# Patient Record
Sex: Male | Born: 1950 | Race: White | Hispanic: No | Marital: Married | Smoking: Former smoker
Health system: Southern US, Community
[De-identification: ages and names within clinical notes are randomized; demographics above are authoritative.]

## PROBLEM LIST (undated history)

## (undated) DIAGNOSIS — J189 Pneumonia, unspecified organism: Secondary | ICD-10-CM

## (undated) DIAGNOSIS — G4730 Sleep apnea, unspecified: Secondary | ICD-10-CM

## (undated) DIAGNOSIS — I714 Abdominal aortic aneurysm, without rupture, unspecified: Secondary | ICD-10-CM

## (undated) DIAGNOSIS — E785 Hyperlipidemia, unspecified: Secondary | ICD-10-CM

## (undated) DIAGNOSIS — K219 Gastro-esophageal reflux disease without esophagitis: Secondary | ICD-10-CM

## (undated) DIAGNOSIS — M720 Palmar fascial fibromatosis [Dupuytren]: Secondary | ICD-10-CM

## (undated) DIAGNOSIS — G43909 Migraine, unspecified, not intractable, without status migrainosus: Secondary | ICD-10-CM

## (undated) DIAGNOSIS — M5030 Other cervical disc degeneration, unspecified cervical region: Secondary | ICD-10-CM

## (undated) DIAGNOSIS — I10 Essential (primary) hypertension: Secondary | ICD-10-CM

## (undated) DIAGNOSIS — M5020 Other cervical disc displacement, unspecified cervical region: Secondary | ICD-10-CM

## (undated) DIAGNOSIS — Z860100 Personal history of colon polyps, unspecified: Secondary | ICD-10-CM

## (undated) DIAGNOSIS — F419 Anxiety disorder, unspecified: Secondary | ICD-10-CM

## (undated) DIAGNOSIS — M1990 Unspecified osteoarthritis, unspecified site: Secondary | ICD-10-CM

## (undated) HISTORY — DX: Pneumonia, unspecified organism: J18.9

## (undated) HISTORY — DX: Sleep apnea, unspecified: G47.30

## (undated) HISTORY — PX: OTHER SURGICAL HISTORY: SHX169

## (undated) HISTORY — DX: Essential (primary) hypertension: I10

## (undated) HISTORY — DX: Personal history of colon polyps, unspecified: Z86.0100

## (undated) HISTORY — DX: Hyperlipidemia, unspecified: E78.5

## (undated) HISTORY — DX: Palmar fascial fibromatosis (dupuytren): M72.0

## (undated) HISTORY — DX: Abdominal aortic aneurysm, without rupture, unspecified: I71.40

## (undated) HISTORY — DX: Anxiety disorder, unspecified: F41.9

## (undated) HISTORY — DX: Unspecified osteoarthritis, unspecified site: M19.90

## (undated) HISTORY — DX: Migraine, unspecified, not intractable, without status migrainosus: G43.909

## (undated) HISTORY — DX: Personal history of colonic polyps: Z86.010

## (undated) HISTORY — DX: Abdominal aortic aneurysm, without rupture: I71.4

## (undated) HISTORY — DX: Gastro-esophageal reflux disease without esophagitis: K21.9

---

## 2001-05-04 ENCOUNTER — Encounter: Payer: Self-pay | Admitting: Internal Medicine

## 2001-05-04 ENCOUNTER — Encounter: Admission: RE | Admit: 2001-05-04 | Discharge: 2001-05-04 | Payer: Self-pay | Admitting: Internal Medicine

## 2005-11-17 ENCOUNTER — Encounter: Admission: RE | Admit: 2005-11-17 | Discharge: 2005-11-17 | Payer: Self-pay | Admitting: Internal Medicine

## 2014-05-24 ENCOUNTER — Other Ambulatory Visit: Payer: Self-pay | Admitting: Internal Medicine

## 2014-05-24 DIAGNOSIS — R1031 Right lower quadrant pain: Secondary | ICD-10-CM

## 2014-05-25 ENCOUNTER — Ambulatory Visit
Admission: RE | Admit: 2014-05-25 | Discharge: 2014-05-25 | Disposition: A | Discharge status: Home / Self Care | Payer: BC Managed Care – PPO | Source: Ambulatory Visit | Attending: Internal Medicine | Admitting: Internal Medicine

## 2014-05-25 DIAGNOSIS — R1031 Right lower quadrant pain: Secondary | ICD-10-CM

## 2014-05-25 MED ORDER — IOHEXOL 300 MG/ML  SOLN
125.0000 mL | Freq: Once | INTRAMUSCULAR | Status: AC | PRN
  Administered 2014-05-25: 125 mL via INTRAVENOUS

## 2015-04-29 DIAGNOSIS — Z6829 Body mass index (BMI) 29.0-29.9, adult: Secondary | ICD-10-CM | POA: Diagnosis not present

## 2015-04-29 DIAGNOSIS — K219 Gastro-esophageal reflux disease without esophagitis: Secondary | ICD-10-CM | POA: Insufficient documentation

## 2015-04-29 DIAGNOSIS — M720 Palmar fascial fibromatosis [Dupuytren]: Secondary | ICD-10-CM | POA: Diagnosis not present

## 2015-04-29 DIAGNOSIS — G43909 Migraine, unspecified, not intractable, without status migrainosus: Secondary | ICD-10-CM | POA: Insufficient documentation

## 2015-04-29 DIAGNOSIS — I714 Abdominal aortic aneurysm, without rupture, unspecified: Secondary | ICD-10-CM | POA: Insufficient documentation

## 2015-04-29 DIAGNOSIS — I10 Essential (primary) hypertension: Secondary | ICD-10-CM | POA: Insufficient documentation

## 2015-04-29 DIAGNOSIS — Z1389 Encounter for screening for other disorder: Secondary | ICD-10-CM | POA: Diagnosis not present

## 2015-04-29 DIAGNOSIS — E785 Hyperlipidemia, unspecified: Secondary | ICD-10-CM | POA: Insufficient documentation

## 2015-05-30 DIAGNOSIS — I714 Abdominal aortic aneurysm, without rupture: Secondary | ICD-10-CM | POA: Diagnosis not present

## 2015-06-12 ENCOUNTER — Encounter: Payer: Self-pay | Admitting: Surgery

## 2015-06-12 DIAGNOSIS — E785 Hyperlipidemia, unspecified: Secondary | ICD-10-CM | POA: Diagnosis not present

## 2015-06-12 DIAGNOSIS — Z23 Encounter for immunization: Secondary | ICD-10-CM | POA: Diagnosis not present

## 2015-07-10 ENCOUNTER — Encounter: Payer: Self-pay | Admitting: Surgery

## 2015-07-15 ENCOUNTER — Ambulatory Visit (INDEPENDENT_AMBULATORY_CARE_PROVIDER_SITE_OTHER): Payer: Medicare Other | Admitting: Surgery

## 2015-07-15 ENCOUNTER — Encounter: Payer: Self-pay | Admitting: Surgery

## 2015-07-15 VITALS — BP 161/94 | HR 66 | Wt 210.9 lb

## 2015-07-15 DIAGNOSIS — I714 Abdominal aortic aneurysm, without rupture, unspecified: Secondary | ICD-10-CM

## 2015-07-15 NOTE — Addendum Note (Signed)
Addended by: Dorthula Rue L on: 07/15/2015 11:05 AM   Modules accepted: Orders

## 2015-07-15 NOTE — Progress Notes (Signed)
Patient name: Keith Hernandez MRN: [id number] DOB: [birth year] Sex: male   Referred by: Dr. Ardeth Perfect  Reason for referral:  Chief Complaint  Patient presents with  . New Evaluation    eval AAA     HISTORY OF PRESENT ILLNESS: This is a very pleasant 64 year old gentleman who comes in today for evaluation of an abdominal aortic aneurysm.  This was initially detected in 2007 with a CT angiogram.  He had a follow-up CT scan in 2015.  This was done for abdominal pain and showed that the aneurysm was 2.9 cm.  He recently had an ultrasound which showed the aneurysm had grown 1 cm and now measured 3.9 cm.  The patient denies any abdominal pain.  He does not have a family history of premature cardiovascular disease.  The patient suffers from hypercholesterolemia.  He takes a statin 3 days a week.  He has decreased his statin dose because of leg weakness when he was taking it daily.  He suffers from hypertension, and particular whitecoat hypertension.  He is on an ACE inhibitor.  He has a record of his blood pressure which shows an average around 130/80.  He also takes a daily aspirin and multivitamin.  Past Medical History  Diagnosis Date  . AAA (abdominal aortic aneurysm) ([place])   . Hypertension   . Migraines   . GERD (gastroesophageal reflux disease)   . Dupuytren's disease   . Hyperlipidemia     Past Surgical History  Procedure Laterality Date  . Achilles reattachment      Social History   Social History  . Marital Status: Married    Spouse Name: N/A  . Number of Children: N/A  . Years of Education: N/A   Occupational History  . Not on file.   Social History Main Topics  . Smoking status: Former Smoker -- 1.00 packs/day    Types: Cigarettes    Quit date: 07/15/1999  . Smokeless tobacco: Never Used  . Alcohol Use: 4.2 oz/week    2 Shots of liquor, 5 Glasses of wine per week  . Drug Use: Not on file  . Sexual Activity: Not on file   Other Topics Concern  . Not on file     Social History Narrative    Family History  Problem Relation Age of Onset  . Arthritis Mother   . Heart disease Mother   . Heart attack Mother   . Asthma Father   . Asthma Paternal Grandfather     Allergies as of 07/15/2015  . (No Known Allergies)    Current Outpatient Prescriptions on File Prior to Visit  Medication Sig Dispense Refill  . atorvastatin (LIPITOR) 10 MG tablet Take 10 mg by mouth daily.    Marland Kitchen losartan (COZAAR) 50 MG tablet Take 50 mg by mouth daily.    . Multiple Vitamin (MULTIVITAMIN) tablet Take 1 tablet by mouth daily.     No current facility-administered medications on file prior to visit.     REVIEW OF SYSTEMS: Cardiovascular: No chest pain, chest pressure, palpitations, orthopnea, or dyspnea on exertion. No claudication or rest pain,  No history of DVT or phlebitis. Pulmonary: No productive cough, asthma or wheezing. Neurologic: No weakness, paresthesias, aphasia, or amaurosis. No dizziness. Hematologic: No bleeding problems or clotting disorders. Musculoskeletal: No joint pain or joint swelling. Gastrointestinal: No blood in stool or hematemesis Genitourinary: No dysuria or hematuria. Psychiatric:: No history of major depression. Integumentary: No rashes or ulcers. Constitutional: No fever or chills.  PHYSICAL EXAMINATION:  Filed Vitals:   07/15/15 0854 07/15/15 0903  BP: 169/87 161/94  Pulse: 66   Weight: 210 lb 14.4 oz (95.664 kg)   SpO2: 99%    There is no height on file to calculate BMI. General: The patient appears their stated age.   HEENT:  No gross abnormalities Pulmonary: Respirations are non-labored Abdomen: Soft and non-tender.  No pulsatile mass  Musculoskeletal: There are no major deformities.   Neurologic: No focal weakness or paresthesias are detected, Skin: There are no ulcer or rashes noted. Psychiatric: The patient has normal affect. Cardiovascular: There is a regular rate and rhythm without significant murmur  appreciated.  Palpable pedal pulses.  Palpable left popliteal pulse.  No carotid bruits  Diagnostic Studies: I have reviewed his CT scan from 2015 which shows a 2.9 cm abdominal aortic aneurysm at the aortic bifurcation.  No significant iliac ectasia or aneurysmal degeneration. Most recent ultrasound in 2016 shows an aneurysm of 3.9 cm   Assessment:  Abdominal aortic aneurysm Plan: We discussed the natural history of abdominal aortic aneurysmal disease.  We also discussed indications for repair.  I suspect that the most recent ultrasound was not entirely accurate.  He states that he was not nothing by mouth for the study and it was a difficult study.  I would like to repeat this in 6 months to make sure that it did not grow 1 cm a year.  He'll follow with me in 6 months with a repeat ultrasound.  He has no activity restrictions.  He is cleared for hand surgery when he elects to get this done at the beginning of the year.     Eldridge Abrahams, M.D. [facility] Office: [phone] Pager:  [phone]

## 2015-09-20 DIAGNOSIS — D485 Neoplasm of uncertain behavior of skin: Secondary | ICD-10-CM | POA: Diagnosis not present

## 2015-09-20 DIAGNOSIS — L989 Disorder of the skin and subcutaneous tissue, unspecified: Secondary | ICD-10-CM | POA: Insufficient documentation

## 2015-09-26 ENCOUNTER — Other Ambulatory Visit: Payer: Self-pay | Admitting: Orthopedic Surgery

## 2015-10-01 DIAGNOSIS — D230 Other benign neoplasm of skin of lip: Secondary | ICD-10-CM | POA: Diagnosis not present

## 2015-10-02 ENCOUNTER — Encounter (HOSPITAL_BASED_OUTPATIENT_CLINIC_OR_DEPARTMENT_OTHER): Payer: Self-pay

## 2015-10-02 ENCOUNTER — Ambulatory Visit (HOSPITAL_BASED_OUTPATIENT_CLINIC_OR_DEPARTMENT_OTHER): Admit: 2015-10-02 | Payer: Medicare Other | Admitting: Plastic Surgery

## 2015-10-02 SURGERY — EXCISION LIPOMA
Anesthesia: General | Site: Face

## 2015-10-03 ENCOUNTER — Encounter (HOSPITAL_BASED_OUTPATIENT_CLINIC_OR_DEPARTMENT_OTHER): Payer: Self-pay | Admitting: *Deleted

## 2015-10-03 NOTE — Pre-Procedure Instructions (Signed)
Vascular notes reviewed with Dr. Marin Comment. No further eval or testing needed. OK for surgery 10/11/15.

## 2015-10-11 ENCOUNTER — Ambulatory Visit (HOSPITAL_BASED_OUTPATIENT_CLINIC_OR_DEPARTMENT_OTHER)
Admission: RE | Admit: 2015-10-11 | Discharge: 2015-10-11 | Disposition: A | Discharge status: Home / Self Care | Payer: Medicare Other | Source: Ambulatory Visit | Attending: Orthopedic Surgery | Admitting: Orthopedic Surgery

## 2015-10-11 ENCOUNTER — Encounter (HOSPITAL_BASED_OUTPATIENT_CLINIC_OR_DEPARTMENT_OTHER): Payer: Self-pay | Admitting: Certified Registered"

## 2015-10-11 ENCOUNTER — Ambulatory Visit (HOSPITAL_BASED_OUTPATIENT_CLINIC_OR_DEPARTMENT_OTHER): Payer: Medicare Other | Admitting: Certified Registered"

## 2015-10-11 ENCOUNTER — Encounter (HOSPITAL_BASED_OUTPATIENT_CLINIC_OR_DEPARTMENT_OTHER)
Admission: RE | Disposition: A | Discharge status: Home / Self Care | Payer: Self-pay | Source: Ambulatory Visit | Attending: Orthopedic Surgery

## 2015-10-11 DIAGNOSIS — Z87891 Personal history of nicotine dependence: Secondary | ICD-10-CM | POA: Insufficient documentation

## 2015-10-11 DIAGNOSIS — M79642 Pain in left hand: Secondary | ICD-10-CM | POA: Diagnosis not present

## 2015-10-11 DIAGNOSIS — I10 Essential (primary) hypertension: Secondary | ICD-10-CM | POA: Diagnosis not present

## 2015-10-11 DIAGNOSIS — G8918 Other acute postprocedural pain: Secondary | ICD-10-CM | POA: Diagnosis not present

## 2015-10-11 DIAGNOSIS — M720 Palmar fascial fibromatosis [Dupuytren]: Secondary | ICD-10-CM | POA: Insufficient documentation

## 2015-10-11 DIAGNOSIS — E785 Hyperlipidemia, unspecified: Secondary | ICD-10-CM | POA: Insufficient documentation

## 2015-10-11 DIAGNOSIS — Z7982 Long term (current) use of aspirin: Secondary | ICD-10-CM | POA: Insufficient documentation

## 2015-10-11 DIAGNOSIS — K219 Gastro-esophageal reflux disease without esophagitis: Secondary | ICD-10-CM | POA: Diagnosis not present

## 2015-10-11 HISTORY — PX: FASCIOTOMY: SHX132

## 2015-10-11 SURGERY — FASCIOTOMY, UPPER EXTREMITY
Anesthesia: General | Site: Hand | Laterality: Left

## 2015-10-11 MED ORDER — FENTANYL CITRATE (PF) 100 MCG/2ML IJ SOLN
50.0000 ug | INTRAMUSCULAR | Status: DC | PRN
  Administered 2015-10-11: 50 ug via INTRAVENOUS

## 2015-10-11 MED ORDER — HYDROMORPHONE HCL 1 MG/ML IJ SOLN: 0.2500 mg | INTRAMUSCULAR | Status: DC | PRN

## 2015-10-11 MED ORDER — GLYCOPYRROLATE 0.2 MG/ML IJ SOLN: 0.2000 mg | Freq: Once | INTRAMUSCULAR | Status: DC | PRN

## 2015-10-11 MED ORDER — SCOPOLAMINE 1 MG/3DAYS TD PT72: 1.0000 | MEDICATED_PATCH | Freq: Once | TRANSDERMAL | Status: DC | PRN

## 2015-10-11 MED ORDER — CEFAZOLIN SODIUM-DEXTROSE 2-3 GM-% IV SOLR
2.0000 g | INTRAVENOUS | Status: AC
  Administered 2015-10-11: 2 g via INTRAVENOUS

## 2015-10-11 MED ORDER — ONDANSETRON HCL 4 MG/2ML IJ SOLN
INTRAMUSCULAR | Status: AC
  Filled 2015-10-11: qty 2

## 2015-10-11 MED ORDER — OXYCODONE HCL 5 MG PO TABS: 10.0000 mg | ORAL_TABLET | ORAL | Status: DC | PRN

## 2015-10-11 MED ORDER — MIDAZOLAM HCL 2 MG/2ML IJ SOLN
INTRAMUSCULAR | Status: AC
  Filled 2015-10-11: qty 2

## 2015-10-11 MED ORDER — LIDOCAINE-EPINEPHRINE (PF) 1.5 %-1:200000 IJ SOLN
INTRAMUSCULAR | Status: DC | PRN
  Administered 2015-10-11: 15 mL via PERINEURAL

## 2015-10-11 MED ORDER — LIDOCAINE HCL (CARDIAC) 20 MG/ML IV SOLN
INTRAVENOUS | Status: DC | PRN
  Administered 2015-10-11: 30 mg via INTRAVENOUS

## 2015-10-11 MED ORDER — CEFAZOLIN SODIUM-DEXTROSE 2-3 GM-% IV SOLR
INTRAVENOUS | Status: AC
  Filled 2015-10-11: qty 50

## 2015-10-11 MED ORDER — BUPIVACAINE HCL (PF) 0.5 % IJ SOLN
INTRAMUSCULAR | Status: DC | PRN
  Administered 2015-10-11: 25 mL via PERINEURAL

## 2015-10-11 MED ORDER — PROMETHAZINE HCL 25 MG/ML IJ SOLN: 6.2500 mg | INTRAMUSCULAR | Status: DC | PRN

## 2015-10-11 MED ORDER — CHLORHEXIDINE GLUCONATE 4 % EX LIQD: 60.0000 mL | Freq: Once | CUTANEOUS | Status: DC

## 2015-10-11 MED ORDER — FENTANYL CITRATE (PF) 100 MCG/2ML IJ SOLN
INTRAMUSCULAR | Status: AC
  Filled 2015-10-11: qty 2

## 2015-10-11 MED ORDER — KETOROLAC TROMETHAMINE 30 MG/ML IJ SOLN: 30.0000 mg | Freq: Once | INTRAMUSCULAR | Status: DC | PRN

## 2015-10-11 MED ORDER — LACTATED RINGERS IV SOLN
INTRAVENOUS | Status: DC
  Administered 2015-10-11 (×2): via INTRAVENOUS

## 2015-10-11 MED ORDER — BETAMETHASONE SOD PHOS & ACET 6 (3-3) MG/ML IJ SUSP
INTRAMUSCULAR | Status: AC
  Filled 2015-10-11: qty 1

## 2015-10-11 MED ORDER — DEXAMETHASONE SODIUM PHOSPHATE 10 MG/ML IJ SOLN
INTRAMUSCULAR | Status: DC | PRN
  Administered 2015-10-11: 10 mg via INTRAVENOUS

## 2015-10-11 MED ORDER — CEPHALEXIN 500 MG PO CAPS: 500.0000 mg | ORAL_CAPSULE | Freq: Four times a day (QID) | ORAL | Status: DC

## 2015-10-11 MED ORDER — BUPIVACAINE HCL (PF) 0.25 % IJ SOLN
INTRAMUSCULAR | Status: DC | PRN
  Administered 2015-10-11: 1.5 mL

## 2015-10-11 MED ORDER — PROPOFOL 10 MG/ML IV BOLUS
INTRAVENOUS | Status: DC | PRN
  Administered 2015-10-11: 200 mg via INTRAVENOUS

## 2015-10-11 MED ORDER — LIDOCAINE HCL (CARDIAC) 20 MG/ML IV SOLN
INTRAVENOUS | Status: AC
  Filled 2015-10-11: qty 5

## 2015-10-11 MED ORDER — DEXAMETHASONE SODIUM PHOSPHATE 10 MG/ML IJ SOLN
INTRAMUSCULAR | Status: AC
  Filled 2015-10-11: qty 1

## 2015-10-11 MED ORDER — ONDANSETRON HCL 4 MG/2ML IJ SOLN
INTRAMUSCULAR | Status: DC | PRN
  Administered 2015-10-11: 4 mg via INTRAVENOUS

## 2015-10-11 MED ORDER — MIDAZOLAM HCL 2 MG/2ML IJ SOLN
1.0000 mg | INTRAMUSCULAR | Status: DC | PRN
  Administered 2015-10-11: 2 mg via INTRAVENOUS

## 2015-10-11 SURGICAL SUPPLY — 65 items
BANDAGE ACE 3X5.8 VEL STRL LF (GAUZE/BANDAGES/DRESSINGS) ×2 IMPLANT
BANDAGE COBAN STERILE 2 (GAUZE/BANDAGES/DRESSINGS) ×2 IMPLANT
BLADE CLIPPER SURG (BLADE) IMPLANT
BLADE MINI RND TIP GREEN BEAV (BLADE) IMPLANT
BLADE SURG 15 STRL LF DISP TIS (BLADE) ×2 IMPLANT
BLADE SURG 15 STRL SS (BLADE) ×4
BNDG CONFORM 2 STRL LF (GAUZE/BANDAGES/DRESSINGS) IMPLANT
BNDG CONFORM 3 STRL LF (GAUZE/BANDAGES/DRESSINGS) ×2 IMPLANT
BNDG GAUZE ELAST 4 BULKY (GAUZE/BANDAGES/DRESSINGS) ×2 IMPLANT
BRUSH SCRUB EZ PLAIN DRY (MISCELLANEOUS) ×2 IMPLANT
CANISTER SUCT 1200ML W/VALVE (MISCELLANEOUS) IMPLANT
CORDS BIPOLAR (ELECTRODE) ×2 IMPLANT
COVER BACK TABLE 60X90IN (DRAPES) ×2 IMPLANT
CUFF TOURNIQUET SINGLE 18IN (TOURNIQUET CUFF) IMPLANT
DECANTER SPIKE VIAL GLASS SM (MISCELLANEOUS) IMPLANT
DRAIN JACKSON RD 7FR 3/32 (WOUND CARE) IMPLANT
DRAPE EXTREMITY T 121X128X90 (DRAPE) ×2 IMPLANT
DRAPE SURG 17X23 STRL (DRAPES) ×2 IMPLANT
DRSG EMULSION OIL 3X3 NADH (GAUZE/BANDAGES/DRESSINGS) ×4 IMPLANT
EVACUATOR SILICONE 100CC (DRAIN) IMPLANT
GAUZE SPONGE 4X4 16PLY XRAY LF (GAUZE/BANDAGES/DRESSINGS) ×2 IMPLANT
GAUZE XEROFORM 1X8 LF (GAUZE/BANDAGES/DRESSINGS) ×2 IMPLANT
GLOVE BIOGEL M STRL SZ7.5 (GLOVE) IMPLANT
GLOVE SS BIOGEL STRL SZ 8 (GLOVE) ×1 IMPLANT
GLOVE SUPERSENSE BIOGEL SZ 8 (GLOVE) ×1
GOWN STRL REUS W/ TWL LRG LVL3 (GOWN DISPOSABLE) ×1 IMPLANT
GOWN STRL REUS W/ TWL XL LVL3 (GOWN DISPOSABLE) ×1 IMPLANT
GOWN STRL REUS W/TWL LRG LVL3 (GOWN DISPOSABLE) ×2
GOWN STRL REUS W/TWL XL LVL3 (GOWN DISPOSABLE) ×2
NDL HYPO 25X1 1.5 SAFETY (NEEDLE) IMPLANT
NEEDLE HYPO 22GX1.5 SAFETY (NEEDLE) IMPLANT
NEEDLE HYPO 25X1 1.5 SAFETY (NEEDLE) IMPLANT
NS IRRIG 1000ML POUR BTL (IV SOLUTION) ×2 IMPLANT
PACK BASIN DAY SURGERY FS (CUSTOM PROCEDURE TRAY) ×2 IMPLANT
PAD CAST 3X4 CTTN HI CHSV (CAST SUPPLIES) ×1 IMPLANT
PADDING CAST ABS 3INX4YD NS (CAST SUPPLIES) ×1
PADDING CAST ABS 4INX4YD NS (CAST SUPPLIES) ×1
PADDING CAST ABS COTTON 3X4 (CAST SUPPLIES) ×1 IMPLANT
PADDING CAST ABS COTTON 4X4 ST (CAST SUPPLIES) ×1 IMPLANT
PADDING CAST COTTON 3X4 STRL (CAST SUPPLIES) ×2
PASSER SUT SWANSON 36MM LOOP (INSTRUMENTS) ×2 IMPLANT
SHEET MEDIUM DRAPE 40X70 STRL (DRAPES) ×2 IMPLANT
SPLINT FIBERGLASS 3X35 (CAST SUPPLIES) IMPLANT
SPLINT PLASTER CAST XFAST 3X15 (CAST SUPPLIES) IMPLANT
SPLINT PLASTER XTRA FASTSET 3X (CAST SUPPLIES)
STOCKINETTE 4X48 STRL (DRAPES) ×2 IMPLANT
STOCKINETTE SYNTHETIC 3 UNSTER (CAST SUPPLIES) ×2 IMPLANT
SUCTION FRAZIER HANDLE 10FR (MISCELLANEOUS)
SUCTION TUBE FRAZIER 10FR DISP (MISCELLANEOUS) IMPLANT
SUT MERSILENE 4 0 P 3 (SUTURE) IMPLANT
SUT MERSILENE 5 0 P 3 (SUTURE) IMPLANT
SUT PROLENE 4 0 PS 2 18 (SUTURE) IMPLANT
SUT PROLENE 5 0 P 3 (SUTURE) IMPLANT
SUT VIC AB 4-0 P-3 18XBRD (SUTURE) IMPLANT
SUT VIC AB 4-0 P3 18 (SUTURE)
SYR BULB 3OZ (MISCELLANEOUS) ×2 IMPLANT
SYR CONTROL 10ML LL (SYRINGE) IMPLANT
SYSTEM CHEST DRAIN TLS 7FR (DRAIN) ×1 IMPLANT
TAPE SURG TRANSPORE 1 IN (GAUZE/BANDAGES/DRESSINGS) ×1 IMPLANT
TAPE SURGICAL TRANSPORE 1 IN (GAUZE/BANDAGES/DRESSINGS) ×1
TOWEL OR 17X24 6PK STRL BLUE (TOWEL DISPOSABLE) ×2 IMPLANT
TOWEL OR NON WOVEN STRL DISP B (DISPOSABLE) ×2 IMPLANT
TRAY DSU PREP LF (CUSTOM PROCEDURE TRAY) ×2 IMPLANT
TUBE CONNECTING 20X1/4 (TUBING) IMPLANT
UNDERPAD 30X30 (UNDERPADS AND DIAPERS) ×2 IMPLANT

## 2015-10-11 NOTE — Transfer of Care (Signed)
Immediate Anesthesia Transfer of Care Note  Patient: Keith Hernandez  Procedure(s) Performed: Procedure(s): LEFT HAND SUBTOTAL PALMAR FASCIOTOMY WITH REPAIR RECONSTRUCTION  (Left)  Patient Location: PACU  Anesthesia Type:GA combined with regional for post-op pain  Level of Consciousness: awake, alert , oriented and patient cooperative  Airway & Oxygen Therapy: Patient Spontanous Breathing and Patient connected to face mask oxygen  Post-op Assessment: Report given to RN and Post -op Vital signs reviewed and stable  Post vital signs: Reviewed and stable  Last Vitals:  Filed Vitals:   10/11/15 0835 10/11/15 0840  BP:    Pulse: 86 83  Temp:    Resp: 21 16    Complications: No apparent anesthesia complications

## 2015-10-11 NOTE — Anesthesia Procedure Notes (Addendum)
Anesthesia Regional Block:  Axillary brachial plexus block  Pre-Anesthetic Checklist: ,, timeout performed, Correct Patient, Correct Site, Correct Laterality, Correct Procedure, Correct Position, site marked, Risks and benefits discussed,  Surgical consent,  Pre-op evaluation,  At surgeon's request and post-op pain management  Laterality: Left  Prep: chloraprep       Needles:  Injection technique: Single-shot  Needle Type: Echogenic Stimulator Needle     Needle Length: 5cm 5 cm Needle Gauge: 22 and 22 G    Additional Needles:  Procedures: ultrasound guided (picture in chart) Axillary brachial plexus block  Nerve Stimulator or Paresthesia:  Response: median, 0.4 mA,  Response: ulnar, 0.8 mA,   Additional Responses:   Narrative:  Injection made incrementally with aspirations every 5 mL.  Performed by: Personally  Anesthesiologist: ROSE, GEORGE  Additional Notes: Patient tolerated the procedure well without complications   Procedure Name: LMA Insertion Date/Time: 10/11/2015 9:44 AM Performed by: Mechelle Pates D Pre-anesthesia Checklist: Patient identified, Emergency Drugs available, Suction available and Patient being monitored Patient Re-evaluated:Patient Re-evaluated prior to inductionOxygen Delivery Method: Circle System Utilized Preoxygenation: Pre-oxygenation with 100% oxygen Intubation Type: IV induction Ventilation: Mask ventilation without difficulty LMA: LMA inserted LMA Size: 5.0 Number of attempts: 1 Airway Equipment and Method: Bite block Placement Confirmation: positive ETCO2 Tube secured with: Tape Dental Injury: Teeth and Oropharynx as per pre-operative assessment

## 2015-10-11 NOTE — Discharge Instructions (Signed)
Please elevate keep your hand and bandage clean and dry. Call office will call for your follow-up. He'll see our therapist in 5-7 days. He'll see Dr. Amedeo Plenty in 12 days. Please call for any problems.  Your operation with great!!  Keep bandage clean and dry.  Call for any problems.  No smoking.  Criteria for driving a car: you should be off your pain medicine for 7-8 hours, able to drive one handed(confident), thinking clearly and feeling able in your judgement to drive. Continue elevation as it will decrease swelling.  If instructed by MD move your fingers within the confines of the bandage/splint.  Use ice if instructed by your MD. Call immediately for any sudden loss of feeling in your hand/arm or change in functional abilities of the extremity.We recommend that you to take vitamin C 1000 mg a day to promote healing. We also recommend that if you require  pain medicine that you take a stool softener to prevent constipation as most pain medicines will have constipation side effects. We recommend either Peri-Colace or Senokot and recommend that you also consider adding MiraLAX to prevent the constipation affects from pain medicine if you are required to use them. These medicines are over the counter and maybe purchased at a local pharmacy. A cup of yogurt and a probiotic can also be helpful during the recovery process as the medicines can disrupt your intestinal environment.        HAND SURGERY    HOME CARE INSTRUCTIONS    The following instructions have been prepared to help you care for yourself upon your return home today.  Wound Care:  Keep your hand elevated above the level of your heart. Do not allow it to dangle by your side. Keep the dressing dry and do not remove it unless your doctor advises you to do so. He will usually change it at the time of you post-op visit. Moving your fingers is advised to stimulate circulation but will depend on the site of your surgery. Of course, if you have a  splint applied your doctor will advise you about movement.  Activity:  Do not drive or operate machinery today. Rest today and then you may return to your normal activity and work as indicated by your physician.  Diet: Drink liquids today or eat a light diet. You may resume a regular diet tomorrow.  General expectations: Pain for two or three days. Fingers may become slightly swollen.   Unexpected Observations- Call your doctor if any of these occur: Severe pain not relieved by pain medication. Elevated temperature. Dressing soaked with blood. Inability to move fingers. White or bluish color to fingers.   Post Anesthesia Home Care Instructions  Activity: Get plenty of rest for the remainder of the day. A responsible adult should stay with you for 24 hours following the procedure.  For the next 24 hours, DO NOT: -Drive a car -Paediatric nurse -Drink alcoholic beverages -Take any medication unless instructed by your physician -Make any legal decisions or sign important papers.  Meals: Start with liquid foods such as gelatin or soup. Progress to regular foods as tolerated. Avoid greasy, spicy, heavy foods. If nausea and/or vomiting occur, drink only clear liquids until the nausea and/or vomiting subsides. Call your physician if vomiting continues.  Special Instructions/Symptoms: Your throat may feel dry or sore from the anesthesia or the breathing tube placed in your throat during surgery. If this causes discomfort, gargle with warm salt water. The discomfort should disappear within 24  hours.  If you had a scopolamine patch placed behind your ear for the management of post- operative nausea and/or vomiting:  1. The medication in the patch is effective for 72 hours, after which it should be removed.  Wrap patch in a tissue and discard in the trash. Wash hands thoroughly with soap and water. 2. You may remove the patch earlier than 72 hours if you experience unpleasant side  effects which may include dry mouth, dizziness or visual disturbances. 3. Avoid touching the patch. Wash your hands with soap and water after contact with the patch.

## 2015-10-11 NOTE — Progress Notes (Signed)
Assisted Dr. Rose with left, axillary block. Side rails up, monitors on throughout procedure. See vital signs in flow sheet. Tolerated Procedure well. 

## 2015-10-11 NOTE — H&P (Signed)
Keith Hernandez is an 65 y.o. male.   Chief Complaint: Dupuytren's disease left hand with involvement of the ring and small finger HPI: Patient presents for Dupuytren's release and subtotal palmar fasciectomy left hand Patient presents for evaluation and treatment of the of their upper extremity predicament. The patient denies neck, back, chest or  abdominal pain. The patient notes that they have no lower extremity problems. The patients primary complaint is noted. We are planning surgical care pathway for the upper extremity. Past Medical History  Diagnosis Date  . AAA (abdominal aortic aneurysm) ([place])   . Hypertension   . Migraines   . GERD (gastroesophageal reflux disease)   . Dupuytren's disease   . Hyperlipidemia     Past Surgical History  Procedure Laterality Date  . Achilles reattachment      Family History  Problem Relation Age of Onset  . Arthritis Mother   . Heart disease Mother   . Heart attack Mother   . Asthma Father   . Asthma Paternal Grandfather    Social History:  reports that he quit smoking about 16 years ago. His smoking use included Cigarettes. He smoked 1.00 pack per day. He has never used smokeless tobacco. He reports that he drinks about 4.2 oz of alcohol per week. His drug history is not on file.  Allergies: No Known Allergies  Medications Prior to Admission  Medication Sig Dispense Refill  . aspirin 81 MG tablet Take 81 mg by mouth daily.    Marland Kitchen atorvastatin (LIPITOR) 10 MG tablet Take 10 mg by mouth daily.    Marland Kitchen losartan (COZAAR) 50 MG tablet Take 50 mg by mouth daily.    . Multiple Vitamin (MULTIVITAMIN) tablet Take 1 tablet by mouth daily.      No results found for this or any previous visit (from the past 48 hour(s)). No results found.  Review of Systems  Eyes: Negative.   Respiratory: Negative.   Cardiovascular: Negative.   Gastrointestinal: Negative.   Genitourinary: Negative.   Neurological: Negative.     Blood pressure 144/88, pulse 83,  temperature 98 F (36.7 C), temperature source Oral, resp. rate 16, height 6' (1.829 m), weight 94.915 kg (209 lb 4 oz), SpO2 100 %. Physical Exam  Dupuytren's disease left hand balding small and ring finger with contracture The patient is alert and oriented in no acute distress. The patient complains of pain in the affected upper extremity.  The patient is noted to have a normal HEENT exam. Lung fields show equal chest expansion and no shortness of breath. Abdomen exam is nontender without distention. Lower extremity examination does not show any fracture dislocation or blood clot symptoms. Pelvis is stable and the neck and back are stable and nontender. Assessment/Plan Plan left hand deep to arrange release with subtotal palmar fasciectomy We are planning surgery for your upper extremity. The risk and benefits of surgery to include risk of bleeding, infection, anesthesia,  damage to normal structures and failure of the surgery to accomplish its intended goals of relieving symptoms and restoring function have been discussed in detail. With this in mind we plan to proceed. I have specifically discussed with the patient the pre-and postoperative regime and the dos and don'ts and risk and benefits in great detail. Risk and benefits of surgery also include risk of dystrophy(CRPS), chronic nerve pain, failure of the healing process to go onto completion and other inherent risks of surgery The relavent the pathophysiology of the disease/injury process, as well as the  alternatives for treatment and postoperative course of action has been discussed in great detail with the patient who desires to proceed.  We will do everything in our power to help you (the patient) restore function to the upper extremity. It is a pleasure to see this patient today.  Paulene Floor 10/11/2015, 9:42 AM

## 2015-10-11 NOTE — Anesthesia Preprocedure Evaluation (Addendum)
Anesthesia Evaluation  Patient identified by MRN, date of birth, ID band Patient awake    Reviewed: Allergy & Precautions, NPO status , Patient's Chart, lab work & pertinent test results  Airway Mallampati: II  TM Distance: >3 FB Neck ROM: Full    Dental no notable dental hx.    Pulmonary neg pulmonary ROS, former smoker,    Pulmonary exam normal breath sounds clear to auscultation       Cardiovascular hypertension, Pt. on medications + Peripheral Vascular Disease  Normal cardiovascular exam Rhythm:Regular Rate:Normal  3.9 cm distal abdominal aneurysm    Neuro/Psych  Headaches, negative neurological ROS  negative psych ROS   GI/Hepatic Neg liver ROS, GERD  Medicated,  Endo/Other  negative endocrine ROS  Renal/GU negative Renal ROS  negative genitourinary   Musculoskeletal negative musculoskeletal ROS (+)   Abdominal   Peds negative pediatric ROS (+)  Hematology negative hematology ROS (+)   Anesthesia Other Findings   Reproductive/Obstetrics negative OB ROS                           Anesthesia Physical Anesthesia Plan  ASA: III  Anesthesia Plan: General   Post-op Pain Management: GA combined w/ Regional for post-op pain   Induction: Intravenous  Airway Management Planned: LMA  Additional Equipment:   Intra-op Plan:   Post-operative Plan:   Informed Consent: I have reviewed the patients History and Physical, chart, labs and discussed the procedure including the risks, benefits and alternatives for the proposed anesthesia with the patient or authorized representative who has indicated his/her understanding and acceptance.   Dental advisory given  Plan Discussed with: CRNA and Surgeon  Anesthesia Plan Comments:        Anesthesia Quick Evaluation

## 2015-10-11 NOTE — Op Note (Signed)
See dictation [place].

## 2015-10-11 NOTE — Anesthesia Postprocedure Evaluation (Signed)
Anesthesia Post Note  Patient: Keith Hernandez  Procedure(s) Performed: Procedure(s) (LRB): LEFT HAND SUBTOTAL PALMAR FASCIOTOMY WITH REPAIR RECONSTRUCTION  (Left)  Patient location during evaluation: PACU Anesthesia Type: General and Regional Level of consciousness: awake and alert Pain management: pain level controlled Vital Signs Assessment: post-procedure vital signs reviewed and stable Respiratory status: spontaneous breathing, nonlabored ventilation, respiratory function stable and patient connected to nasal cannula oxygen Cardiovascular status: blood pressure returned to baseline and stable Postop Assessment: no signs of nausea or vomiting Anesthetic complications: no    Last Vitals:  Filed Vitals:   10/11/15 1145 10/11/15 1200  BP: 150/100 156/90  Pulse: 94 86  Temp:  36.4 C  Resp: 18 18    Last Pain:  Filed Vitals:   10/11/15 1209  PainSc: 0-No pain                 Talana Slatten S

## 2015-10-14 ENCOUNTER — Encounter (HOSPITAL_BASED_OUTPATIENT_CLINIC_OR_DEPARTMENT_OTHER): Payer: Self-pay | Admitting: Orthopedic Surgery

## 2015-10-14 NOTE — Op Note (Signed)
NAME:  Keith Hernandez, PLUMBER NO.:  [id number]  MEDICAL RECORD NO.:  [id number]  LOCATION:                                 FACILITY:  PHYSICIAN:  Satira Anis. Amedeo Plenty, M.D.     DATE OF BIRTH:  DATE OF PROCEDURE:  10/11/2015 DATE OF DISCHARGE:                              OPERATIVE REPORT   PREOPERATIVE DIAGNOSIS:  Left hand advanced Dupuytren's contracture involving the ring and small fingers.  POSTOPERATIVE DIAGNOSIS:  Left hand advanced Dupuytren's contracture involving the ring and small fingers.  PROCEDURE: 1. Subtotal palmar fasciectomy, right small finger and palm. 2. Ulnar digital nerve neurolysis, extensive in nature, left small     finger. 3. Radial digital nerve neurolysis, extensive in nature, left small     finger. 4. Subtotal palmar fasciectomy, ring finger and palm, left hand.  SURGEON:  Satira Anis. Amedeo Plenty, MD  ASSISTANT:  None.  COMPLICATIONS:  None.  ANESTHESIA:  General, preoperative block.  TOURNIQUET TIME:  Less than an hour.  DRAINS:  One TLS 7 drain.  INDICATIONS:  Patient is a pleasant 65 year old male now presents with the above-mentioned diagnosis.  I have counseled him in regard to risks and benefits of surgery and he desires to proceed.  All questions have been encouraged, answered preoperatively.  OPERATIVE PROCEDURE:  The patient was seen by myself and Anesthesia, taken to operative suite, underwent smooth induction with general anesthetic, prepped and draped in usual sterile fashion with Betadine scrub and paint.  SCDs were placed.  Body parts well padded.  Preop time- out was accomplished and observed and preoperative antibiotics were given.  Following this, modified Bruner incision was made from the area distal to the PIP joint, a small finger proximally incorporated in an open McCash limb as the patient had severe dimpling about the palmar aspect of his hand.  Skin flaps were gently elevated.  Very careful tangential sweep  to the knife blade.  Under 4.0 loupe magnification, the skin flaps were elevated.  At this time, I identified the neurovascular bundles.  The radial and ulnar neurovascular bundles were identified with her concomitant arterial branches.  I performed a neurolysis of the radial and ulnar digital nerves, extensive in nature.  I removed the spinal cord and the natatory cords and this entangled the neurovascular bundles from the disease process.  Following this, the pretendinous cord, natatory cord, and spiral cords were removed in their entirety and sent for specimen.  Following this, I then dissected radially and identified the pretendinous cord to the ring finger, this was a less involved contracture and it was removed in its entirety very carefully protecting neurovascular structures.  I did not have to perform any major neurolysis of the nerves in the ring finger.  Following this, I deflated the tourniquet.  Hemostasis was secured with bipolar electrocautery as needed.  Drain was placed in the form of TLS drain.  The wounds were closed with a horizontal mattress through 4-0 Prolene stitch.  The patient tolerated this well and had excellent refill, no complicating features.  Standard Dupuytren's dressing and splint were applied.  He will see [place] in 5-7 days and begin therapeutic endeavors  in our therapy office.  He will see me formally in 12 days.  These notes discussed.  Keflex, oxycodone antibiotic prophylaxis and pain control.  Pleasure to see him today.  Do's and don'ts discussed and all questions addressed.     Satira Anis. Amedeo Plenty, M.D.     [facility]  D:  10/11/2015  T:  10/11/2015  Job:  [id number]

## 2015-10-25 DIAGNOSIS — M720 Palmar fascial fibromatosis [Dupuytren]: Secondary | ICD-10-CM | POA: Diagnosis not present

## 2015-10-25 DIAGNOSIS — Z4789 Encounter for other orthopedic aftercare: Secondary | ICD-10-CM | POA: Diagnosis not present

## 2015-10-30 DIAGNOSIS — M720 Palmar fascial fibromatosis [Dupuytren]: Secondary | ICD-10-CM | POA: Diagnosis not present

## 2015-11-06 DIAGNOSIS — M720 Palmar fascial fibromatosis [Dupuytren]: Secondary | ICD-10-CM | POA: Diagnosis not present

## 2015-11-13 DIAGNOSIS — M720 Palmar fascial fibromatosis [Dupuytren]: Secondary | ICD-10-CM | POA: Diagnosis not present

## 2015-11-13 DIAGNOSIS — Z4789 Encounter for other orthopedic aftercare: Secondary | ICD-10-CM | POA: Diagnosis not present

## 2015-11-22 ENCOUNTER — Telehealth: Payer: Self-pay | Admitting: Internal Medicine

## 2015-11-22 NOTE — Telephone Encounter (Signed)
Called and spoke to pt. Pt is requesting Consult appt with CY for cough and chest congestion x 2 weeks. Pt is requesting an appt with CY within the next 2 weeks if possible.   Spoke with CY and was advised that he would be best worked in within another provider as the pt would be seen sooner if by another provider.   Called and spoke to pt. Informed him of the recs per CY. Appt made with MW on 3.13.17. Pt verbalized understanding and denied any further questions or concerns at this time.

## 2015-11-25 ENCOUNTER — Institutional Professional Consult (permissible substitution): Payer: Medicare Other | Admitting: Internal Medicine

## 2015-11-26 DIAGNOSIS — H6981 Other specified disorders of Eustachian tube, right ear: Secondary | ICD-10-CM | POA: Insufficient documentation

## 2015-11-26 DIAGNOSIS — J302 Other seasonal allergic rhinitis: Secondary | ICD-10-CM | POA: Insufficient documentation

## 2015-11-26 DIAGNOSIS — H9313 Tinnitus, bilateral: Secondary | ICD-10-CM | POA: Diagnosis not present

## 2015-12-30 DIAGNOSIS — Z6829 Body mass index (BMI) 29.0-29.9, adult: Secondary | ICD-10-CM | POA: Diagnosis not present

## 2015-12-30 DIAGNOSIS — J302 Other seasonal allergic rhinitis: Secondary | ICD-10-CM | POA: Diagnosis not present

## 2015-12-30 DIAGNOSIS — J45998 Other asthma: Secondary | ICD-10-CM | POA: Diagnosis not present

## 2015-12-30 DIAGNOSIS — J3489 Other specified disorders of nose and nasal sinuses: Secondary | ICD-10-CM | POA: Diagnosis not present

## 2016-01-01 DIAGNOSIS — E784 Other hyperlipidemia: Secondary | ICD-10-CM | POA: Diagnosis not present

## 2016-01-01 DIAGNOSIS — I10 Essential (primary) hypertension: Secondary | ICD-10-CM | POA: Diagnosis not present

## 2016-01-01 DIAGNOSIS — Z125 Encounter for screening for malignant neoplasm of prostate: Secondary | ICD-10-CM | POA: Diagnosis not present

## 2016-01-07 ENCOUNTER — Encounter: Payer: Self-pay | Admitting: Surgery

## 2016-01-08 DIAGNOSIS — Z1159 Encounter for screening for other viral diseases: Secondary | ICD-10-CM | POA: Diagnosis not present

## 2016-01-08 DIAGNOSIS — G43909 Migraine, unspecified, not intractable, without status migrainosus: Secondary | ICD-10-CM | POA: Diagnosis not present

## 2016-01-08 DIAGNOSIS — I714 Abdominal aortic aneurysm, without rupture: Secondary | ICD-10-CM | POA: Diagnosis not present

## 2016-01-08 DIAGNOSIS — K219 Gastro-esophageal reflux disease without esophagitis: Secondary | ICD-10-CM | POA: Diagnosis not present

## 2016-01-08 DIAGNOSIS — I10 Essential (primary) hypertension: Secondary | ICD-10-CM | POA: Diagnosis not present

## 2016-01-08 DIAGNOSIS — Z0000 Encounter for general adult medical examination without abnormal findings: Secondary | ICD-10-CM | POA: Diagnosis not present

## 2016-01-08 DIAGNOSIS — J329 Chronic sinusitis, unspecified: Secondary | ICD-10-CM | POA: Diagnosis not present

## 2016-01-08 DIAGNOSIS — Z23 Encounter for immunization: Secondary | ICD-10-CM | POA: Diagnosis not present

## 2016-01-08 DIAGNOSIS — Z6829 Body mass index (BMI) 29.0-29.9, adult: Secondary | ICD-10-CM | POA: Diagnosis not present

## 2016-01-08 DIAGNOSIS — E784 Other hyperlipidemia: Secondary | ICD-10-CM | POA: Diagnosis not present

## 2016-01-13 ENCOUNTER — Ambulatory Visit (HOSPITAL_COMMUNITY)
Admission: RE | Admit: 2016-01-13 | Discharge: 2016-01-13 | Disposition: A | Discharge status: Home / Self Care | Payer: Medicare Other | Source: Ambulatory Visit | Attending: Surgery | Admitting: Surgery

## 2016-01-13 ENCOUNTER — Encounter: Payer: Self-pay | Admitting: Surgery

## 2016-01-13 ENCOUNTER — Ambulatory Visit (INDEPENDENT_AMBULATORY_CARE_PROVIDER_SITE_OTHER): Payer: Medicare Other | Admitting: Surgery

## 2016-01-13 VITALS — BP 148/100 | HR 69 | Ht 72.0 in | Wt 213.7 lb

## 2016-01-13 DIAGNOSIS — E785 Hyperlipidemia, unspecified: Secondary | ICD-10-CM | POA: Insufficient documentation

## 2016-01-13 DIAGNOSIS — I714 Abdominal aortic aneurysm, without rupture, unspecified: Secondary | ICD-10-CM

## 2016-01-13 DIAGNOSIS — K219 Gastro-esophageal reflux disease without esophagitis: Secondary | ICD-10-CM | POA: Diagnosis not present

## 2016-01-13 DIAGNOSIS — I10 Essential (primary) hypertension: Secondary | ICD-10-CM | POA: Insufficient documentation

## 2016-01-13 NOTE — Progress Notes (Signed)
   Patient name: Keith Hernandez MRN: [id number] DOB: 07/20/1951 Sex: male  REASON FOR VISIT: follow up  HPI: Keith Hernandez is a 65 y.o. male who is here for follow-up of a abdominal aortic aneurysm.  This was initially detected in 2007 with a CT angiogram.  He had a follow-up CT scan in 2015 which was done for abdominal pain.  At that time his aneurysm measured 2.9 cm.  A ultrasound in 2016 showed a 3.9 cm aneurysm, indicating 1 cm of growth within a year.  I was uncomfortable about the measurements on his follow-up ultrasound and therefore wanted to have it repeated.  He is back today for that study.  He denies any abdominal pain.  The patient suffers from hypercholesterolemia. He takes a statin 3 days a week. He has decreased his statin dose because of leg weakness when he was taking it daily. He suffers from hypertension, and particular whitecoat hypertension. He is on an ACE inhibitor. He has a record of his blood pressure which shows an average around 130/80. He also takes a daily aspirin and multivitamin.  Current Outpatient Prescriptions  Medication Sig Dispense Refill  . aspirin 81 MG tablet Take 81 mg by mouth daily.    Marland Kitchen atorvastatin (LIPITOR) 10 MG tablet Take 10 mg by mouth daily.    . cephALEXin (KEFLEX) 500 MG capsule Take 1 capsule (500 mg total) by mouth 4 (four) times daily. 40 capsule 0  . losartan (COZAAR) 50 MG tablet Take 50 mg by mouth daily.    . mometasone (NASONEX) 50 MCG/ACT nasal spray Place into the nose.    . Multiple Vitamin (MULTIVITAMIN) tablet Take 1 tablet by mouth daily.    Marland Kitchen oxyCODONE (OXY IR/ROXICODONE) 5 MG immediate release tablet Take 2 tablets (10 mg total) by mouth every 4 (four) hours as needed for severe pain. (Patient not taking: Reported on 01/13/2016) 45 tablet 0   No current facility-administered medications for this visit.    REVIEW OF SYSTEMS:  [X]  denotes positive finding, [ ]  denotes negative finding Cardiac  Comments:  Chest pain or chest  pressure:    Shortness of breath upon exertion:    Short of breath when lying flat:    Irregular heart rhythm:    Constitutional    Fever or chills:      PHYSICAL EXAM: Filed Vitals:   01/13/16 0837 01/13/16 0839  BP: 149/98 148/100  Pulse: 69   Height: 6' (1.829 m)   Weight: 213 lb 11.2 oz (96.934 kg)   SpO2: 97%     GENERAL: The patient is a well-nourished male, in no acute distress. The vital signs are documented above. CARDIOVASCULAR: There is a regular rate and rhythm. PULMONARY: There is good air exchange bilaterally without wheezing or rales. Neck: No carotid bruits Abdomen: No tenderness.  No pulsatile mass. Extremities: Palpable dorsalis pedis pulse bilaterally.  Data: I have ordered and reviewed the patient's ultrasound.  Aneurysmal diameter is 3.2  MEDICAL ISSUES: Imaging studies today reveal a 3.2 cm aneurysm.  This is approximately a 3 mm growth over the course of 18 months.  This would be the expected diameter increase.  I discussed this with the patient.  We discussed proceeding with repair once the maximum diameter is greater than 5 cm.  He will continue with annual surveillance.  His next study will be in 1 year.  Annamarie Major [facility] [employer]: [phone]

## 2016-01-17 DIAGNOSIS — Z1212 Encounter for screening for malignant neoplasm of rectum: Secondary | ICD-10-CM | POA: Diagnosis not present

## 2016-02-18 NOTE — Addendum Note (Signed)
Addended by: Thresa Ross C on: 02/18/2016 12:37 PM   Modules accepted: Orders

## 2016-06-18 DIAGNOSIS — Z23 Encounter for immunization: Secondary | ICD-10-CM | POA: Diagnosis not present

## 2016-09-29 DIAGNOSIS — M2241 Chondromalacia patellae, right knee: Secondary | ICD-10-CM | POA: Diagnosis not present

## 2016-10-05 DIAGNOSIS — J189 Pneumonia, unspecified organism: Secondary | ICD-10-CM | POA: Diagnosis not present

## 2016-10-05 DIAGNOSIS — R05 Cough: Secondary | ICD-10-CM | POA: Diagnosis not present

## 2016-10-05 DIAGNOSIS — Z6829 Body mass index (BMI) 29.0-29.9, adult: Secondary | ICD-10-CM | POA: Diagnosis not present

## 2016-10-12 DIAGNOSIS — M720 Palmar fascial fibromatosis [Dupuytren]: Secondary | ICD-10-CM | POA: Diagnosis not present

## 2017-01-05 DIAGNOSIS — E784 Other hyperlipidemia: Secondary | ICD-10-CM | POA: Diagnosis not present

## 2017-01-05 DIAGNOSIS — I10 Essential (primary) hypertension: Secondary | ICD-10-CM | POA: Diagnosis not present

## 2017-01-05 DIAGNOSIS — Z125 Encounter for screening for malignant neoplasm of prostate: Secondary | ICD-10-CM | POA: Diagnosis not present

## 2017-01-12 DIAGNOSIS — E559 Vitamin D deficiency, unspecified: Secondary | ICD-10-CM | POA: Insufficient documentation

## 2017-01-12 DIAGNOSIS — Z1389 Encounter for screening for other disorder: Secondary | ICD-10-CM | POA: Diagnosis not present

## 2017-01-12 DIAGNOSIS — Z0000 Encounter for general adult medical examination without abnormal findings: Secondary | ICD-10-CM | POA: Diagnosis not present

## 2017-01-12 DIAGNOSIS — Z23 Encounter for immunization: Secondary | ICD-10-CM | POA: Diagnosis not present

## 2017-01-12 DIAGNOSIS — Z6829 Body mass index (BMI) 29.0-29.9, adult: Secondary | ICD-10-CM | POA: Diagnosis not present

## 2017-01-12 DIAGNOSIS — I10 Essential (primary) hypertension: Secondary | ICD-10-CM | POA: Diagnosis not present

## 2017-01-12 DIAGNOSIS — M1612 Unilateral primary osteoarthritis, left hip: Secondary | ICD-10-CM | POA: Diagnosis not present

## 2017-01-12 DIAGNOSIS — M720 Palmar fascial fibromatosis [Dupuytren]: Secondary | ICD-10-CM | POA: Diagnosis not present

## 2017-01-12 DIAGNOSIS — K219 Gastro-esophageal reflux disease without esophagitis: Secondary | ICD-10-CM | POA: Diagnosis not present

## 2017-01-12 DIAGNOSIS — E784 Other hyperlipidemia: Secondary | ICD-10-CM | POA: Diagnosis not present

## 2017-01-12 DIAGNOSIS — G43909 Migraine, unspecified, not intractable, without status migrainosus: Secondary | ICD-10-CM | POA: Diagnosis not present

## 2017-01-12 DIAGNOSIS — R5383 Other fatigue: Secondary | ICD-10-CM | POA: Diagnosis not present

## 2017-01-12 DIAGNOSIS — I714 Abdominal aortic aneurysm, without rupture: Secondary | ICD-10-CM | POA: Diagnosis not present

## 2017-01-13 DIAGNOSIS — Z1212 Encounter for screening for malignant neoplasm of rectum: Secondary | ICD-10-CM | POA: Diagnosis not present

## 2017-01-20 ENCOUNTER — Encounter: Payer: Self-pay | Admitting: Family

## 2017-01-25 ENCOUNTER — Encounter: Payer: Self-pay | Admitting: Family

## 2017-01-25 ENCOUNTER — Ambulatory Visit (INDEPENDENT_AMBULATORY_CARE_PROVIDER_SITE_OTHER): Payer: Medicare Other | Admitting: Family

## 2017-01-25 ENCOUNTER — Ambulatory Visit (HOSPITAL_COMMUNITY)
Admission: RE | Admit: 2017-01-25 | Discharge: 2017-01-25 | Disposition: A | Discharge status: Home / Self Care | Payer: Medicare Other | Source: Ambulatory Visit | Attending: Family | Admitting: Family

## 2017-01-25 VITALS — BP 167/101 | HR 65 | Temp 97.3°F | Resp 18 | Ht 72.0 in | Wt 214.0 lb

## 2017-01-25 DIAGNOSIS — I714 Abdominal aortic aneurysm, without rupture, unspecified: Secondary | ICD-10-CM

## 2017-01-25 DIAGNOSIS — Z87891 Personal history of nicotine dependence: Secondary | ICD-10-CM

## 2017-01-25 DIAGNOSIS — R0989 Other specified symptoms and signs involving the circulatory and respiratory systems: Secondary | ICD-10-CM | POA: Diagnosis not present

## 2017-01-25 DIAGNOSIS — R140 Abdominal distension (gaseous): Secondary | ICD-10-CM | POA: Insufficient documentation

## 2017-01-25 NOTE — Progress Notes (Signed)
[facility]    CC: Follow up Abdominal Aortic Aneurysm  History of Present Illness  Keith Hernandez is a 66 y.o. ([birth year]) male who is here for follow-up of a abdominal aortic aneurysm.  This was initially detected in 2007 with a CT angiogram.  He had a follow-up CT scan in 2015 which was done for abdominal pain.  At that time his aneurysm measured 2.9 cm.  An ultrasound in 2016 showed a 3.9 cm aneurysm, indicating 1 cm of growth within a year.  Dr. Trula Slade was uncomfortable about the measurements on his follow-up ultrasound and therefore wanted to have it repeated; that duplex measured 3.24.cm.  He denies any abdominal pain or back pain.  He denies any known history of stroke or TIA. He denies any claudication symptoms with walking. He denies any history of cardiac problems other than when he was drinking too much coffee he had periods of palpations. The palpitations stopped when he stopped drinking coffee.   The patient suffers from hypercholesterolemia. He takes a statin 3 days a week. He has decreased his statin dose because of leg weakness when he was taking it daily.He also takes a daily aspirin and multivitamin.  Pt states that he has white coat syndrome in re to his hypertension. He states his blood pressure medication dose frequency has been increased this last week. He states at home, his blood pressure in the last 2 days has been 120's/70's.    Past Medical History:  Diagnosis Date  . AAA (abdominal aortic aneurysm) ([place])   . Dupuytren's disease   . GERD (gastroesophageal reflux disease)   . Hyperlipidemia   . Hypertension   . Migraines    Past Surgical History:  Procedure Laterality Date  . achilles reattachment    . FASCIOTOMY Left 10/11/2015   Procedure: LEFT HAND SUBTOTAL PALMAR FASCIOTOMY WITH REPAIR RECONSTRUCTION ;  Surgeon: Roseanne Kaufman, MD;  Location: [facility];  Service: Orthopedics;  Laterality: Left;   Social  History Social History   Social History  . Marital status: Married    Spouse name: N/A  . Number of children: N/A  . Years of education: N/A   Occupational History  . Not on file.   Social History Main Topics  . Smoking status: Former Smoker    Packs/day: 1.00    Types: Cigarettes    Quit date: 07/15/1999  . Smokeless tobacco: Never Used  . Alcohol use 4.2 oz/week    5 Glasses of wine, 2 Shots of liquor per week  . Drug use: Unknown  . Sexual activity: Not on file   Other Topics Concern  . Not on file   Social History Narrative  . No narrative on file   Family History Family History  Problem Relation Age of Onset  . Arthritis Mother   . Heart disease Mother   . Heart attack Mother   . Asthma Father   . Asthma Paternal Grandfather     Current Outpatient Prescriptions on File Prior to Visit  Medication Sig Dispense Refill  . aspirin 81 MG tablet Take 81 mg by mouth daily.    Marland Kitchen atorvastatin (LIPITOR) 10 MG tablet Take 10 mg by mouth daily.    Marland Kitchen losartan (COZAAR) 50 MG tablet Take 50 mg by mouth 2 (two) times daily.     . mometasone (NASONEX) 50 MCG/ACT nasal spray Place into the nose.    . Multiple Vitamin (MULTIVITAMIN) tablet Take 1 tablet by mouth daily.    Marland Kitchen  cephALEXin (KEFLEX) 500 MG capsule Take 1 capsule (500 mg total) by mouth 4 (four) times daily. (Patient not taking: Reported on 01/25/2017) 40 capsule 0  . oxyCODONE (OXY IR/ROXICODONE) 5 MG immediate release tablet Take 2 tablets (10 mg total) by mouth every 4 (four) hours as needed for severe pain. (Patient not taking: Reported on 01/13/2016) 45 tablet 0   No current facility-administered medications on file prior to visit.    No Known Allergies  ROS: See HPI for pertinent positives and negatives.  Physical Examination  Vitals:   01/25/17 0841 01/25/17 0843  BP: (!) 161/96 (!) 167/101  Pulse: 65   Resp: 18   Temp: 97.3 F (36.3 C)   TempSrc: Oral   SpO2: 98%   Weight: 214 lb (97.1 kg)   Height:  6' (1.829 m)    Body mass index is 29.02 kg/m.  General: A&O x 3, WD, overweight male.  Pulmonary: Sym exp, respirations are non labored, good air movt, CTAB, no rales, rhonchi, or wheezing.  Cardiac: RRR, Nl S1, S2, no detected murmur.   Carotid Bruits Right Left   Negative Negative    Abdominal aorta is not palpable Radial pulses are 2+ palpable bilaterally                          VASCULAR EXAM:                                                                                                         LE Pulses Right Left       FEMORAL  not palpable  not palpable        POPLITEAL  3+ palpable   2+ palpable       POSTERIOR TIBIAL  1+ palpable   1+ palpable        DORSALIS PEDIS      ANTERIOR TIBIAL 2+ palpable  2+ palpable      Gastrointestinal: soft, NTND, -G/R, - HSM, - masses palpated, - CVAT B.  Musculoskeletal: M/S 5/5 throughout, Extremities without ischemic changes.  Neurologic: CN 2-12 intact, Pain and light touch intact in extremities are intact, Motor exam as listed above.  Non-Invasive Vascular Imaging  AAA Duplex (01/25/2017)  Previous size: 3.24 cm (Date: 01-13-16)  Current size:  3.9 cm (Date: 01/25/17), Right CIA: 1.4 cm; Left CIA: 1.4 cm. Technically difficult exam due to bowel gas.   Medical Decision Making  The patient is a 66 y.o. male who presents with asymptomatic AAA with increase in size from 3.24 cm to 3.9 cm in a year, based on limited visualization.  He has prominent bilateral popliteal pulses with no claudication symptoms in his legs with walking, no signs if ischemia in his feet or legs, with palpable bilateral pedal pulses.    Based on this patient's exam and diagnostic studies, the patient will follow up in 1 year  with the following studies: AAA duplex, bilateral popliteal artery duplex.  Consideration for repair of AAA would be made when the size is 5.0 cm,  growth > 1 cm/yr, and symptomatic status.  I emphasized the importance of  maximal medical management including strict control of blood pressure, blood glucose, and lipid levels, antiplatelet agents, obtaining regular exercise, and continued cessation of smoking.   The patient was given information about AAA including signs, symptoms, treatment, and how to minimize the risk of enlargement and rupture of aneurysms.    The patient was advised to call 911 should the patient experience sudden onset abdominal or back pain.   Thank you for allowing [place] to participate in this patient's care.  Clemon Chambers, RN, MSN, FNP-C [facility] Office: [facility] Physician: Trula Slade  01/25/2017, 8:48 AM

## 2017-01-25 NOTE — Patient Instructions (Addendum)

## 2017-01-29 NOTE — Addendum Note (Signed)
Addended by: Lianne Cure A on: 01/29/2017 10:06 AM   Modules accepted: Orders

## 2017-02-26 DIAGNOSIS — H40013 Open angle with borderline findings, low risk, bilateral: Secondary | ICD-10-CM | POA: Diagnosis not present

## 2017-03-08 DIAGNOSIS — M5412 Radiculopathy, cervical region: Secondary | ICD-10-CM | POA: Diagnosis not present

## 2017-03-08 DIAGNOSIS — M5030 Other cervical disc degeneration, unspecified cervical region: Secondary | ICD-10-CM | POA: Diagnosis not present

## 2017-03-23 DIAGNOSIS — R222 Localized swelling, mass and lump, trunk: Secondary | ICD-10-CM | POA: Diagnosis not present

## 2017-03-23 DIAGNOSIS — D485 Neoplasm of uncertain behavior of skin: Secondary | ICD-10-CM | POA: Diagnosis not present

## 2017-03-23 DIAGNOSIS — D179 Benign lipomatous neoplasm, unspecified: Secondary | ICD-10-CM | POA: Insufficient documentation

## 2017-03-24 DIAGNOSIS — Z6828 Body mass index (BMI) 28.0-28.9, adult: Secondary | ICD-10-CM | POA: Diagnosis not present

## 2017-03-24 DIAGNOSIS — M5412 Radiculopathy, cervical region: Secondary | ICD-10-CM | POA: Diagnosis not present

## 2017-03-24 DIAGNOSIS — E784 Other hyperlipidemia: Secondary | ICD-10-CM | POA: Diagnosis not present

## 2017-03-24 DIAGNOSIS — E65 Localized adiposity: Secondary | ICD-10-CM | POA: Diagnosis not present

## 2017-03-26 DIAGNOSIS — M5412 Radiculopathy, cervical region: Secondary | ICD-10-CM | POA: Diagnosis not present

## 2017-03-30 DIAGNOSIS — M5412 Radiculopathy, cervical region: Secondary | ICD-10-CM | POA: Diagnosis not present

## 2017-04-05 DIAGNOSIS — M5412 Radiculopathy, cervical region: Secondary | ICD-10-CM | POA: Diagnosis not present

## 2017-04-07 DIAGNOSIS — M5412 Radiculopathy, cervical region: Secondary | ICD-10-CM | POA: Diagnosis not present

## 2017-04-20 DIAGNOSIS — M5412 Radiculopathy, cervical region: Secondary | ICD-10-CM | POA: Diagnosis not present

## 2017-04-27 DIAGNOSIS — D171 Benign lipomatous neoplasm of skin and subcutaneous tissue of trunk: Secondary | ICD-10-CM | POA: Diagnosis not present

## 2017-04-30 DIAGNOSIS — Z6829 Body mass index (BMI) 29.0-29.9, adult: Secondary | ICD-10-CM | POA: Diagnosis not present

## 2017-04-30 DIAGNOSIS — R05 Cough: Secondary | ICD-10-CM | POA: Diagnosis not present

## 2017-04-30 DIAGNOSIS — J069 Acute upper respiratory infection, unspecified: Secondary | ICD-10-CM | POA: Diagnosis not present

## 2017-05-03 DIAGNOSIS — M5412 Radiculopathy, cervical region: Secondary | ICD-10-CM | POA: Diagnosis not present

## 2017-05-03 DIAGNOSIS — M5030 Other cervical disc degeneration, unspecified cervical region: Secondary | ICD-10-CM | POA: Diagnosis not present

## 2017-05-05 ENCOUNTER — Ambulatory Visit (HOSPITAL_BASED_OUTPATIENT_CLINIC_OR_DEPARTMENT_OTHER): Admission: RE | Admit: 2017-05-05 | Payer: Medicare Other | Source: Ambulatory Visit | Admitting: Plastic Surgery

## 2017-05-05 ENCOUNTER — Encounter (HOSPITAL_BASED_OUTPATIENT_CLINIC_OR_DEPARTMENT_OTHER): Admission: RE | Payer: Self-pay | Source: Ambulatory Visit

## 2017-05-05 SURGERY — EXCISION MASS
Anesthesia: General

## 2017-05-20 DIAGNOSIS — M5412 Radiculopathy, cervical region: Secondary | ICD-10-CM | POA: Diagnosis not present

## 2017-05-20 DIAGNOSIS — E784 Other hyperlipidemia: Secondary | ICD-10-CM | POA: Diagnosis not present

## 2017-05-27 DIAGNOSIS — M5030 Other cervical disc degeneration, unspecified cervical region: Secondary | ICD-10-CM | POA: Diagnosis not present

## 2017-05-27 DIAGNOSIS — M5412 Radiculopathy, cervical region: Secondary | ICD-10-CM | POA: Diagnosis not present

## 2017-07-05 DIAGNOSIS — Z23 Encounter for immunization: Secondary | ICD-10-CM | POA: Diagnosis not present

## 2017-07-23 ENCOUNTER — Encounter (HOSPITAL_BASED_OUTPATIENT_CLINIC_OR_DEPARTMENT_OTHER): Payer: Self-pay | Admitting: *Deleted

## 2017-07-23 ENCOUNTER — Other Ambulatory Visit: Payer: Self-pay

## 2017-07-26 ENCOUNTER — Encounter (HOSPITAL_BASED_OUTPATIENT_CLINIC_OR_DEPARTMENT_OTHER)
Admission: RE | Admit: 2017-07-26 | Discharge: 2017-07-26 | Disposition: A | Discharge status: Home / Self Care | Payer: Medicare Other | Source: Ambulatory Visit | Attending: Plastic Surgery | Admitting: Plastic Surgery

## 2017-07-26 DIAGNOSIS — M542 Cervicalgia: Secondary | ICD-10-CM | POA: Diagnosis not present

## 2017-07-26 DIAGNOSIS — E785 Hyperlipidemia, unspecified: Secondary | ICD-10-CM | POA: Diagnosis not present

## 2017-07-26 DIAGNOSIS — I739 Peripheral vascular disease, unspecified: Secondary | ICD-10-CM | POA: Diagnosis not present

## 2017-07-26 DIAGNOSIS — Z87891 Personal history of nicotine dependence: Secondary | ICD-10-CM | POA: Diagnosis not present

## 2017-07-26 DIAGNOSIS — Z79899 Other long term (current) drug therapy: Secondary | ICD-10-CM | POA: Diagnosis not present

## 2017-07-26 DIAGNOSIS — Z8249 Family history of ischemic heart disease and other diseases of the circulatory system: Secondary | ICD-10-CM | POA: Diagnosis not present

## 2017-07-26 DIAGNOSIS — I10 Essential (primary) hypertension: Secondary | ICD-10-CM | POA: Diagnosis not present

## 2017-07-26 DIAGNOSIS — K219 Gastro-esophageal reflux disease without esophagitis: Secondary | ICD-10-CM | POA: Diagnosis not present

## 2017-07-26 DIAGNOSIS — D1809 Hemangioma of other sites: Secondary | ICD-10-CM | POA: Diagnosis not present

## 2017-07-26 DIAGNOSIS — D1721 Benign lipomatous neoplasm of skin and subcutaneous tissue of right arm: Secondary | ICD-10-CM | POA: Diagnosis not present

## 2017-07-26 DIAGNOSIS — K130 Diseases of lips: Secondary | ICD-10-CM | POA: Diagnosis not present

## 2017-07-26 DIAGNOSIS — Z7982 Long term (current) use of aspirin: Secondary | ICD-10-CM | POA: Diagnosis not present

## 2017-07-26 DIAGNOSIS — I714 Abdominal aortic aneurysm, without rupture: Secondary | ICD-10-CM | POA: Diagnosis not present

## 2017-07-27 ENCOUNTER — Ambulatory Visit: Payer: Self-pay | Admitting: Plastic Surgery

## 2017-07-27 DIAGNOSIS — D171 Benign lipomatous neoplasm of skin and subcutaneous tissue of trunk: Secondary | ICD-10-CM

## 2017-07-27 DIAGNOSIS — L989 Disorder of the skin and subcutaneous tissue, unspecified: Secondary | ICD-10-CM

## 2017-07-28 NOTE — H&P (Signed)
Keith Hernandez is an 66 y.o. male.   Chief Complaint: lipoma of back and skin lesion of lip HPI: The patient is a 66 y.o. yrs old wm here for excision of a lipoma on his back right upper shoulder area and a changing skin lesion of his lip.  He noticed the lipoma several months ago.  It seems to have gotten larger.  It is 5 x 6 cm in size on the upper shoulder back area.  It is soft, not mobile and feels like a lipoma.  It is not discolored and does not have a pulse.  He was scheduled to have the lipoma excised, but his wife had an accident.   He noticed pain in the neck area and was seen by ortho and given a medrol dose pack which he started 03/09/17.  A few days later he noticed the little hemangioma on his lip was 3 times bigger and then went away the next day.  It is likely that he bit his lip at night.  He has two lesions on the lower lip on the lateral aspect to the left and to the right.  They seem to be getting larger.  They are flesh in color.  He does not recall any trauma to these two areas.  They are 3 mm in size.  He recently stopped his lipitor due to fatigue and feels much better. He reports he has been going to Physical Therapy for some neck pain and feels this is helping.    Past Medical History:  Diagnosis Date  . AAA (abdominal aortic aneurysm) ([place])   . Bulging of cervical intervertebral disc    C6-7  . Dupuytren's disease   . GERD (gastroesophageal reflux disease)    OTC meds pnr  . Hyperlipidemia   . Hypertension   . Migraines     Past Surgical History:  Procedure Laterality Date  . achilles reattachment      Family History  Problem Relation Age of Onset  . Arthritis Mother   . Heart disease Mother   . Heart attack Mother   . Asthma Father   . Asthma Paternal Grandfather    Social History:  reports that he quit smoking about 18 years ago. His smoking use included cigarettes. He smoked 1.00 pack per day. he has never used smokeless tobacco. He reports that he drinks  about 4.2 oz of alcohol per week. He reports that he does not use drugs.  Allergies: No Known Allergies  No medications prior to admission.    No results found for this or any previous visit (from the past 48 hour(s)). No results found.  Review of Systems  Constitutional: Negative.   HENT: Negative.   Eyes: Negative.   Respiratory: Negative.   Cardiovascular: Negative.   Gastrointestinal: Negative.   Genitourinary: Negative.   Musculoskeletal: Negative.   Skin: Negative.   Neurological: Negative.   Psychiatric/Behavioral: Negative.     Height 6' (1.829 m), weight 95.3 kg (210 lb). Physical Exam  Constitutional: He is oriented to person, place, and time. He appears well-developed and well-nourished.  HENT:  Head: Normocephalic and atraumatic.  Eyes: Conjunctivae and EOM are normal. Pupils are equal, round, and reactive to light.  Cardiovascular: Normal rate.  Respiratory: Effort normal. No respiratory distress.      GI: Soft. He exhibits no distension.  Neurological: He is oriented to person, place, and time.  Skin: Skin is warm.  Psychiatric: He has a normal mood and affect. His  behavior is normal. Judgment and thought content normal.     Assessment/Plan Plan for excision of a posterior shoulder lipoma and changing skin lesion of the lip.  [name], DO 07/28/2017, 7:46 AM

## 2017-07-28 NOTE — Anesthesia Preprocedure Evaluation (Addendum)
Anesthesia Evaluation  Patient identified by MRN, date of birth, ID band Patient awake    Reviewed: Allergy & Precautions, NPO status , Patient's Chart, lab work & pertinent test results  History of Anesthesia Complications Negative for: history of anesthetic complications  Airway Mallampati: II  TM Distance: >3 FB Neck ROM: Full    Dental no notable dental hx. (+) Dental Advisory Given   Pulmonary neg pulmonary ROS, former smoker,    Pulmonary exam normal        Cardiovascular hypertension, Pt. on medications + Peripheral Vascular Disease  Normal cardiovascular exam  3.9 cm distal abdominal aneurysm    Neuro/Psych  Headaches, negative neurological ROS  negative psych ROS   GI/Hepatic Neg liver ROS, GERD  Medicated,  Endo/Other  negative endocrine ROS  Renal/GU negative Renal ROS  negative genitourinary   Musculoskeletal negative musculoskeletal ROS (+)   Abdominal   Peds negative pediatric ROS (+)  Hematology negative hematology ROS (+)   Anesthesia Other Findings   Reproductive/Obstetrics negative OB ROS                            Anesthesia Physical  Anesthesia Plan  ASA: II  Anesthesia Plan: MAC   Post-op Pain Management:    Induction: Intravenous  PONV Risk Score and Plan: 2  Airway Management Planned: Natural Airway and Simple Face Mask  Additional Equipment:   Intra-op Plan:   Post-operative Plan:   Informed Consent: I have reviewed the patients History and Physical, chart, labs and discussed the procedure including the risks, benefits and alternatives for the proposed anesthesia with the patient or authorized representative who has indicated his/her understanding and acceptance.   Dental advisory given  Plan Discussed with: CRNA, Anesthesiologist and Surgeon  Anesthesia Plan Comments:       Anesthesia Quick Evaluation

## 2017-07-29 ENCOUNTER — Encounter (HOSPITAL_BASED_OUTPATIENT_CLINIC_OR_DEPARTMENT_OTHER): Payer: Self-pay

## 2017-07-29 ENCOUNTER — Other Ambulatory Visit: Payer: Self-pay

## 2017-07-29 ENCOUNTER — Encounter (HOSPITAL_BASED_OUTPATIENT_CLINIC_OR_DEPARTMENT_OTHER)
Admission: RE | Disposition: A | Discharge status: Home / Self Care | Payer: Self-pay | Source: Ambulatory Visit | Attending: Plastic Surgery

## 2017-07-29 ENCOUNTER — Ambulatory Visit (HOSPITAL_BASED_OUTPATIENT_CLINIC_OR_DEPARTMENT_OTHER): Payer: Medicare Other | Admitting: Anesthesiology

## 2017-07-29 ENCOUNTER — Ambulatory Visit (HOSPITAL_BASED_OUTPATIENT_CLINIC_OR_DEPARTMENT_OTHER)
Admission: RE | Admit: 2017-07-29 | Discharge: 2017-07-29 | Disposition: A | Discharge status: Home / Self Care | Payer: Medicare Other | Source: Ambulatory Visit | Attending: Plastic Surgery | Admitting: Plastic Surgery

## 2017-07-29 DIAGNOSIS — I10 Essential (primary) hypertension: Secondary | ICD-10-CM | POA: Insufficient documentation

## 2017-07-29 DIAGNOSIS — I714 Abdominal aortic aneurysm, without rupture: Secondary | ICD-10-CM | POA: Diagnosis not present

## 2017-07-29 DIAGNOSIS — D171 Benign lipomatous neoplasm of skin and subcutaneous tissue of trunk: Secondary | ICD-10-CM | POA: Diagnosis not present

## 2017-07-29 DIAGNOSIS — I739 Peripheral vascular disease, unspecified: Secondary | ICD-10-CM | POA: Diagnosis not present

## 2017-07-29 DIAGNOSIS — Z7982 Long term (current) use of aspirin: Secondary | ICD-10-CM | POA: Diagnosis not present

## 2017-07-29 DIAGNOSIS — D1721 Benign lipomatous neoplasm of skin and subcutaneous tissue of right arm: Secondary | ICD-10-CM | POA: Insufficient documentation

## 2017-07-29 DIAGNOSIS — L989 Disorder of the skin and subcutaneous tissue, unspecified: Secondary | ICD-10-CM | POA: Diagnosis not present

## 2017-07-29 DIAGNOSIS — K219 Gastro-esophageal reflux disease without esophagitis: Secondary | ICD-10-CM | POA: Diagnosis not present

## 2017-07-29 DIAGNOSIS — K130 Diseases of lips: Secondary | ICD-10-CM | POA: Diagnosis not present

## 2017-07-29 DIAGNOSIS — Z8249 Family history of ischemic heart disease and other diseases of the circulatory system: Secondary | ICD-10-CM | POA: Insufficient documentation

## 2017-07-29 DIAGNOSIS — D1779 Benign lipomatous neoplasm of other sites: Secondary | ICD-10-CM | POA: Diagnosis not present

## 2017-07-29 DIAGNOSIS — Z87891 Personal history of nicotine dependence: Secondary | ICD-10-CM | POA: Insufficient documentation

## 2017-07-29 DIAGNOSIS — L089 Local infection of the skin and subcutaneous tissue, unspecified: Secondary | ICD-10-CM | POA: Diagnosis not present

## 2017-07-29 DIAGNOSIS — D1809 Hemangioma of other sites: Secondary | ICD-10-CM | POA: Insufficient documentation

## 2017-07-29 DIAGNOSIS — E785 Hyperlipidemia, unspecified: Secondary | ICD-10-CM | POA: Diagnosis not present

## 2017-07-29 DIAGNOSIS — Z79899 Other long term (current) drug therapy: Secondary | ICD-10-CM | POA: Insufficient documentation

## 2017-07-29 DIAGNOSIS — L568 Other specified acute skin changes due to ultraviolet radiation: Secondary | ICD-10-CM | POA: Diagnosis not present

## 2017-07-29 DIAGNOSIS — D1801 Hemangioma of skin and subcutaneous tissue: Secondary | ICD-10-CM | POA: Diagnosis not present

## 2017-07-29 DIAGNOSIS — D170 Benign lipomatous neoplasm of skin and subcutaneous tissue of head, face and neck: Secondary | ICD-10-CM | POA: Diagnosis not present

## 2017-07-29 DIAGNOSIS — M542 Cervicalgia: Secondary | ICD-10-CM | POA: Insufficient documentation

## 2017-07-29 HISTORY — PX: LESION DESTRUCTION: SHX5236

## 2017-07-29 HISTORY — DX: Other cervical disc degeneration, unspecified cervical region: M50.30

## 2017-07-29 HISTORY — PX: LIPOMA EXCISION: SHX5283

## 2017-07-29 HISTORY — DX: Other cervical disc displacement, unspecified cervical region: M50.20

## 2017-07-29 SURGERY — EXCISION LIPOMA
Anesthesia: Monitor Anesthesia Care | Site: Mouth | Laterality: Right

## 2017-07-29 MED ORDER — BACITRACIN ZINC 500 UNIT/GM EX OINT
TOPICAL_OINTMENT | CUTANEOUS | Status: AC
  Filled 2017-07-29: qty 0.9

## 2017-07-29 MED ORDER — PROPOFOL 500 MG/50ML IV EMUL
INTRAVENOUS | Status: DC | PRN
  Administered 2017-07-29: 25 ug/kg/min via INTRAVENOUS

## 2017-07-29 MED ORDER — ACETAMINOPHEN 325 MG PO TABS: 650.0000 mg | ORAL_TABLET | ORAL | Status: DC | PRN

## 2017-07-29 MED ORDER — LIDOCAINE-EPINEPHRINE 1 %-1:100000 IJ SOLN
INTRAMUSCULAR | Status: DC | PRN
  Administered 2017-07-29: 1 mL
  Administered 2017-07-29: 15 mL

## 2017-07-29 MED ORDER — FENTANYL CITRATE (PF) 100 MCG/2ML IJ SOLN
INTRAMUSCULAR | Status: AC
  Filled 2017-07-29: qty 2

## 2017-07-29 MED ORDER — MIDAZOLAM HCL 2 MG/2ML IJ SOLN: 1.0000 mg | INTRAMUSCULAR | Status: DC | PRN

## 2017-07-29 MED ORDER — ACETAMINOPHEN 650 MG RE SUPP: 650.0000 mg | RECTAL | Status: DC | PRN

## 2017-07-29 MED ORDER — LACTATED RINGERS IV SOLN: INTRAVENOUS | Status: DC

## 2017-07-29 MED ORDER — CEFAZOLIN SODIUM-DEXTROSE 2-4 GM/100ML-% IV SOLN
INTRAVENOUS | Status: AC
  Filled 2017-07-29: qty 100

## 2017-07-29 MED ORDER — SCOPOLAMINE 1 MG/3DAYS TD PT72: 1.0000 | MEDICATED_PATCH | Freq: Once | TRANSDERMAL | Status: DC | PRN

## 2017-07-29 MED ORDER — OXYCODONE HCL 5 MG PO TABS: 5.0000 mg | ORAL_TABLET | ORAL | Status: DC | PRN

## 2017-07-29 MED ORDER — LIDOCAINE HCL (CARDIAC) 20 MG/ML IV SOLN
INTRAVENOUS | Status: DC | PRN
  Administered 2017-07-29: 20 mg via INTRAVENOUS

## 2017-07-29 MED ORDER — SODIUM CHLORIDE 0.9% FLUSH: 3.0000 mL | Freq: Two times a day (BID) | INTRAVENOUS | Status: DC

## 2017-07-29 MED ORDER — CEFAZOLIN SODIUM-DEXTROSE 2-3 GM-%(50ML) IV SOLR
INTRAVENOUS | Status: DC | PRN
  Administered 2017-07-29: 2 g via INTRAVENOUS

## 2017-07-29 MED ORDER — BUPIVACAINE HCL (PF) 0.25 % IJ SOLN
INTRAMUSCULAR | Status: AC
  Filled 2017-07-29: qty 90

## 2017-07-29 MED ORDER — BACITRACIN ZINC 500 UNIT/GM EX OINT
TOPICAL_OINTMENT | CUTANEOUS | Status: AC
  Filled 2017-07-29: qty 28.35

## 2017-07-29 MED ORDER — LIDOCAINE 2% (20 MG/ML) 5 ML SYRINGE
INTRAMUSCULAR | Status: AC
  Filled 2017-07-29: qty 5

## 2017-07-29 MED ORDER — MIDAZOLAM HCL 5 MG/5ML IJ SOLN
INTRAMUSCULAR | Status: DC | PRN
  Administered 2017-07-29: 2 mg via INTRAVENOUS

## 2017-07-29 MED ORDER — ONDANSETRON HCL 4 MG/2ML IJ SOLN
INTRAMUSCULAR | Status: DC | PRN
  Administered 2017-07-29: 4 mg via INTRAVENOUS

## 2017-07-29 MED ORDER — CEFAZOLIN SODIUM-DEXTROSE 2-4 GM/100ML-% IV SOLN: 2.0000 g | INTRAVENOUS | Status: DC

## 2017-07-29 MED ORDER — SODIUM CHLORIDE 0.9% FLUSH: 3.0000 mL | INTRAVENOUS | Status: DC | PRN

## 2017-07-29 MED ORDER — PROMETHAZINE HCL 25 MG/ML IJ SOLN: 6.2500 mg | INTRAMUSCULAR | Status: DC | PRN

## 2017-07-29 MED ORDER — ONDANSETRON HCL 4 MG/2ML IJ SOLN
INTRAMUSCULAR | Status: AC
  Filled 2017-07-29: qty 4

## 2017-07-29 MED ORDER — MIDAZOLAM HCL 2 MG/2ML IJ SOLN
INTRAMUSCULAR | Status: AC
  Filled 2017-07-29: qty 2

## 2017-07-29 MED ORDER — PROPOFOL 500 MG/50ML IV EMUL
INTRAVENOUS | Status: AC
  Filled 2017-07-29: qty 50

## 2017-07-29 MED ORDER — SODIUM CHLORIDE 0.9 % IV SOLN: 250.0000 mL | INTRAVENOUS | Status: DC | PRN

## 2017-07-29 MED ORDER — BACITRACIN ZINC 500 UNIT/GM EX OINT
TOPICAL_OINTMENT | CUTANEOUS | Status: DC | PRN
  Administered 2017-07-29: 1 via TOPICAL

## 2017-07-29 MED ORDER — DEXAMETHASONE SODIUM PHOSPHATE 10 MG/ML IJ SOLN
INTRAMUSCULAR | Status: AC
  Filled 2017-07-29: qty 1

## 2017-07-29 MED ORDER — HYDROMORPHONE HCL 1 MG/ML IJ SOLN: 0.2500 mg | INTRAMUSCULAR | Status: DC | PRN

## 2017-07-29 MED ORDER — BUPIVACAINE-EPINEPHRINE (PF) 0.25% -1:200000 IJ SOLN
INTRAMUSCULAR | Status: AC
  Filled 2017-07-29: qty 30

## 2017-07-29 MED ORDER — LIDOCAINE-EPINEPHRINE 1 %-1:100000 IJ SOLN
INTRAMUSCULAR | Status: AC
  Filled 2017-07-29: qty 1

## 2017-07-29 MED ORDER — FENTANYL CITRATE (PF) 100 MCG/2ML IJ SOLN
INTRAMUSCULAR | Status: DC | PRN
  Administered 2017-07-29: 100 ug via INTRAVENOUS

## 2017-07-29 MED ORDER — PROPOFOL 10 MG/ML IV BOLUS
INTRAVENOUS | Status: DC | PRN
  Administered 2017-07-29 (×4): 10 mg via INTRAVENOUS

## 2017-07-29 MED ORDER — FENTANYL CITRATE (PF) 100 MCG/2ML IJ SOLN: 50.0000 ug | INTRAMUSCULAR | Status: DC | PRN

## 2017-07-29 MED ORDER — LACTATED RINGERS IV SOLN
INTRAVENOUS | Status: DC | PRN
  Administered 2017-07-29: 07:00:00 via INTRAVENOUS

## 2017-07-29 SURGICAL SUPPLY — 74 items
ADH SKN CLS APL DERMABOND .7 (GAUZE/BANDAGES/DRESSINGS) ×2
BLADE CLIPPER SURG (BLADE) IMPLANT
BLADE HEX COATED 2.75 (ELECTRODE) IMPLANT
BLADE SURG 15 STRL LF DISP TIS (BLADE) ×2 IMPLANT
BLADE SURG 15 STRL SS (BLADE) ×6
BNDG CONFORM 2 STRL LF (GAUZE/BANDAGES/DRESSINGS) IMPLANT
BNDG ELASTIC 2X5.8 VLCR STR LF (GAUZE/BANDAGES/DRESSINGS) IMPLANT
CANISTER SUCT 1200ML W/VALVE (MISCELLANEOUS) IMPLANT
CHLORAPREP W/TINT 26ML (MISCELLANEOUS) ×1 IMPLANT
CORD BIPOLAR FORCEPS 12FT (ELECTRODE) IMPLANT
COVER BACK TABLE 60X90IN (DRAPES) ×3 IMPLANT
COVER MAYO STAND STRL (DRAPES) ×3 IMPLANT
DERMABOND ADVANCED (GAUZE/BANDAGES/DRESSINGS) ×1
DERMABOND ADVANCED .7 DNX12 (GAUZE/BANDAGES/DRESSINGS) IMPLANT
DRAPE LAPAROTOMY 100X72 PEDS (DRAPES) ×1 IMPLANT
DRAPE U-SHAPE 76X120 STRL (DRAPES) IMPLANT
DRSG MEPILEX BORDER 4X8 (GAUZE/BANDAGES/DRESSINGS) ×1 IMPLANT
DRSG TEGADERM 2-3/8X2-3/4 SM (GAUZE/BANDAGES/DRESSINGS) IMPLANT
ELECT COATED BLADE 2.86 ST (ELECTRODE) IMPLANT
ELECT NDL BLADE 2-5/6 (NEEDLE) ×2 IMPLANT
ELECT NEEDLE BLADE 2-5/6 (NEEDLE) ×3 IMPLANT
ELECT REM PT RETURN 9FT ADLT (ELECTROSURGICAL) ×3
ELECT REM PT RETURN 9FT PED (ELECTROSURGICAL)
ELECTRODE REM PT RETRN 9FT PED (ELECTROSURGICAL) IMPLANT
ELECTRODE REM PT RTRN 9FT ADLT (ELECTROSURGICAL) IMPLANT
GAUZE SPONGE 4X4 12PLY STRL LF (GAUZE/BANDAGES/DRESSINGS) ×1 IMPLANT
GAUZE XEROFORM 1X8 LF (GAUZE/BANDAGES/DRESSINGS) IMPLANT
GLOVE BIO SURGEON STRL SZ 6.5 (GLOVE) ×9 IMPLANT
GLOVE BIOGEL PI IND STRL 7.0 (GLOVE) IMPLANT
GLOVE BIOGEL PI INDICATOR 7.0 (GLOVE) ×1
GLOVE ECLIPSE 6.5 STRL STRAW (GLOVE) ×1 IMPLANT
GOWN STRL REUS W/ TWL LRG LVL3 (GOWN DISPOSABLE) ×2 IMPLANT
GOWN STRL REUS W/TWL LRG LVL3 (GOWN DISPOSABLE) ×9
NDL HYPO 25X1 1.5 SAFETY (NEEDLE) IMPLANT
NDL HYPO 30GX1 BEV (NEEDLE) ×2 IMPLANT
NDL PRECISIONGLIDE 27X1.5 (NEEDLE) IMPLANT
NEEDLE HYPO 25X1 1.5 SAFETY (NEEDLE) ×3 IMPLANT
NEEDLE HYPO 30GX1 BEV (NEEDLE) ×3 IMPLANT
NEEDLE PRECISIONGLIDE 27X1.5 (NEEDLE) IMPLANT
NS IRRIG 1000ML POUR BTL (IV SOLUTION) IMPLANT
PACK BASIN DAY SURGERY FS (CUSTOM PROCEDURE TRAY) ×3 IMPLANT
PENCIL BUTTON HOLSTER BLD 10FT (ELECTRODE) ×1 IMPLANT
PUNCH BIOPSY DERMAL 2MM (MISCELLANEOUS) IMPLANT
PUNCH BIOPSY DERMAL 3MM (MISCELLANEOUS) IMPLANT
PUNCH BIOPSY DERMAL 4MM (MISCELLANEOUS) IMPLANT
PUNCH BIOPSY DERMAL 5MM STRL (MISCELLANEOUS) IMPLANT
SHEET MEDIUM DRAPE 40X70 STRL (DRAPES) IMPLANT
SLEEVE SCD COMPRESS KNEE MED (MISCELLANEOUS) ×1 IMPLANT
SPONGE GAUZE 2X2 8PLY STRL LF (GAUZE/BANDAGES/DRESSINGS) IMPLANT
STRIP CLOSURE SKIN 1/2X4 (GAUZE/BANDAGES/DRESSINGS) ×1 IMPLANT
SUCTION FRAZIER HANDLE 10FR (MISCELLANEOUS)
SUCTION TUBE FRAZIER 10FR DISP (MISCELLANEOUS) IMPLANT
SUT CHROMIC 4 0 P 3 18 (SUTURE) IMPLANT
SUT ETHILON 4 0 PS 2 18 (SUTURE) IMPLANT
SUT ETHILON 5 0 P 3 18 (SUTURE)
SUT MNCRL 6-0 UNDY P1 1X18 (SUTURE) IMPLANT
SUT MNCRL AB 4-0 PS2 18 (SUTURE) ×1 IMPLANT
SUT MON AB 3-0 SH 27 (SUTURE) ×3
SUT MON AB 3-0 SH27 (SUTURE) IMPLANT
SUT MON AB 5-0 P3 18 (SUTURE) ×1 IMPLANT
SUT MON AB 5-0 PS2 18 (SUTURE) ×3 IMPLANT
SUT MONOCRYL 6-0 P1 1X18 (SUTURE)
SUT NYLON ETHILON 5-0 P-3 1X18 (SUTURE) IMPLANT
SUT PLAIN 5 0 P 3 18 (SUTURE) IMPLANT
SUT VIC AB 5-0 P-3 18X BRD (SUTURE) IMPLANT
SUT VIC AB 5-0 P3 18 (SUTURE) ×3
SUT VICRYL 4-0 PS2 18IN ABS (SUTURE) IMPLANT
SWABSTICK POVIDONE IODINE SNGL (MISCELLANEOUS) ×1 IMPLANT
SYR BULB 3OZ (MISCELLANEOUS) ×1 IMPLANT
SYR CONTROL 10ML LL (SYRINGE) ×3 IMPLANT
TOWEL OR 17X24 6PK STRL BLUE (TOWEL DISPOSABLE) ×4 IMPLANT
TRAY DSU PREP LF (CUSTOM PROCEDURE TRAY) ×2 IMPLANT
TUBE CONNECTING 20X1/4 (TUBING) IMPLANT
YANKAUER SUCT BULB TIP NO VENT (SUCTIONS) IMPLANT

## 2017-07-29 NOTE — Transfer of Care (Signed)
Immediate Anesthesia Transfer of Care Note  Patient: Keith Hernandez  Procedure(s) Performed: EXCISION BACK LIPOMA (Right Back) EXCISION LIP LESION (Bilateral Mouth)  Patient Location: PACU  Anesthesia Type:MAC  Level of Consciousness: awake, alert  and oriented  Airway & Oxygen Therapy: Patient Spontanous Breathing  Post-op Assessment: Report given to RN and Post -op Vital signs reviewed and stable  Post vital signs: Reviewed and stable  Last Vitals:  Vitals:   07/29/17 0639 07/29/17 0830  BP: (!) 158/91 126/76  Pulse: 74   Resp: 18   Temp: 36.6 C   SpO2: 99%     Last Pain:  Vitals:   07/29/17 0639  TempSrc: Oral         Complications: No apparent anesthesia complications

## 2017-07-29 NOTE — Interval H&P Note (Signed)
History and Physical Interval Note:  07/29/2017 7:26 AM  Keith Hernandez  has presented today for surgery, with the diagnosis of LIPOMA OF TORSO  The various methods of treatment have been discussed with the patient and family. After consideration of risks, benefits and other options for treatment, the patient has consented to  Procedure(s): EXCISION BACK LIPOMA (N/A) EXCISION LIP LESION (N/A) as a surgical intervention .  The patient's history has been reviewed, patient examined, no change in status, stable for surgery.  I have reviewed the patient's chart and labs.  Questions were answered to the patient's satisfaction.     [name]

## 2017-07-29 NOTE — Anesthesia Postprocedure Evaluation (Signed)
Anesthesia Post Note  Patient: Keith Hernandez  Procedure(s) Performed: EXCISION BACK LIPOMA (Right Back) EXCISION LIP LESION (Bilateral Mouth)     Patient location during evaluation: PACU Anesthesia Type: MAC Level of consciousness: awake and alert Pain management: pain level controlled Vital Signs Assessment: post-procedure vital signs reviewed and stable Respiratory status: spontaneous breathing and respiratory function stable Cardiovascular status: stable Postop Assessment: no apparent nausea or vomiting Anesthetic complications: no    Last Vitals:  Vitals:   07/29/17 0900 07/29/17 0944  BP: 123/83 (!) 148/82  Pulse: 76 65  Resp: (!) 21 16  Temp:  36.4 C  SpO2: 97% 100%    Last Pain:  Vitals:   07/29/17 0944  TempSrc: Oral                 Milano Rosevear DANIEL

## 2017-07-29 NOTE — Op Note (Signed)
DATE OF OPERATION: 07/29/2017  LOCATION: [facility]  PREOPERATIVE DIAGNOSIS: Lipoma of right posterior shoulder, changing skin lesion of lower lip x 2  POSTOPERATIVE DIAGNOSIS: Same  PROCEDURE: Excision of posterior shoulder lipoma 6 x 8 cm, excision of changing skin lesion lower lip right 1 x 1 cm and left 1 x 2 cm  SURGEON: [place] [facility] Coretha Creswell, DO  ASSISTANT: Shawn Rayburn, PA  EBL: 2 cc  CONDITION: Stable  COMPLICATIONS: None  INDICATION: The patient, Keith Hernandez, is a 66 y.o. male born on [birth year], is here for treatment of changing skin lesions of the lower lip which are likely angiomas and lipoma of the right posterior shoulder area.  PROCEDURE DETAILS:  The patient was seen prior to surgery and marked.  The IV antibiotics were given. The patient was taken to the operating room and given a general anesthetic. A standard time out was performed and all information was confirmed by those in the room. SCDs were placed.   The patient was given light sedation and placed in the prone position.  He was prepped and draped in the usual sterile fashion.  Local was injected.  The #15 blade was used to make an incision in the skin.  The bovie was used to dissect to the mass and free it from the surrounding soft tissue.  It was light in color and consistent with a lipoma.  It was removed in total for a size of 6 x 8 cm.  The deep layers were closed with 3-0 Monocryl followed by a running 4-0 and 5-0 Monocryl for subcutaneous closure.  Derma bond and steri strips were applied.  A sterile dressing was placed.  The patient was then asked to roll onto his back and placed in the supine position.  He was prepped with betadine.  The local was injected.  The needle tip bovie was used to excise the left lower lip lesion for a lesion of 1 x 2 cm in size.  The lip was closed with the 5-0 Vicryl with vertical mattress sutures.  The right lesion was then removed with the bovie for a lesion  that was 1 x 1 cm in size.  The base of the lesion what looked like a hemangioma was bovied.  The sin was closed with the 5-0 Vicryl using vertical mattress sutures.  Antibiotic ointment was applied.  The patient was allowed to wake up and taken to recovery room in stable condition at the end of the case. The family was notified at the end of the case.

## 2017-07-29 NOTE — Discharge Instructions (Signed)
Soft diet and nothing hot to eat or drink until dinner tonight since there is numbing medicine in his lip.  This is to prevent a burn.   Post Anesthesia Home Care Instructions  Activity: Get plenty of rest for the remainder of the day. A responsible individual must stay with you for 24 hours following the procedure.  For the next 24 hours, DO NOT: -Drive a car -Paediatric nurse -Drink alcoholic beverages -Take any medication unless instructed by your physician -Make any legal decisions or sign important papers.  Meals: Start with liquid foods such as gelatin or soup. Progress to regular foods as tolerated. Avoid greasy, spicy, heavy foods. If nausea and/or vomiting occur, drink only clear liquids until the nausea and/or vomiting subsides. Call your physician if vomiting continues.  Special Instructions/Symptoms: Your throat may feel dry or sore from the anesthesia or the breathing tube placed in your throat during surgery. If this causes discomfort, gargle with warm salt water. The discomfort should disappear within 24 hours.  If you had a scopolamine patch placed behind your ear for the management of post- operative nausea and/or vomiting:  1. The medication in the patch is effective for 72 hours, after which it should be removed.  Wrap patch in a tissue and discard in the trash. Wash hands thoroughly with soap and water. 2. You may remove the patch earlier than 72 hours if you experience unpleasant side effects which may include dry mouth, dizziness or visual disturbances. 3. Avoid touching the patch. Wash your hands with soap and water after contact with the patch.

## 2017-07-30 ENCOUNTER — Encounter (HOSPITAL_BASED_OUTPATIENT_CLINIC_OR_DEPARTMENT_OTHER): Payer: Self-pay | Admitting: Plastic Surgery

## 2017-10-04 DIAGNOSIS — M1612 Unilateral primary osteoarthritis, left hip: Secondary | ICD-10-CM | POA: Diagnosis not present

## 2017-10-12 DIAGNOSIS — M25552 Pain in left hip: Secondary | ICD-10-CM | POA: Diagnosis not present

## 2017-10-12 DIAGNOSIS — M1612 Unilateral primary osteoarthritis, left hip: Secondary | ICD-10-CM | POA: Diagnosis not present

## 2017-10-19 DIAGNOSIS — M25552 Pain in left hip: Secondary | ICD-10-CM | POA: Diagnosis not present

## 2017-10-19 DIAGNOSIS — M1612 Unilateral primary osteoarthritis, left hip: Secondary | ICD-10-CM | POA: Diagnosis not present

## 2017-10-21 DIAGNOSIS — M1612 Unilateral primary osteoarthritis, left hip: Secondary | ICD-10-CM | POA: Diagnosis not present

## 2017-10-21 DIAGNOSIS — M25552 Pain in left hip: Secondary | ICD-10-CM | POA: Diagnosis not present

## 2017-10-25 DIAGNOSIS — M1612 Unilateral primary osteoarthritis, left hip: Secondary | ICD-10-CM | POA: Diagnosis not present

## 2017-10-25 DIAGNOSIS — M25552 Pain in left hip: Secondary | ICD-10-CM | POA: Diagnosis not present

## 2017-11-01 DIAGNOSIS — M25552 Pain in left hip: Secondary | ICD-10-CM | POA: Diagnosis not present

## 2017-11-01 DIAGNOSIS — M1612 Unilateral primary osteoarthritis, left hip: Secondary | ICD-10-CM | POA: Diagnosis not present

## 2017-11-04 DIAGNOSIS — M1612 Unilateral primary osteoarthritis, left hip: Secondary | ICD-10-CM | POA: Diagnosis not present

## 2017-11-04 DIAGNOSIS — M25552 Pain in left hip: Secondary | ICD-10-CM | POA: Diagnosis not present

## 2017-11-08 DIAGNOSIS — M25552 Pain in left hip: Secondary | ICD-10-CM | POA: Diagnosis not present

## 2017-11-08 DIAGNOSIS — M1612 Unilateral primary osteoarthritis, left hip: Secondary | ICD-10-CM | POA: Diagnosis not present

## 2017-11-15 DIAGNOSIS — M1612 Unilateral primary osteoarthritis, left hip: Secondary | ICD-10-CM | POA: Diagnosis not present

## 2017-11-15 DIAGNOSIS — M25552 Pain in left hip: Secondary | ICD-10-CM | POA: Diagnosis not present

## 2017-11-29 DIAGNOSIS — M25552 Pain in left hip: Secondary | ICD-10-CM | POA: Diagnosis not present

## 2018-01-14 DIAGNOSIS — E559 Vitamin D deficiency, unspecified: Secondary | ICD-10-CM | POA: Diagnosis not present

## 2018-01-14 DIAGNOSIS — I10 Essential (primary) hypertension: Secondary | ICD-10-CM | POA: Diagnosis not present

## 2018-01-14 DIAGNOSIS — R82998 Other abnormal findings in urine: Secondary | ICD-10-CM | POA: Diagnosis not present

## 2018-01-14 DIAGNOSIS — Z125 Encounter for screening for malignant neoplasm of prostate: Secondary | ICD-10-CM | POA: Diagnosis not present

## 2018-01-14 DIAGNOSIS — E7849 Other hyperlipidemia: Secondary | ICD-10-CM | POA: Diagnosis not present

## 2018-01-18 DIAGNOSIS — Z1389 Encounter for screening for other disorder: Secondary | ICD-10-CM | POA: Diagnosis not present

## 2018-01-18 DIAGNOSIS — G43909 Migraine, unspecified, not intractable, without status migrainosus: Secondary | ICD-10-CM | POA: Diagnosis not present

## 2018-01-18 DIAGNOSIS — I10 Essential (primary) hypertension: Secondary | ICD-10-CM | POA: Diagnosis not present

## 2018-01-18 DIAGNOSIS — M5126 Other intervertebral disc displacement, lumbar region: Secondary | ICD-10-CM | POA: Diagnosis not present

## 2018-01-18 DIAGNOSIS — Z6828 Body mass index (BMI) 28.0-28.9, adult: Secondary | ICD-10-CM | POA: Diagnosis not present

## 2018-01-18 DIAGNOSIS — M25552 Pain in left hip: Secondary | ICD-10-CM | POA: Diagnosis not present

## 2018-01-18 DIAGNOSIS — R1319 Other dysphagia: Secondary | ICD-10-CM | POA: Diagnosis not present

## 2018-01-18 DIAGNOSIS — E7849 Other hyperlipidemia: Secondary | ICD-10-CM | POA: Diagnosis not present

## 2018-01-18 DIAGNOSIS — K219 Gastro-esophageal reflux disease without esophagitis: Secondary | ICD-10-CM | POA: Diagnosis not present

## 2018-01-18 DIAGNOSIS — I714 Abdominal aortic aneurysm, without rupture: Secondary | ICD-10-CM | POA: Diagnosis not present

## 2018-01-18 DIAGNOSIS — Z0000 Encounter for general adult medical examination without abnormal findings: Secondary | ICD-10-CM | POA: Diagnosis not present

## 2018-01-26 ENCOUNTER — Encounter (HOSPITAL_COMMUNITY): Payer: Medicare Other

## 2018-01-26 ENCOUNTER — Ambulatory Visit: Payer: Medicare Other | Admitting: Family

## 2018-01-26 ENCOUNTER — Other Ambulatory Visit (HOSPITAL_COMMUNITY): Payer: Medicare Other

## 2018-01-27 ENCOUNTER — Ambulatory Visit (HOSPITAL_COMMUNITY)
Admission: RE | Admit: 2018-01-27 | Discharge: 2018-01-27 | Disposition: A | Discharge status: Home / Self Care | Payer: Medicare Other | Source: Ambulatory Visit | Attending: Family | Admitting: Family

## 2018-01-27 ENCOUNTER — Ambulatory Visit (INDEPENDENT_AMBULATORY_CARE_PROVIDER_SITE_OTHER)
Admission: RE | Admit: 2018-01-27 | Discharge: 2018-01-27 | Disposition: A | Discharge status: Home / Self Care | Payer: Medicare Other | Source: Ambulatory Visit | Attending: Family | Admitting: Family

## 2018-01-27 ENCOUNTER — Ambulatory Visit (INDEPENDENT_AMBULATORY_CARE_PROVIDER_SITE_OTHER): Payer: Medicare Other | Admitting: Family

## 2018-01-27 ENCOUNTER — Encounter: Payer: Self-pay | Admitting: Family

## 2018-01-27 VITALS — BP 158/98 | HR 74 | Temp 97.1°F | Resp 18 | Ht 71.0 in | Wt 206.0 lb

## 2018-01-27 DIAGNOSIS — I714 Abdominal aortic aneurysm, without rupture, unspecified: Secondary | ICD-10-CM

## 2018-01-27 DIAGNOSIS — I719 Aortic aneurysm of unspecified site, without rupture: Secondary | ICD-10-CM | POA: Insufficient documentation

## 2018-01-27 DIAGNOSIS — R0989 Other specified symptoms and signs involving the circulatory and respiratory systems: Secondary | ICD-10-CM | POA: Diagnosis not present

## 2018-01-27 DIAGNOSIS — Z87891 Personal history of nicotine dependence: Secondary | ICD-10-CM

## 2018-01-27 DIAGNOSIS — Z1212 Encounter for screening for malignant neoplasm of rectum: Secondary | ICD-10-CM | POA: Diagnosis not present

## 2018-01-27 NOTE — Patient Instructions (Signed)
Before your next abdominal ultrasound:  Take two Extra-Strength Gas-X capsules at bedtime the night before the test. Take another two Extra-Strength Gas-X capsules 3 hours before the test.  Avoid gas forming foods and beverages the day before the test.      Abdominal Aortic Aneurysm Blood pumps away from the heart through tubes (blood vessels) called arteries. Aneurysms are weak or damaged places in the wall of an artery. It bulges out like a balloon. An abdominal aortic aneurysm happens in the main artery of the body (aorta). It can burst or tear, causing bleeding inside the body. This is an emergency. It needs treatment right away. What are the causes? The exact cause is unknown. Things that could cause this problem include:  Fat and other substances building up in the lining of a tube.  Swelling of the walls of a blood vessel.  Certain tissue diseases.  Belly (abdominal) trauma.  An infection in the main artery of the body.  What increases the risk? There are things that make it more likely for you to have an aneurysm. These include:  Being over the age of 68 years old.  Having high blood pressure (hypertension).  Being a male.  Being white.  Being very overweight (obese).  Having a family history of aneurysm.  Using tobacco products.  What are the signs or symptoms? Symptoms depend on the size of the aneurysm and how fast it grows. There may not be symptoms. If symptoms occur, they can include:  Pain (belly, side, lower back, or groin).  Feeling full after eating a small amount of food.  Feeling sick to your stomach (nauseous), throwing up (vomiting), or both.  Feeling a lump in your belly that feels like it is beating (pulsating).  Feeling like you will pass out (faint).  How is this treated?  Medicine to control blood pressure and pain.  Imaging tests to see if the aneurysm gets bigger.  Surgery. How is this prevented? To lessen your chance of  getting this condition:  Stop smoking. Stop chewing tobacco.  Limit or avoid alcohol.  Keep your blood pressure, blood sugar, and cholesterol within normal limits.  Eat less salt.  Eat foods low in saturated fats and cholesterol. These are found in animal and whole dairy products.  Eat more fiber. Fiber is found in whole grains, vegetables, and fruits.  Keep a healthy weight.  Stay active and exercise often.  This information is not intended to replace advice given to you by your health care provider. Make sure you discuss any questions you have with your health care provider. Document Released: 12/26/2012 Document Revised: 02/06/2016 Document Reviewed: 09/30/2012 Elsevier Interactive Patient Education  2017 [employer]  

## 2018-01-27 NOTE — Progress Notes (Signed)
[facility]   CC: Follow up Abdominal Aortic Aneurysm  History of Present Illness  Keith Hernandez is a 67 y.o. (05-27-1951) male who is here for follow-up of a abdominal aortic aneurysm. This was initially detected in 2007 with a CT angiogram. He had a follow-up CT scan in 2015 which was done for abdominal pain. At that time his aneurysm measured 2.9 cm. An ultrasound in 2016 showed a 3.9 cm aneurysm, indicating 1 cm of growth within a year. Dr. Trula Slade was uncomfortable about the measurements on his follow-up ultrasound and therefore wanted to have it repeated; that duplex measured 3.24.cm.   Pt denies any abdominal pain or back pain.  He denies any known history of stroke or TIA. He denies any claudication symptoms with walking. He denies any history of cardiac problems other than when he was drinking too much coffee he had periods of palpations. The palpitations stopped when he stopped drinking coffee.   The patient suffers from hypercholesterolemia. He stopped the statin as he felt "horrible, everything from the waist down ached, very little energy". He also takes a daily aspirin and multivitamin.  He stopped tobacco use in 2000. He does not have DM.   Pt states that he has white coat syndrome in re to his hypertension. He states at home, his blood pressure at home is 130's/80's  Past Medical History:  Diagnosis Date  . AAA (abdominal aortic aneurysm) ([place])   . Arthritis   . Bulging of cervical intervertebral disc    C6-7  . Dupuytren's disease   . GERD (gastroesophageal reflux disease)    OTC meds pnr  . Hyperlipidemia   . Hypertension   . Migraines    Past Surgical History:  Procedure Laterality Date  . achilles reattachment    . FASCIOTOMY Left 10/11/2015   Procedure: LEFT HAND SUBTOTAL PALMAR FASCIOTOMY WITH REPAIR RECONSTRUCTION ;  Surgeon: Roseanne Kaufman, MD;  Location: [facility];  Service: Orthopedics;  Laterality:  Left;  . LESION DESTRUCTION Bilateral 07/29/2017   Procedure: EXCISION LIP LESION;  Surgeon: [name], DO;  Location: [facility];  Service: Plastics;  Laterality: Bilateral;  . LIPOMA EXCISION Right 07/29/2017   Procedure: EXCISION BACK LIPOMA;  Surgeon: [name], DO;  Location: [facility];  Service: Plastics;  Laterality: Right;   Social History Social History   Socioeconomic History  . Marital status: Married    Spouse name: Not on file  . Number of children: Not on file  . Years of education: Not on file  . Highest education level: Not on file  Occupational History  . Not on file  Social Needs  . Financial resource strain: Not on file  . Food insecurity:    Worry: Not on file    Inability: Not on file  . Transportation needs:    Medical: Not on file    Non-medical: Not on file  Tobacco Use  . Smoking status: Former Smoker    Packs/day: 1.00    Types: Cigarettes    Last attempt to quit: 07/15/1999    Years since quitting: 18.5  . Smokeless tobacco: Never Used  Substance and Sexual Activity  . Alcohol use: Yes    Alcohol/week: 4.2 oz    Types: 5 Glasses of wine, 2 Shots of liquor per week    Comment: social  . Drug use: No  . Sexual activity: Not on file  Lifestyle  . Physical activity:  Days per week: Not on file    Minutes per session: Not on file  . Stress: Not on file  Relationships  . Social connections:    Talks on phone: Not on file    Gets together: Not on file    Attends religious service: Not on file    Active member of club or organization: Not on file    Attends meetings of clubs or organizations: Not on file    Relationship status: Not on file  . Intimate partner violence:    Fear of current or ex partner: Not on file    Emotionally abused: Not on file    Physically abused: Not on file    Forced sexual activity: Not on file  Other Topics Concern  . Not on file  Social History Narrative  .  Not on file   Family History Family History  Problem Relation Age of Onset  . Arthritis Mother   . Heart disease Mother   . Heart attack Mother   . Asthma Father   . Asthma Paternal Grandfather     Current Outpatient Medications on File Prior to Visit  Medication Sig Dispense Refill  . aspirin 81 MG tablet Take 81 mg by mouth daily.    Marland Kitchen loratadine (CLARITIN) 10 MG tablet Take 10 mg daily by mouth.    . losartan (COZAAR) 100 MG tablet Take 50 mg by mouth 2 (two) times daily.  5  . mometasone (NASONEX) 50 MCG/ACT nasal spray Place into the nose.    . Multiple Vitamin (MULTIVITAMIN) tablet Take 1 tablet by mouth daily.    . vitamin C (ASCORBIC ACID) 500 MG tablet Take 500 mg daily by mouth.    . losartan (COZAAR) 50 MG tablet Take 50 mg by mouth 2 (two) times daily.      No current facility-administered medications on file prior to visit.    No Known Allergies  ROS: See HPI for pertinent positives and negatives.  Physical Examination  Vitals:   01/27/18 0922  BP: (!) 158/98  Pulse: 74  Resp: 18  Temp: (!) 97.1 F (36.2 C)  TempSrc: Oral  SpO2: 99%  Weight: 206 lb (93.4 kg)  Height: 5\' 11"  (1.803 m)   Body mass index is 28.73 kg/m.  General: A&O x 3, WD, overweight male. HEENT: no gross abnormalities.  Pulmonary: Sym exp, respirations are non labored, good air movt, CTAB, no rales, rhonchi, or wheezing. Cardiac: RRR, Nl S1, S2, no detected murmur.  Carotid Bruits Right Left   Negative Negative    Abdominal aortic pulse is not palpable Radial pulses are 2+ palpable bilaterally                          VASCULAR EXAM:  LE Pulses Right Left       FEMORAL  not palpable  not palpable        POPLITEAL  3+ palpable   2+ palpable       POSTERIOR TIBIAL  1+ palpable   1+ palpable        DORSALIS PEDIS      ANTERIOR TIBIAL  2+ palpable  2+ palpable     Gastrointestinal: soft, NTND, -G/R, - HSM, - masses palpated, - CVAT B. Musculoskeletal: M/S 5/5 throughout, Extremities without ischemic changes. Neurologic: CN 2-12 intact, Pain and light touch intact in extremities are intact, Motor exam as listed above. Skin: No rashes, no ulcers, no cellulitis.   Psychiatric: Normal thought content, mood appropriate to clinical situation.    DATA  AAA Duplex (01/27/2018):  Previous size: 3.9 cm (Date: 01/25/17), Right CIA: 1.4 cm; Left CIA: 1.4 cm. Technically difficult exam due to bowel gas.    Current size:  4.1 cm, distal segment (Date: 01/27/18); Right CIA: 1.8 cm; Left CIA: 1.5 cm. Plaque noted on the posterior wall of the distal aorta. Limitations: air/bowel gas.   Medical Decision Making  The patient is a 67 y.o. male who presents with asymptomatic AAA with slight increase in size from 3.9 cm to 4.1 cm in a year, based on limited visualization.  He has prominent bilateral popliteal pulses with no claudication symptoms in his legs with walking, no signs if ischemia in his feet or legs, with palpable bilateral pedal pulses.   Consideration for repair of AAA would be made when the size is 5.0 cm, growth > 1 cm/yr, and symptomatic status.   Based on this patient's exam and diagnostic studies, the patient will follow up in 1 year with the following studies: AAA duplex.        The patient was given information about AAA including signs, symptoms, treatment, and how to minimize the risk of enlargement and rupture of aneurysms.    I emphasized the importance of maximal medical management including strict control of blood pressure, blood glucose, and lipid levels, antiplatelet agents, obtaining regular exercise, and continued  cessation of smoking.   The patient was advised to call 911 should the patient experience sudden onset abdominal or back pain.   Thank you for allowing [place] to participate in this patient's  care.  Clemon Chambers, RN, MSN, FNP-C [facility] Office: [facility] Physician: Oneida Alar  01/27/2018, 9:24 AM

## 2018-01-31 ENCOUNTER — Other Ambulatory Visit (HOSPITAL_COMMUNITY): Payer: Medicare Other

## 2018-01-31 ENCOUNTER — Encounter (HOSPITAL_COMMUNITY): Payer: Medicare Other

## 2018-01-31 ENCOUNTER — Ambulatory Visit: Payer: Medicare Other | Admitting: Family

## 2018-06-30 DIAGNOSIS — Z23 Encounter for immunization: Secondary | ICD-10-CM | POA: Diagnosis not present

## 2018-07-20 DIAGNOSIS — D2271 Melanocytic nevi of right lower limb, including hip: Secondary | ICD-10-CM | POA: Diagnosis not present

## 2018-07-20 DIAGNOSIS — L570 Actinic keratosis: Secondary | ICD-10-CM | POA: Diagnosis not present

## 2018-07-20 DIAGNOSIS — D2362 Other benign neoplasm of skin of left upper limb, including shoulder: Secondary | ICD-10-CM | POA: Diagnosis not present

## 2018-07-20 DIAGNOSIS — L814 Other melanin hyperpigmentation: Secondary | ICD-10-CM | POA: Diagnosis not present

## 2018-07-20 DIAGNOSIS — L918 Other hypertrophic disorders of the skin: Secondary | ICD-10-CM | POA: Diagnosis not present

## 2018-07-20 DIAGNOSIS — D2262 Melanocytic nevi of left upper limb, including shoulder: Secondary | ICD-10-CM | POA: Diagnosis not present

## 2018-07-20 DIAGNOSIS — L821 Other seborrheic keratosis: Secondary | ICD-10-CM | POA: Diagnosis not present

## 2018-07-20 DIAGNOSIS — D692 Other nonthrombocytopenic purpura: Secondary | ICD-10-CM | POA: Diagnosis not present

## 2018-07-20 DIAGNOSIS — D1801 Hemangioma of skin and subcutaneous tissue: Secondary | ICD-10-CM | POA: Diagnosis not present

## 2018-07-20 DIAGNOSIS — D225 Melanocytic nevi of trunk: Secondary | ICD-10-CM | POA: Diagnosis not present

## 2018-07-27 ENCOUNTER — Encounter (INDEPENDENT_AMBULATORY_CARE_PROVIDER_SITE_OTHER): Payer: Self-pay | Admitting: Orthopaedic Surgery

## 2018-07-27 ENCOUNTER — Ambulatory Visit (INDEPENDENT_AMBULATORY_CARE_PROVIDER_SITE_OTHER): Payer: Medicare Other

## 2018-07-27 ENCOUNTER — Ambulatory Visit (INDEPENDENT_AMBULATORY_CARE_PROVIDER_SITE_OTHER): Payer: Medicare Other | Admitting: Orthopaedic Surgery

## 2018-07-27 DIAGNOSIS — M25552 Pain in left hip: Secondary | ICD-10-CM | POA: Diagnosis not present

## 2018-07-27 DIAGNOSIS — M1612 Unilateral primary osteoarthritis, left hip: Secondary | ICD-10-CM | POA: Diagnosis not present

## 2018-07-27 NOTE — Progress Notes (Signed)
Office Visit Note   Patient: Keith Hernandez           Date of Birth: [birth year]           MRN: [id number] Visit Date: 07/27/2018              Requested by: Velna Hatchet, MD [address] [place], [place] [address] PCP: Velna Hatchet, MD   Assessment & Plan: Visit Diagnoses:  1. Pain in left hip   2. Unilateral primary osteoarthritis, left hip     Plan: I went over his x-rays with him in detail.  I showed him a hip model explained in detail with hip replacement surgery involves.  I have given a handout on direct anterior hip surgery.  We had a long and thorough discussion about the risk and benefits of the surgery.  I talked to him about the intraoperative and postoperative course and what to expect before and after surgery.  I showed him a hip model and a real total hip.  All question concerns were answered and addressed.  I do feel he is appropriate candidate to have this done and that it is necessary at this point given the very conservative treatment for over a year now and given the fact that this is now detrimentally impact his mobility and his quality of life.  All question concerns were answered and addressed.  He would like to have this in January 2020 so we will work on getting it scheduled.  Follow-Up Instructions: Return for 2 weeks post-op.   Orders:  Orders Placed This Encounter  Procedures  . XR HIP UNILAT W OR W/O PELVIS 1V LEFT   No orders of the defined types were placed in this encounter.     Procedures: No procedures performed   Clinical Data: No additional findings.   Subjective: Chief Complaint  Patient presents with  . Left Hip - Pain  The patient is a incredibly pleasant and active 67 year old gentleman who is been experiencing worsening left hip pain for quite some time.  Is really gotten worse for over a year now.  Is a lot of pain in the groin as well as stiffness in that hip.  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  He has been seen by another orthopedic surgeon in town who did recommend hip replacement surgery.  He then sought me out for considering that surgery.  He says at times his pain is daily and it is detrimentally affecting his activity living, his quality of life and his mobility.  He does have some problems getting out of the car and pivoting that hip and again his pain seems be mainly in the groin.  He is tried and failed all forms conservative treatment.  He is active.  He is work on activity modification and anti-inflammatories.  He is already thin and does not need to lose weight.  He does have a history of an abdominal aneurysm that is been followed by vascular surgery and is been stable.  He has no other significant active medical issues.  He is not on blood thinning medication.  HPI  Review of Systems He currently denies any headache, chest pain, short of breath, fever, chills, nausea, vomiting.  Objective: Vital Signs: There were no vitals taken for this visit.  Physical Exam He is alert and oriented x3 and in no acute distress Ortho Exam  Examination of his right hip is normal examination of his left hip shows significant pain on internal or external rotation and severe stiffness as well.  His leg lengths appear equal. Specialty Comments:  No specialty comments available.  Imaging: Xr Hip Unilat W Or W/o Pelvis 1v Left  Result Date: 07/27/2018 An AP pelvis and lateral of the left hip show significant arthritis in the left hip.  There is joint space narrowing especially at the superior lateral aspect.  There are sclerotic changes in the acetabulum and the femoral head.  There is slight flattening of the femoral head laterally.  There are para-articular osteophytes as well.  There is mild arthritis in the right hip.    PMFS History: Patient Active Problem List   Diagnosis Date Noted  . Unilateral primary osteoarthritis, left hip  07/27/2018   Past Medical History:  Diagnosis Date  . AAA (abdominal aortic aneurysm) ([place])   . Arthritis   . Bulging of cervical intervertebral disc    C6-7  . Dupuytren's disease   . GERD (gastroesophageal reflux disease)    OTC meds pnr  . Hyperlipidemia   . Hypertension   . Migraines     Family History  Problem Relation Age of Onset  . Arthritis Mother   . Heart disease Mother   . Heart attack Mother   . Asthma Father   . Asthma Paternal Grandfather     Past Surgical History:  Procedure Laterality Date  . achilles reattachment    . FASCIOTOMY Left 10/11/2015   Procedure: LEFT HAND SUBTOTAL PALMAR FASCIOTOMY WITH REPAIR RECONSTRUCTION ;  Surgeon: Roseanne Kaufman, MD;  Location: [facility];  Service: Orthopedics;  Laterality: Left;  . LESION DESTRUCTION Bilateral 07/29/2017   Procedure: EXCISION LIP LESION;  Surgeon: [name], DO;  Location: [facility];  Service: Plastics;  Laterality: Bilateral;  . LIPOMA EXCISION Right 07/29/2017   Procedure: EXCISION BACK LIPOMA;  Surgeon: [name], DO;  Location: [facility];  Service: Plastics;  Laterality: Right;   Social History   Occupational History  . Not on file  Tobacco Use  . Smoking status: Former Smoker    Packs/day: 1.00    Types: Cigarettes    Last attempt to quit: 07/15/1999    Years since quitting: 19.0  . Smokeless tobacco: Never Used  Substance and Sexual Activity  . Alcohol use: Yes    Alcohol/week: 7.0 standard drinks    Types: 5 Glasses of wine, 2 Shots of liquor per week    Comment: social  . Drug use: No  . Sexual activity: Not on file

## 2018-08-29 DIAGNOSIS — H2513 Age-related nuclear cataract, bilateral: Secondary | ICD-10-CM | POA: Diagnosis not present

## 2018-09-22 ENCOUNTER — Other Ambulatory Visit (INDEPENDENT_AMBULATORY_CARE_PROVIDER_SITE_OTHER): Payer: Self-pay | Admitting: Physician Assistant

## 2018-09-23 ENCOUNTER — Other Ambulatory Visit: Payer: Self-pay | Admitting: Orthopaedic Surgery

## 2018-09-23 NOTE — Progress Notes (Signed)
01-27-18 (Epic) LOV w/Vascular

## 2018-09-23 NOTE — Patient Instructions (Signed)
Keith Hernandez  09/23/2018   Your procedure is scheduled on: 09-30-18    Report to [facility] Main  Entrance    Report to Admitting at 6:00 AM    Call this number if you have problems the morning of surgery [phone]    Remember: Do not eat food or drink liquids :After Midnight.    BRUSH YOUR TEETH MORNING OF SURGERY AND RINSE YOUR MOUTH OUT, NO CHEWING GUM CANDY OR MINTS.     Take these medicines the morning of surgery with A SIP OF WATER: None                                 You may not have any metal on your body including hair pins and              piercings  Do not wear jewelry, cologne, lotions, powders or deodorant             Men may shave face and neck.   Do not bring valuables to the hospital. [place].  Contacts, dentures or bridgework may not be worn into surgery.  Leave suitcase in the car. After surgery it may be brought to your room.     Patients discharged the day of surgery will not be allowed to drive home. IF YOU ARE HAVING SURGERY AND GOING HOME THE SAME DAY, YOU MUST HAVE AN ADULT TO DRIVE YOU HOME AND BE WITH YOU FOR 24 HOURS. YOU MAY GO HOME BY TAXI OR UBER OR ORTHERWISE, BUT AN ADULT MUST ACCOMPANY YOU HOME AND STAY WITH YOU FOR 24 HOURS.     Special Instructions: N/A              Please read over the following fact sheets you were given: _____________________________________________________________________             [facility] - Preparing for Surgery Before surgery, you can play an important role.  Because skin is not sterile, your skin needs to be as free of germs as possible.  You can reduce the number of germs on your skin by washing with CHG (chlorahexidine gluconate) soap before surgery.  CHG is an antiseptic cleaner which kills germs and bonds with the skin to continue killing germs even after washing. Please DO NOT use if you have an allergy to CHG or antibacterial  soaps.  If your skin becomes reddened/irritated stop using the CHG and inform your nurse when you arrive at Short Stay. Do not shave (including legs and underarms) for at least 48 hours prior to the first CHG shower.  You may shave your face/neck. Please follow these instructions carefully:  1.  Shower with CHG Soap the night before surgery and the  morning of Surgery.  2.  If you choose to wash your hair, wash your hair first as usual with your  normal  shampoo.  3.  After you shampoo, rinse your hair and body thoroughly to remove the  shampoo.                           4.  Use CHG as you would any other liquid soap.  You can apply chg  directly  to the skin and wash                       Gently with a scrungie or clean washcloth.  5.  Apply the CHG Soap to your body ONLY FROM THE NECK DOWN.   Do not use on face/ open                           Wound or open sores. Avoid contact with eyes, ears mouth and genitals (private parts).                       Wash face,  Genitals (private parts) with your normal soap.             6.  Wash thoroughly, paying special attention to the area where your surgery  will be performed.  7.  Thoroughly rinse your body with warm water from the neck down.  8.  DO NOT shower/wash with your normal soap after using and rinsing off  the CHG Soap.                9.  Pat yourself dry with a clean towel.            10.  Wear clean pajamas.            11.  Place clean sheets on your bed the night of your first shower and do not  sleep with pets. Day of Surgery : Do not apply any lotions/deodorants the morning of surgery.  Please wear clean clothes to the hospital/surgery center.  FAILURE TO FOLLOW THESE INSTRUCTIONS MAY RESULT IN THE CANCELLATION OF YOUR SURGERY PATIENT SIGNATURE_________________________________  NURSE SIGNATURE__________________________________  ________________________________________________________________________   Keith Hernandez  An  incentive spirometer is a tool that can help keep your lungs clear and active. This tool measures how well you are filling your lungs with each breath. Taking long deep breaths may help reverse or decrease the chance of developing breathing (pulmonary) problems (especially infection) following:  A long period of time when you are unable to move or be active. BEFORE THE PROCEDURE   If the spirometer includes an indicator to show your best effort, your nurse or respiratory therapist will set it to a desired goal.  If possible, sit up straight or lean slightly forward. Try not to slouch.  Hold the incentive spirometer in an upright position. INSTRUCTIONS FOR USE  1. Sit on the edge of your bed if possible, or sit up as far as you can in bed or on a chair. 2. Hold the incentive spirometer in an upright position. 3. Breathe out normally. 4. Place the mouthpiece in your mouth and seal your lips tightly around it. 5. Breathe in slowly and as deeply as possible, raising the piston or the ball toward the top of the column. 6. Hold your breath for 3-5 seconds or for as long as possible. Allow the piston or ball to fall to the bottom of the column. 7. Remove the mouthpiece from your mouth and breathe out normally. 8. Rest for a few seconds and repeat Steps 1 through 7 at least 10 times every 1-2 hours when you are awake. Take your time and take a few normal breaths between deep breaths. 9. The spirometer may include an indicator to show your best effort. Use the indicator as a goal to work toward during each repetition. 10.  After each set of 10 deep breaths, practice coughing to be sure your lungs are clear. If you have an incision (the cut made at the time of surgery), support your incision when coughing by placing a pillow or rolled up towels firmly against it. Once you are able to get out of bed, walk around indoors and cough well. You may stop using the incentive spirometer when instructed by your  caregiver.  RISKS AND COMPLICATIONS  Take your time so you do not get dizzy or light-headed.  If you are in pain, you may need to take or ask for pain medication before doing incentive spirometry. It is harder to take a deep breath if you are having pain. AFTER USE  Rest and breathe slowly and easily.  It can be helpful to keep track of a log of your progress. Your caregiver can provide you with a simple table to help with this. If you are using the spirometer at home, follow these instructions: [place] IF:   You are having difficultly using the spirometer.  You have trouble using the spirometer as often as instructed.  Your pain medication is not giving enough relief while using the spirometer.  You develop fever of 100.5 F (38.1 C) or higher. SEEK IMMEDIATE MEDICAL CARE IF:   You cough up bloody sputum that had not been present before.  You develop fever of 102 F (38.9 C) or greater.  You develop worsening pain at or near the incision site. MAKE SURE YOU:   Understand these instructions.  Will watch your condition.  Will get help right away if you are not doing well or get worse. Document Released: 01/11/2007 Document Revised: 11/23/2011 Document Reviewed: 03/14/2007 [facility] Patient Information 2014 [place], [place].   ________________________________________________________________________

## 2018-09-26 ENCOUNTER — Other Ambulatory Visit: Payer: Self-pay

## 2018-09-26 ENCOUNTER — Encounter (HOSPITAL_COMMUNITY)
Admission: RE | Admit: 2018-09-26 | Discharge: 2018-09-26 | Disposition: A | Discharge status: Home / Self Care | Payer: Medicare Other | Source: Ambulatory Visit | Attending: Orthopaedic Surgery | Admitting: Orthopaedic Surgery

## 2018-09-26 ENCOUNTER — Other Ambulatory Visit: Payer: Self-pay | Admitting: Orthopaedic Surgery

## 2018-09-26 ENCOUNTER — Encounter (HOSPITAL_COMMUNITY): Payer: Self-pay

## 2018-09-26 DIAGNOSIS — Z01818 Encounter for other preprocedural examination: Secondary | ICD-10-CM | POA: Diagnosis not present

## 2018-09-26 LAB — CBC
HCT: 43.8 % (ref 39.0–52.0)
Hemoglobin: 14.1 g/dL (ref 13.0–17.0)
MCH: 28.8 pg (ref 26.0–34.0)
MCHC: 32.2 g/dL (ref 30.0–36.0)
MCV: 89.6 fL (ref 80.0–100.0)
Platelets: 198 10*3/uL (ref 150–400)
RBC: 4.89 MIL/uL (ref 4.22–5.81)
RDW: 13.2 % (ref 11.5–15.5)
WBC: 7.2 10*3/uL (ref 4.0–10.5)
nRBC: 0 % (ref 0.0–0.2)

## 2018-09-26 LAB — BASIC METABOLIC PANEL
Anion gap: 9 (ref 5–15)
BUN: 16 mg/dL (ref 8–23)
CO2: 26 mmol/L (ref 22–32)
Calcium: 9.2 mg/dL (ref 8.9–10.3)
Chloride: 104 mmol/L (ref 98–111)
Creatinine, Ser: 0.96 mg/dL (ref 0.61–1.24)
GFR calc Af Amer: >60 mL/min (ref >60)
GFR calc non Af Amer: >60 mL/min (ref >60)
Glucose, Bld: 103 mg/dL — ABNORMAL HIGH (ref 70–99)
Potassium: 4.4 mmol/L (ref 3.5–5.1)
Sodium: 139 mmol/L (ref 135–145)

## 2018-09-26 LAB — SURGICAL PCR SCREEN
MRSA, PCR: NEGATIVE
Staphylococcus aureus: NEGATIVE

## 2018-09-26 NOTE — Care Plan (Signed)
Spoke with patient and wife prior to surgery. He has all needed equipment at home. Questions answered.   Keith Hernandez, [place]

## 2018-09-27 ENCOUNTER — Other Ambulatory Visit: Payer: Self-pay | Admitting: Orthopaedic Surgery

## 2018-09-28 NOTE — Progress Notes (Signed)
Anesthesia Chart Review   Case:  [id number] Date/Time:  09/30/18 0815   Procedure:  LEFT TOTAL HIP ARTHROPLASTY ANTERIOR APPROACH (Left )   Anesthesia type:  Choice   Pre-op diagnosis:  Left hip osteoarthritis   Location:  [facility] ROOM 10 / [facility]   Surgeon:  Mcarthur Rossetti, MD      DISCUSSION: 68 yo former smoker (quit 07/15/99) with h/o migraines, HLD, HTN, GERD, AAA scheduled for above surgery with Dr. Jean Rosenthal on 09/30/18.    AAA detected on CT angiogram in 2017.  He is followed by Dr. Trula Slade.  He was last seen on 01/27/18 by Clemon Chambers, NP with Vascular Surgery.  Per her note AAA increased in size from 3.9 cm to 4.1 cm in one year.  Consider repair when size is 5.0 cm, growth >1 cm/yr, and pt symptomatic.  AAA duplex to be repeated in 1 year.    Pt can proceed with planned procedure barring acute status change.  VS: BP (!) 155/92   Pulse 67   Temp 36.6 C (Oral)   Resp 18   Ht 5' 11.5" (1.816 m)   Wt 95 kg   SpO2 98%   BMI 28.79 kg/m   PROVIDERS: Velna Hatchet, MD is PCP   Harold Barban, MD is Vascular Surgeon  LABS: Labs reviewed: Acceptable for surgery. (all labs ordered are listed, but only abnormal results are displayed)  Labs Reviewed  BASIC METABOLIC PANEL - Abnormal; Notable for the following components:      Result Value   Glucose, Bld 103 (*)    All other components within normal limits  SURGICAL PCR SCREEN  CBC     IMAGES: CT Abdomen 05/25/14 IMPRESSION: 1. No acute findings. 2. Mild to moderate distention of the rectum with stool with mild increased stool noted in the sigmoid colon. There are scattered colonic diverticula but no diverticulitis or evidence of colonic inflammation. No other bowel abnormality. 3. 2.9 cm infrarenal abdominal aortic aneurysm, increased in size from the prior CT. 4. Other chronic findings include degenerative changes of the visualized spine and mild enlargement of prostate  gland.  EKG: 09/26/2018 Rate 65 bpm Normal Sinus Rhythm  Normal ECG  CV: AAA Duplex (01/27/2018):  Previous size: 3.9cm (Date: 01/25/17), Right CIA: 1.4 cm; Left CIA: 1.4 cm. Technically difficult exam due to bowel gas.   Current size:  4.1 cm, distal segment (Date: 01/27/18); Right CIA: 1.8 cm; Left CIA: 1.5 cm. Plaque noted on the posterior wall of the distal aorta. Limitations: air/bowel gas.  Past Medical History:  Diagnosis Date  . AAA (abdominal aortic aneurysm) ([place])   . Arthritis   . Bulging of cervical intervertebral disc    C6-7  . Dupuytren's disease   . GERD (gastroesophageal reflux disease)    OTC meds pnr  . Hyperlipidemia   . Hypertension   . Migraines     Past Surgical History:  Procedure Laterality Date  . achilles reattachment    . FASCIOTOMY Left 10/11/2015   Procedure: LEFT HAND SUBTOTAL PALMAR FASCIOTOMY WITH REPAIR RECONSTRUCTION ;  Surgeon: Roseanne Kaufman, MD;  Location: [place];  Service: Orthopedics;  Laterality: Left;  . LESION DESTRUCTION Bilateral 07/29/2017   Procedure: EXCISION LIP LESION;  Surgeon: [name], DO;  Location: [place];  Service: Plastics;  Laterality: Bilateral;  . LIPOMA EXCISION Right 07/29/2017   Procedure: EXCISION BACK LIPOMA;  Surgeon: [name], DO;  Location: [place];  Service: Clinical cytogeneticist;  Laterality: Right;    MEDICATIONS: . aspirin 81 MG tablet  . Cholecalciferol (VITAMIN D3 PO)  . Glucosamine HCl (GLUCOSAMINE PO)  . ibuprofen (ADVIL,MOTRIN) 200 MG tablet  . Multiple Vitamin (MULTIVITAMIN) tablet  . olmesartan (BENICAR) 40 MG tablet  . triamcinolone (NASACORT ALLERGY 24HR) 55 MCG/ACT AERO nasal inhaler  . vitamin C (ASCORBIC ACID) 500 MG tablet   No current facility-administered medications for this encounter.

## 2018-09-28 NOTE — Anesthesia Preprocedure Evaluation (Addendum)
Anesthesia Evaluation  Patient identified by MRN, date of birth, ID band Patient awake    Reviewed: Allergy & Precautions, NPO status , Patient's Chart, lab work & pertinent test results  History of Anesthesia Complications Negative for: history of anesthetic complications  Airway Mallampati: II  TM Distance: >3 FB Neck ROM: Full    Dental  (+) Dental Advisory Given, Teeth Intact   Pulmonary former smoker,    breath sounds clear to auscultation       Cardiovascular hypertension, Pt. on medications  Rhythm:Regular Rate:Normal   AAA, not yet requiring operation per vascular surgery note     Neuro/Psych  Headaches, negative psych ROS   GI/Hepatic Neg liver ROS, GERD  Controlled and Medicated,  Endo/Other  negative endocrine ROS  Renal/GU negative Renal ROS     Musculoskeletal  (+) Arthritis ,   Abdominal   Peds  Hematology negative hematology ROS (+)   Anesthesia Other Findings   Reproductive/Obstetrics                           Anesthesia Physical Anesthesia Plan  ASA: II  Anesthesia Plan: Spinal   Post-op Pain Management:    Induction:   PONV Risk Score and Plan: 1 and Treatment may vary due to age or medical condition and Propofol infusion  Airway Management Planned: Natural Airway and Simple Face Mask  Additional Equipment: None  Intra-op Plan:   Post-operative Plan:   Informed Consent: I have reviewed the patients History and Physical, chart, labs and discussed the procedure including the risks, benefits and alternatives for the proposed anesthesia with the patient or authorized representative who has indicated his/her understanding and acceptance.       Plan Discussed with: CRNA and Anesthesiologist  Anesthesia Plan Comments: (Labs reviewed, platelets acceptable. Discussed risks and benefits of spinal, including spinal/epidural hematoma, infection, failed block, and  PDPH. Patient expressed understanding and wished to proceed. )      Anesthesia Quick Evaluation

## 2018-09-30 ENCOUNTER — Encounter (HOSPITAL_COMMUNITY)
Admission: RE | Disposition: A | Discharge status: Home Health | Payer: Self-pay | Source: Home / Self Care | Attending: Orthopaedic Surgery

## 2018-09-30 ENCOUNTER — Inpatient Hospital Stay (HOSPITAL_COMMUNITY): Payer: Medicare Other

## 2018-09-30 ENCOUNTER — Inpatient Hospital Stay (HOSPITAL_COMMUNITY)
Admission: RE | Admit: 2018-09-30 | Discharge: 2018-10-04 | DRG: 470 | Disposition: A | Discharge status: Home Health | Payer: Medicare Other | Attending: Orthopaedic Surgery | Admitting: Orthopaedic Surgery

## 2018-09-30 ENCOUNTER — Ambulatory Visit (HOSPITAL_COMMUNITY): Payer: Medicare Other | Admitting: Physician Assistant

## 2018-09-30 ENCOUNTER — Ambulatory Visit (HOSPITAL_COMMUNITY): Payer: Medicare Other

## 2018-09-30 ENCOUNTER — Ambulatory Visit (HOSPITAL_COMMUNITY): Payer: Medicare Other | Admitting: Registered Nurse

## 2018-09-30 ENCOUNTER — Other Ambulatory Visit: Payer: Self-pay

## 2018-09-30 ENCOUNTER — Encounter (HOSPITAL_COMMUNITY): Payer: Self-pay | Admitting: *Deleted

## 2018-09-30 DIAGNOSIS — K219 Gastro-esophageal reflux disease without esophagitis: Secondary | ICD-10-CM | POA: Diagnosis present

## 2018-09-30 DIAGNOSIS — Z96642 Presence of left artificial hip joint: Secondary | ICD-10-CM

## 2018-09-30 DIAGNOSIS — I10 Essential (primary) hypertension: Secondary | ICD-10-CM | POA: Diagnosis present

## 2018-09-30 DIAGNOSIS — Z87891 Personal history of nicotine dependence: Secondary | ICD-10-CM

## 2018-09-30 DIAGNOSIS — Z471 Aftercare following joint replacement surgery: Secondary | ICD-10-CM | POA: Diagnosis not present

## 2018-09-30 DIAGNOSIS — E785 Hyperlipidemia, unspecified: Secondary | ICD-10-CM | POA: Diagnosis present

## 2018-09-30 DIAGNOSIS — M1612 Unilateral primary osteoarthritis, left hip: Principal | ICD-10-CM | POA: Diagnosis present

## 2018-09-30 DIAGNOSIS — M25559 Pain in unspecified hip: Secondary | ICD-10-CM

## 2018-09-30 DIAGNOSIS — M720 Palmar fascial fibromatosis [Dupuytren]: Secondary | ICD-10-CM | POA: Diagnosis present

## 2018-09-30 DIAGNOSIS — Z8249 Family history of ischemic heart disease and other diseases of the circulatory system: Secondary | ICD-10-CM

## 2018-09-30 DIAGNOSIS — M25552 Pain in left hip: Secondary | ICD-10-CM | POA: Diagnosis not present

## 2018-09-30 DIAGNOSIS — G43909 Migraine, unspecified, not intractable, without status migrainosus: Secondary | ICD-10-CM | POA: Diagnosis present

## 2018-09-30 HISTORY — PX: TOTAL HIP ARTHROPLASTY: SHX124

## 2018-09-30 SURGERY — ARTHROPLASTY, HIP, TOTAL, ANTERIOR APPROACH
Anesthesia: Spinal | Site: Hip | Laterality: Left

## 2018-09-30 MED ORDER — LIDOCAINE 2% (20 MG/ML) 5 ML SYRINGE
INTRAMUSCULAR | Status: AC
  Filled 2018-09-30: qty 5

## 2018-09-30 MED ORDER — DOCUSATE SODIUM 100 MG PO CAPS
100.0000 mg | ORAL_CAPSULE | Freq: Two times a day (BID) | ORAL | Status: DC
  Administered 2018-09-30 – 2018-10-04 (×8): 100 mg via ORAL
  Filled 2018-09-30 (×8): qty 1

## 2018-09-30 MED ORDER — ONDANSETRON HCL 4 MG PO TABS: 4.0000 mg | ORAL_TABLET | Freq: Four times a day (QID) | ORAL | Status: DC | PRN

## 2018-09-30 MED ORDER — POLYETHYLENE GLYCOL 3350 17 G PO PACK: 17.0000 g | PACK | Freq: Every day | ORAL | Status: DC | PRN

## 2018-09-30 MED ORDER — ACETAMINOPHEN 325 MG PO TABS
325.0000 mg | ORAL_TABLET | Freq: Four times a day (QID) | ORAL | Status: DC | PRN
  Administered 2018-10-01 – 2018-10-04 (×8): 650 mg via ORAL
  Filled 2018-09-30 (×8): qty 2

## 2018-09-30 MED ORDER — PROPOFOL 10 MG/ML IV BOLUS
INTRAVENOUS | Status: AC
  Filled 2018-09-30: qty 20

## 2018-09-30 MED ORDER — METOCLOPRAMIDE HCL 5 MG PO TABS: 5.0000 mg | ORAL_TABLET | Freq: Three times a day (TID) | ORAL | Status: DC | PRN

## 2018-09-30 MED ORDER — PROPOFOL 10 MG/ML IV BOLUS
INTRAVENOUS | Status: DC | PRN
  Administered 2018-09-30: 20 mg via INTRAVENOUS

## 2018-09-30 MED ORDER — FENTANYL CITRATE (PF) 100 MCG/2ML IJ SOLN: 25.0000 ug | INTRAMUSCULAR | Status: DC | PRN

## 2018-09-30 MED ORDER — SODIUM CHLORIDE 0.9 % IV SOLN
INTRAVENOUS | Status: DC | PRN
  Administered 2018-09-30: 50 ug/min via INTRAVENOUS

## 2018-09-30 MED ORDER — FENTANYL CITRATE (PF) 100 MCG/2ML IJ SOLN
INTRAMUSCULAR | Status: AC
  Filled 2018-09-30: qty 2

## 2018-09-30 MED ORDER — CEFAZOLIN SODIUM-DEXTROSE 2-4 GM/100ML-% IV SOLN
2.0000 g | INTRAVENOUS | Status: AC
  Administered 2018-09-30: 2 g via INTRAVENOUS
  Filled 2018-09-30: qty 100

## 2018-09-30 MED ORDER — SODIUM CHLORIDE 0.9 % IR SOLN
Status: DC | PRN
  Administered 2018-09-30: 1000 mL

## 2018-09-30 MED ORDER — PHENYLEPHRINE 40 MCG/ML (10ML) SYRINGE FOR IV PUSH (FOR BLOOD PRESSURE SUPPORT)
PREFILLED_SYRINGE | INTRAVENOUS | Status: AC
  Filled 2018-09-30: qty 10

## 2018-09-30 MED ORDER — ASPIRIN 81 MG PO CHEW
81.0000 mg | CHEWABLE_TABLET | Freq: Two times a day (BID) | ORAL | Status: DC
  Administered 2018-09-30 – 2018-10-04 (×8): 81 mg via ORAL
  Filled 2018-09-30 (×8): qty 1

## 2018-09-30 MED ORDER — OXYCODONE HCL 5 MG PO TABS
5.0000 mg | ORAL_TABLET | ORAL | Status: DC | PRN
  Administered 2018-10-01 (×3): 10 mg via ORAL
  Administered 2018-10-01: 5 mg via ORAL
  Administered 2018-10-02 (×3): 10 mg via ORAL
  Administered 2018-10-02: 5 mg via ORAL
  Filled 2018-09-30 (×2): qty 2
  Filled 2018-09-30: qty 1
  Filled 2018-09-30 (×2): qty 2
  Filled 2018-09-30: qty 1
  Filled 2018-09-30 (×2): qty 2
  Filled 2018-09-30: qty 1

## 2018-09-30 MED ORDER — CHLORHEXIDINE GLUCONATE 4 % EX LIQD: 60.0000 mL | Freq: Once | CUTANEOUS | Status: DC

## 2018-09-30 MED ORDER — PHENYLEPHRINE 40 MCG/ML (10ML) SYRINGE FOR IV PUSH (FOR BLOOD PRESSURE SUPPORT)
PREFILLED_SYRINGE | INTRAVENOUS | Status: DC | PRN
  Administered 2018-09-30 (×6): 80 ug via INTRAVENOUS
  Administered 2018-09-30: 40 ug via INTRAVENOUS
  Administered 2018-09-30: 80 ug via INTRAVENOUS

## 2018-09-30 MED ORDER — ONDANSETRON HCL 4 MG/2ML IJ SOLN: 4.0000 mg | Freq: Four times a day (QID) | INTRAMUSCULAR | Status: DC | PRN

## 2018-09-30 MED ORDER — CEFAZOLIN SODIUM-DEXTROSE 1-4 GM/50ML-% IV SOLN
1.0000 g | Freq: Four times a day (QID) | INTRAVENOUS | Status: AC
  Administered 2018-09-30 (×2): 1 g via INTRAVENOUS
  Filled 2018-09-30 (×2): qty 50

## 2018-09-30 MED ORDER — ONDANSETRON HCL 4 MG/2ML IJ SOLN
INTRAMUSCULAR | Status: DC | PRN
  Administered 2018-09-30: 4 mg via INTRAVENOUS

## 2018-09-30 MED ORDER — MIDAZOLAM HCL 2 MG/2ML IJ SOLN
INTRAMUSCULAR | Status: AC
  Filled 2018-09-30: qty 2

## 2018-09-30 MED ORDER — MENTHOL 3 MG MT LOZG: 1.0000 | LOZENGE | OROMUCOSAL | Status: DC | PRN

## 2018-09-30 MED ORDER — FENTANYL CITRATE (PF) 100 MCG/2ML IJ SOLN
INTRAMUSCULAR | Status: DC | PRN
  Administered 2018-09-30: 50 ug via INTRAVENOUS

## 2018-09-30 MED ORDER — HYDROMORPHONE HCL 1 MG/ML IJ SOLN
0.5000 mg | INTRAMUSCULAR | Status: DC | PRN
  Administered 2018-09-30 (×2): 1 mg via INTRAVENOUS
  Filled 2018-09-30 (×2): qty 1

## 2018-09-30 MED ORDER — MEPERIDINE HCL 50 MG/ML IJ SOLN
INTRAMUSCULAR | Status: AC
  Filled 2018-09-30: qty 1

## 2018-09-30 MED ORDER — METHOCARBAMOL 500 MG IVPB - SIMPLE MED
500.0000 mg | Freq: Four times a day (QID) | INTRAVENOUS | Status: DC | PRN
  Administered 2018-09-30: 500 mg via INTRAVENOUS
  Filled 2018-09-30: qty 50

## 2018-09-30 MED ORDER — SODIUM CHLORIDE 0.9 % IV SOLN
INTRAVENOUS | Status: DC
  Administered 2018-09-30 – 2018-10-03 (×3): via INTRAVENOUS

## 2018-09-30 MED ORDER — PHENYLEPHRINE HCL-NACL 10-0.9 MG/250ML-% IV SOLN
INTRAVENOUS | Status: AC
  Filled 2018-09-30: qty 250

## 2018-09-30 MED ORDER — LACTATED RINGERS IV SOLN
INTRAVENOUS | Status: DC
  Administered 2018-09-30: 07:00:00 via INTRAVENOUS

## 2018-09-30 MED ORDER — DIPHENHYDRAMINE HCL 12.5 MG/5ML PO ELIX: 12.5000 mg | ORAL_SOLUTION | ORAL | Status: DC | PRN

## 2018-09-30 MED ORDER — METOCLOPRAMIDE HCL 5 MG/ML IJ SOLN: 5.0000 mg | Freq: Three times a day (TID) | INTRAMUSCULAR | Status: DC | PRN

## 2018-09-30 MED ORDER — PHENOL 1.4 % MT LIQD: 1.0000 | OROMUCOSAL | Status: DC | PRN

## 2018-09-30 MED ORDER — GLYCOPYRROLATE 0.2 MG/ML IJ SOLN
INTRAMUSCULAR | Status: DC | PRN
  Administered 2018-09-30: 0.2 mg via INTRAVENOUS

## 2018-09-30 MED ORDER — METHOCARBAMOL 500 MG IVPB - SIMPLE MED
INTRAVENOUS | Status: AC
  Filled 2018-09-30: qty 50

## 2018-09-30 MED ORDER — BUPIVACAINE IN DEXTROSE 0.75-8.25 % IT SOLN
INTRATHECAL | Status: DC | PRN
  Administered 2018-09-30: 1.6 mL via INTRATHECAL

## 2018-09-30 MED ORDER — PROPOFOL 10 MG/ML IV BOLUS
INTRAVENOUS | Status: AC
  Filled 2018-09-30: qty 40

## 2018-09-30 MED ORDER — MIDAZOLAM HCL 2 MG/2ML IJ SOLN
INTRAMUSCULAR | Status: DC | PRN
  Administered 2018-09-30: 2 mg via INTRAVENOUS

## 2018-09-30 MED ORDER — ONDANSETRON HCL 4 MG/2ML IJ SOLN: 4.0000 mg | Freq: Once | INTRAMUSCULAR | Status: DC | PRN

## 2018-09-30 MED ORDER — OXYCODONE HCL 5 MG PO TABS
10.0000 mg | ORAL_TABLET | ORAL | Status: DC | PRN
  Administered 2018-09-30 – 2018-10-01 (×3): 10 mg via ORAL
  Administered 2018-10-01 (×2): 15 mg via ORAL
  Filled 2018-09-30: qty 3
  Filled 2018-09-30: qty 2
  Filled 2018-09-30: qty 3
  Filled 2018-09-30 (×3): qty 2
  Filled 2018-09-30: qty 3

## 2018-09-30 MED ORDER — PANTOPRAZOLE SODIUM 40 MG PO TBEC
40.0000 mg | DELAYED_RELEASE_TABLET | Freq: Every day | ORAL | Status: DC
  Administered 2018-09-30 – 2018-10-04 (×5): 40 mg via ORAL
  Filled 2018-09-30 (×5): qty 1

## 2018-09-30 MED ORDER — ONDANSETRON HCL 4 MG/2ML IJ SOLN
INTRAMUSCULAR | Status: AC
  Filled 2018-09-30: qty 2

## 2018-09-30 MED ORDER — TRANEXAMIC ACID-NACL 1000-0.7 MG/100ML-% IV SOLN
1000.0000 mg | INTRAVENOUS | Status: AC
  Administered 2018-09-30: 1000 mg via INTRAVENOUS
  Filled 2018-09-30: qty 100

## 2018-09-30 MED ORDER — MEPERIDINE HCL 50 MG/ML IJ SOLN
6.2500 mg | INTRAMUSCULAR | Status: DC | PRN
  Administered 2018-09-30: 12.5 mg via INTRAVENOUS

## 2018-09-30 MED ORDER — OXYCODONE HCL 5 MG PO TABS: 5.0000 mg | ORAL_TABLET | Freq: Once | ORAL | Status: DC | PRN

## 2018-09-30 MED ORDER — 0.9 % SODIUM CHLORIDE (POUR BTL) OPTIME
TOPICAL | Status: DC | PRN
  Administered 2018-09-30: 1000 mL

## 2018-09-30 MED ORDER — ALUM & MAG HYDROXIDE-SIMETH 200-200-20 MG/5ML PO SUSP: 30.0000 mL | ORAL | Status: DC | PRN

## 2018-09-30 MED ORDER — OXYCODONE HCL 5 MG/5ML PO SOLN
5.0000 mg | Freq: Once | ORAL | Status: DC | PRN
  Filled 2018-09-30: qty 5

## 2018-09-30 MED ORDER — GLYCOPYRROLATE PF 0.2 MG/ML IJ SOSY
PREFILLED_SYRINGE | INTRAMUSCULAR | Status: AC
  Filled 2018-09-30: qty 1

## 2018-09-30 MED ORDER — METHOCARBAMOL 500 MG PO TABS
500.0000 mg | ORAL_TABLET | Freq: Four times a day (QID) | ORAL | Status: DC | PRN
  Administered 2018-09-30 – 2018-10-03 (×8): 500 mg via ORAL
  Filled 2018-09-30 (×10): qty 1

## 2018-09-30 MED ORDER — ATROPINE SULFATE 0.4 MG/ML IJ SOLN
INTRAMUSCULAR | Status: DC | PRN
  Administered 2018-09-30: .8 mg via INTRAVENOUS

## 2018-09-30 MED ORDER — IRBESARTAN 150 MG PO TABS
300.0000 mg | ORAL_TABLET | Freq: Every day | ORAL | Status: DC
  Administered 2018-10-01 – 2018-10-02 (×2): 300 mg via ORAL
  Filled 2018-09-30 (×2): qty 2

## 2018-09-30 MED ORDER — DEXAMETHASONE SODIUM PHOSPHATE 10 MG/ML IJ SOLN
INTRAMUSCULAR | Status: AC
  Filled 2018-09-30: qty 1

## 2018-09-30 MED ORDER — PROPOFOL 500 MG/50ML IV EMUL
INTRAVENOUS | Status: DC | PRN
  Administered 2018-09-30: 40 ug/kg/min via INTRAVENOUS

## 2018-09-30 MED ORDER — STERILE WATER FOR IRRIGATION IR SOLN
Status: DC | PRN
  Administered 2018-09-30: 2000 mL

## 2018-09-30 MED ORDER — DEXAMETHASONE SODIUM PHOSPHATE 10 MG/ML IJ SOLN
INTRAMUSCULAR | Status: DC | PRN
  Administered 2018-09-30: 8 mg via INTRAVENOUS

## 2018-09-30 SURGICAL SUPPLY — 40 items
APL SKNCLS STERI-STRIP NONHPOA (GAUZE/BANDAGES/DRESSINGS) ×1
ARTICULEZE HEAD (Hips) ×2 IMPLANT
BAG SPEC THK2 15X12 ZIP CLS (MISCELLANEOUS)
BAG ZIPLOCK 12X15 (MISCELLANEOUS) IMPLANT
BENZOIN TINCTURE PRP APPL 2/3 (GAUZE/BANDAGES/DRESSINGS) ×1 IMPLANT
BLADE SAW SGTL 18X1.27X75 (BLADE) ×2 IMPLANT
BLADE SURG SZ10 CARB STEEL (BLADE) ×4 IMPLANT
COVER PERINEAL POST (MISCELLANEOUS) ×2 IMPLANT
COVER SURGICAL LIGHT HANDLE (MISCELLANEOUS) ×2 IMPLANT
COVER WAND RF STERILE (DRAPES) IMPLANT
CUP ACET PNNCL SECTR W/GRIP 56 (Hips) IMPLANT
DRAPE STERI IOBAN 125X83 (DRAPES) ×2 IMPLANT
DRAPE U-SHAPE 47X51 STRL (DRAPES) ×4 IMPLANT
DRSG AQUACEL AG ADV 3.5X10 (GAUZE/BANDAGES/DRESSINGS) ×2 IMPLANT
DURAPREP 26ML APPLICATOR (WOUND CARE) ×2 IMPLANT
ELECT REM PT RETURN 15FT ADLT (MISCELLANEOUS) ×2 IMPLANT
GAUZE XEROFORM 1X8 LF (GAUZE/BANDAGES/DRESSINGS) IMPLANT
GLOVE BIO SURGEON STRL SZ7.5 (GLOVE) ×2 IMPLANT
GLOVE BIOGEL PI IND STRL 8 (GLOVE) ×2 IMPLANT
GLOVE BIOGEL PI INDICATOR 8 (GLOVE) ×2
GLOVE ECLIPSE 8.0 STRL XLNG CF (GLOVE) ×2 IMPLANT
GOWN STRL REUS W/TWL XL LVL3 (GOWN DISPOSABLE) ×4 IMPLANT
HANDPIECE INTERPULSE COAX TIP (DISPOSABLE) ×2
HEAD ARTICULEZE (Hips) IMPLANT
HOLDER FOLEY CATH W/STRAP (MISCELLANEOUS) ×2 IMPLANT
LINER NEUTRAL 52MMX36MMX56N (Liner) ×1 IMPLANT
PACK ANTERIOR HIP CUSTOM (KITS) ×2 IMPLANT
PINN SECTOR W/GRIP ACE CUP 56 (Hips) ×2 IMPLANT
SET HNDPC FAN SPRY TIP SCT (DISPOSABLE) ×1 IMPLANT
STAPLER VISISTAT 35W (STAPLE) ×1 IMPLANT
STEM CORAIL KLA14 (Stem) ×1 IMPLANT
STRIP CLOSURE SKIN 1/2X4 (GAUZE/BANDAGES/DRESSINGS) ×1 IMPLANT
SUT ETHIBOND NAB CT1 #1 30IN (SUTURE) ×2 IMPLANT
SUT MNCRL AB 4-0 PS2 18 (SUTURE) IMPLANT
SUT VIC AB 0 CT1 36 (SUTURE) ×2 IMPLANT
SUT VIC AB 1 CT1 36 (SUTURE) ×2 IMPLANT
SUT VIC AB 2-0 CT1 27 (SUTURE) ×4
SUT VIC AB 2-0 CT1 TAPERPNT 27 (SUTURE) ×2 IMPLANT
TRAY FOLEY MTR SLVR 16FR STAT (SET/KITS/TRAYS/PACK) ×2 IMPLANT
YANKAUER SUCT BULB TIP 10FT TU (MISCELLANEOUS) ×2 IMPLANT

## 2018-09-30 NOTE — Care Plan (Signed)
Ortho Bundle Case Management Note  Patient Details  Name: Keith Hernandez MRN: [id number] Date of Birth: 1950-12-08  Spoke with patient and wife prior to surgery. He has all needed equipment at home. Questions answered.                    DME Arranged:    DME Agency:     HH Arranged:  PT HH Agency:  [facility]  Additional Comments: Please contact me with any questions of if this plan should need to change.  Ladell Heads,  [place] [facility]  [phone] 09/30/2018, 11:22 AM

## 2018-09-30 NOTE — Transfer of Care (Signed)
Immediate Anesthesia Transfer of Care Note  Patient: Keith Hernandez  Procedure(s) Performed: LEFT TOTAL HIP ARTHROPLASTY ANTERIOR APPROACH (Left Hip)  Patient Location: PACU  Anesthesia Type:Spinal  Level of Consciousness: awake, alert  and oriented  Airway & Oxygen Therapy: Patient Spontanous Breathing and Patient connected to face mask oxygen  Post-op Assessment: Report given to RN, Post -op Vital signs reviewed and stable and Patient moving all extremities  Post vital signs: Reviewed and stable  Last Vitals:  Vitals Value Taken Time  BP 116/80 09/30/2018 10:24 AM  Temp    Pulse 67 09/30/2018 10:27 AM  Resp 9 09/30/2018 10:27 AM  SpO2 100 % 09/30/2018 10:27 AM  Vitals shown include unvalidated device data.  Last Pain:  Vitals:   09/30/18 0639  TempSrc:   PainSc: 0-No pain      Patients Stated Pain Goal: 4 (20/72/18 2883)  Complications: No apparent anesthesia complications

## 2018-09-30 NOTE — Anesthesia Postprocedure Evaluation (Signed)
Anesthesia Post Note  Patient: Keith Hernandez  Procedure(s) Performed: LEFT TOTAL HIP ARTHROPLASTY ANTERIOR APPROACH (Left Hip)     Patient location during evaluation: PACU Anesthesia Type: Spinal Level of consciousness: awake and alert Pain management: pain level controlled Vital Signs Assessment: post-procedure vital signs reviewed and stable Respiratory status: spontaneous breathing and respiratory function stable Cardiovascular status: blood pressure returned to baseline and stable Postop Assessment: spinal receding and no apparent nausea or vomiting Anesthetic complications: no    Last Vitals:  Vitals:   09/30/18 1145 09/30/18 1200  BP: 131/87 126/88  Pulse: 70 72  Resp: 13 15  Temp: (!) 36.4 C 36.4 C  SpO2: 100% 100%    Last Pain:  Vitals:   09/30/18 1130  TempSrc:   PainSc: 2                  Audry Pili

## 2018-09-30 NOTE — H&P (Signed)
TOTAL HIP ADMISSION H&P  Patient is admitted for left total hip arthroplasty.  Subjective:  Chief Complaint: left hip pain  HPI: Keith Hernandez, 68 y.o. male, has a history of pain and functional disability in the left hip(s) due to arthritis and patient has failed non-surgical conservative treatments for greater than 12 weeks to include NSAID's and/or analgesics, corticosteriod injections, flexibility and strengthening excercises, weight reduction as appropriate and activity modification.  Onset of symptoms was gradual starting 2 years ago with gradually worsening course since that time.The patient noted no past surgery on the left hip(s).  Patient currently rates pain in the left hip at 10 out of 10 with activity. Patient has night pain, worsening of pain with activity and weight bearing, trendelenberg gait, pain that interfers with activities of daily living and pain with passive range of motion. Patient has evidence of subchondral cysts, subchondral sclerosis, periarticular osteophytes and joint space narrowing by imaging studies. This condition presents safety issues increasing the risk of falls.  There is no current active infection.  Patient Active Problem List   Diagnosis Date Noted  . Unilateral primary osteoarthritis, left hip 07/27/2018   Past Medical History:  Diagnosis Date  . AAA (abdominal aortic aneurysm) ([place])   . Arthritis   . Bulging of cervical intervertebral disc    C6-7  . Dupuytren's disease   . GERD (gastroesophageal reflux disease)    OTC meds pnr  . Hyperlipidemia   . Hypertension   . Migraines     Past Surgical History:  Procedure Laterality Date  . achilles reattachment    . FASCIOTOMY Left 10/11/2015   Procedure: LEFT HAND SUBTOTAL PALMAR FASCIOTOMY WITH REPAIR RECONSTRUCTION ;  Surgeon: Roseanne Kaufman, MD;  Location: [place];  Service: Orthopedics;  Laterality: Left;  . LESION DESTRUCTION Bilateral 07/29/2017   Procedure: EXCISION LIP  LESION;  Surgeon: [name], DO;  Location: [place];  Service: Plastics;  Laterality: Bilateral;  . LIPOMA EXCISION Right 07/29/2017   Procedure: EXCISION BACK LIPOMA;  Surgeon: [name], DO;  Location: [place];  Service: Plastics;  Laterality: Right;    Current Facility-Administered Medications  Medication Dose Route Frequency Provider Last Rate Last Dose  . ceFAZolin (ANCEF) IVPB 2g/100 mL premix  2 g Intravenous On Call to OR Pete Pelt, PA-C      . chlorhexidine (HIBICLENS) 4 % liquid 4 application  60 mL Topical Once Pete Pelt, PA-C      . lactated ringers infusion   Intravenous Continuous Audry Pili, MD 50 mL/hr at 09/30/18 0645    . tranexamic acid (CYKLOKAPRON) IVPB 1,000 mg  1,000 mg Intravenous To OR Pete Pelt, PA-C       No Known Allergies  Social History   Tobacco Use  . Smoking status: Former Smoker    Packs/day: 1.00    Types: Cigarettes    Last attempt to quit: 07/15/1999    Years since quitting: 19.2  . Smokeless tobacco: Never Used  Substance Use Topics  . Alcohol use: Yes    Alcohol/week: 7.0 standard drinks    Types: 5 Glasses of wine, 2 Shots of liquor per week    Comment: social    Family History  Problem Relation Age of Onset  . Arthritis Mother   . Heart disease Mother   . Heart attack Mother   . Asthma Father   . Asthma Paternal Grandfather      Review  of Systems  Musculoskeletal: Positive for joint pain.  All other systems reviewed and are negative.   Objective:  Physical Exam  Constitutional: He is oriented to person, place, and time. He appears well-developed and well-nourished.  HENT:  Head: Normocephalic and atraumatic.  Eyes: Pupils are equal, round, and reactive to light. EOM are normal.  Neck: Normal range of motion. Neck supple.  Cardiovascular: Normal rate and regular rhythm.  Respiratory: Breath sounds normal.  GI: Soft. Bowel sounds are normal.   Musculoskeletal:     Left hip: He exhibits decreased range of motion, decreased strength, tenderness and bony tenderness.  Neurological: He is alert and oriented to person, place, and time.  Skin: Skin is warm and dry.  Psychiatric: He has a normal mood and affect.    Vital signs in last 24 hours: Temp:  [98.2 F (36.8 C)] 98.2 F (36.8 C) (01/17 1561) Pulse Rate:  [68] 68 (01/17 0633) Resp:  [16] 16 (01/17 0633) BP: (165)/(90) 165/90 (01/17 0633) SpO2:  [98 %] 98 % (01/17 5379) Weight:  [95 kg] 95 kg (01/17 0639)  Labs:   Estimated body mass index is 28.79 kg/m as calculated from the following:   Height as of this encounter: 5' 11.5" (1.816 m).   Weight as of this encounter: 95 kg.   Imaging Review Plain radiographs demonstrate severe degenerative joint disease of the left hip(s). The bone quality appears to be excellent for age and reported activity level.    Preoperative templating of the joint replacement has been completed, documented, and submitted to the Operating Room personnel in order to optimize intra-operative equipment management.     Assessment/Plan:  End stage arthritis, left hip(s)  The patient history, physical examination, clinical judgement of the provider and imaging studies are consistent with end stage degenerative joint disease of the left hip(s) and total hip arthroplasty is deemed medically necessary. The treatment options including medical management, injection therapy, arthroscopy and arthroplasty were discussed at length. The risks and benefits of total hip arthroplasty were presented and reviewed. The risks due to aseptic loosening, infection, stiffness, dislocation/subluxation,  thromboembolic complications and other imponderables were discussed.  The patient acknowledged the explanation, agreed to proceed with the plan and consent was signed. Patient is being admitted for inpatient treatment for surgery, pain control, PT, OT, prophylactic  antibiotics, VTE prophylaxis, progressive ambulation and ADL's and discharge planning.The patient is planning to be discharged home with home health services

## 2018-09-30 NOTE — Anesthesia Procedure Notes (Addendum)
Procedure Name: MAC Date/Time: 09/30/2018 8:32 AM Performed by: Eben Burow, CRNA Pre-anesthesia Checklist: Patient identified, Emergency Drugs available, Patient being monitored, Timeout performed and Suction available Oxygen Delivery Method: Simple face mask Dental Injury: Teeth and Oropharynx as per pre-operative assessment

## 2018-09-30 NOTE — Anesthesia Procedure Notes (Signed)
Spinal  Patient location during procedure: OR Start time: 09/30/2018 8:35 AM End time: 09/30/2018 8:38 AM Staffing Anesthesiologist: Audry Pili, MD Performed: anesthesiologist  Preanesthetic Checklist Completed: patient identified, surgical consent, pre-op evaluation, timeout performed, IV checked, risks and benefits discussed and monitors and equipment checked Spinal Block Patient position: sitting Prep: DuraPrep Patient monitoring: heart rate, cardiac monitor, continuous pulse ox and blood pressure Approach: midline Location: L3-4 Injection technique: single-shot Needle Needle type: Pencan  Needle gauge: 24 G Additional Notes Consent was obtained prior to the procedure with all questions answered and concerns addressed. Risks including, but not limited to, bleeding, infection, nerve damage, paralysis, failed block, inadequate analgesia, allergic reaction, high spinal, itching, and headache were discussed and the patient wished to proceed. Functioning IV was confirmed and monitors were applied. Sterile prep and drape, including hand hygiene, mask, and sterile gloves were used. The patient was positioned and the spine was prepped. The skin was anesthetized with lidocaine. Free flow of clear CSF was obtained prior to injecting local anesthetic into the CSF. The spinal needle aspirated freely following injection. The needle was carefully withdrawn. The patient tolerated the procedure well.   Renold Don, MD

## 2018-09-30 NOTE — Brief Op Note (Signed)
09/30/2018  9:57 AM  PATIENT:  Keith Hernandez  68 y.o. male  PRE-OPERATIVE DIAGNOSIS:  Left hip osteoarthritis  POST-OPERATIVE DIAGNOSIS:  Left hip osteoarthritis  PROCEDURE:  Procedure(s): LEFT TOTAL HIP ARTHROPLASTY ANTERIOR APPROACH (Left)  SURGEON:  Surgeon(s) and Role:    Mcarthur Rossetti, MD - Primary  PHYSICIAN ASSISTANT: Benita Stabile, PA-C   ANESTHESIA:   spinal  EBL:  300 cc  COUNTS:  YES  TOURNIQUET:  * No tourniquets in log *  DICTATION: .Other Dictation: Dictation Number [id number]  PLAN OF CARE: Admit to inpatient   PATIENT DISPOSITION:  PACU - hemodynamically stable.   Delay start of Pharmacological VTE agent (>24hrs) due to surgical blood loss or risk of bleeding: no

## 2018-09-30 NOTE — Evaluation (Signed)
Physical Therapy Evaluation Patient Details Name: Keith Hernandez MRN: [id number] DOB: 02/23/1951 Today's Date: 09/30/2018   History of Present Illness  Pt s/p L THR and with hx of AAA  Clinical Impression  Pt s/p L THR and presents with decreased L LE strength/ROM and post op pain limiting functional mobility.  Pt should progress to dc home with family assist.      Follow Up Recommendations Follow surgeon's recommendation for DC plan and follow-up therapies    Equipment Recommendations  None recommended by PT    Recommendations for Other Services OT consult     Precautions / Restrictions Precautions Precautions: Fall Restrictions Weight Bearing Restrictions: No Other Position/Activity Restrictions: WBAT      Mobility  Bed Mobility Overal bed mobility: Needs Assistance Bed Mobility: Supine to Sit     Supine to sit: Min assist     General bed mobility comments: cues for sequence and use of R LE to self assist  Transfers Overall transfer level: Needs assistance   Transfers: Sit to/from Stand Sit to Stand: Min assist         General transfer comment: cues for LE management and use of UEs to self assist  Ambulation/Gait Ambulation/Gait assistance: Min assist Gait Distance (Feet): 26 Feet Assistive device: Rolling walker (2 wheeled) Gait Pattern/deviations: Step-to pattern;Decreased step length - right;Decreased step length - left;Shuffle;Trunk flexed Gait velocity: decr   General Gait Details: cues for posture, position from RW and sequence  Stairs            Wheelchair Mobility    Modified Rankin (Stroke Patients Only)       Balance Overall balance assessment: Mild deficits observed, not formally tested                                           Pertinent Vitals/Pain Pain Assessment: 0-10 Pain Score: 7  Pain Location: L hip Pain Descriptors / Indicators: Aching;Sore Pain Intervention(s): Limited activity within patient's  tolerance;Monitored during session;Premedicated before session;Ice applied    Home Living Family/patient expects to be discharged to:: Private residence Living Arrangements: Spouse/significant other Available Help at Discharge: Family Type of Home: House Home Access: Stairs to enter Entrance Stairs-Rails: Psychiatric nurse of Steps: 4 Home Layout: Able to live on main level with bedroom/bathroom;Two level Home Equipment: Cane - single point;Crutches;Walker - 2 wheels      Prior Function Level of Independence: Independent               Hand Dominance        Extremity/Trunk Assessment   Upper Extremity Assessment Upper Extremity Assessment: Overall WFL for tasks assessed    Lower Extremity Assessment Lower Extremity Assessment: LLE deficits/detail    Cervical / Trunk Assessment Cervical / Trunk Assessment: Normal  Communication   Communication: No difficulties  Cognition Arousal/Alertness: Awake/alert Behavior During Therapy: WFL for tasks assessed/performed Overall Cognitive Status: Within Functional Limits for tasks assessed                                        General Comments      Exercises Total Joint Exercises Ankle Circles/Pumps: AROM;Both;15 reps;Supine   Assessment/Plan    PT Assessment Patient needs continued PT services  PT Problem List Decreased strength;Decreased range of motion;Decreased activity tolerance;Decreased  mobility;Decreased knowledge of use of DME;Pain       PT Treatment Interventions DME instruction;Stair training;Gait training;Functional mobility training;Therapeutic activities;Therapeutic exercise;Patient/family education    PT Goals (Current goals can be found in the Care Plan section)  Acute Rehab PT Goals Patient Stated Goal: Regain IND  PT Goal Formulation: With patient Time For Goal Achievement: 10/07/18 Potential to Achieve Goals: Good    Frequency 7X/week   Barriers to discharge         Co-evaluation               AM-PAC PT "6 Clicks" Mobility  Outcome Measure Help needed turning from your back to your side while in a flat bed without using bedrails?: A Little Help needed moving from lying on your back to sitting on the side of a flat bed without using bedrails?: A Little Help needed moving to and from a bed to a chair (including a wheelchair)?: A Little Help needed standing up from a chair using your arms (e.g., wheelchair or bedside chair)?: A Little Help needed to walk in hospital room?: A Little Help needed climbing 3-5 steps with a railing? : A Little 6 Click Score: 18    End of Session Equipment Utilized During Treatment: Gait belt Activity Tolerance: Patient tolerated treatment well;Patient limited by pain Patient left: in chair;with call bell/phone within reach;with family/visitor present Nurse Communication: Mobility status PT Visit Diagnosis: Difficulty in walking, not elsewhere classified (R26.2)    Time: 1601-0932 PT Time Calculation (min) (ACUTE ONLY): 29 min   Charges:   PT Evaluation $PT Eval Low Complexity: 1 Low PT Treatments $Gait Training: 8-22 mins        [place] Pager [phone] Office [phone]   Mason Dibiasio 09/30/2018, 5:52 PM

## 2018-09-30 NOTE — Op Note (Signed)
NAME: Keith Hernandez, Keith Hernandez. MEDICAL RECORD [id number] ACCOUNT [id number] DATE OF BIRTH:13-May-1951 FACILITY: [facility] LOCATION: [facility] PHYSICIAN:Steven Veazie Kerry Fort, MD  OPERATIVE REPORT  DATE OF PROCEDURE:  09/30/2018  PREOPERATIVE DIAGNOSIS:  Primary osteoarthritis and degenerative joint disease, left hip.  POSTOPERATIVE DIAGNOSIS:  Primary osteoarthritis and degenerative joint disease, left hip.  PROCEDURE:  Left total hip arthroplasty through direct anterior approach.  IMPLANTS:  DePuy Sector Gription acetabular component size 56, size 36+0 neutral polyethylene liner, size 14 Corail femoral component with varus offset, size 32+5 metal hip ball.  SURGEON:  Lind Guest. Ninfa Linden, MD  ASSISTANT:  Erskine Emery, PA-C.  ANESTHESIA:  Spinal.  ANTIBIOTICS:  Two grams IV Ancef.  ESTIMATED BLOOD LOSS:  300 mL.  COMPLICATIONS:  None.  INDICATION:  The patient is a 68 year old gentleman with several-year history of worsening left hip pain and known osteoarthritis of the left hip.  He has tried and failed all forms of conservative treatment.  At this point, his hip is significantly  stiff, and his pain is daily.  It has detrimentally affected his mobility, his quality of life, and his activities of daily living.  At this point, he does wish to proceed with total hip arthroplasty through direct anterior approach.  We had a long and  thorough discussion about the risk of acute blood loss anemia, nerve or vessel injury, fracture, infection, dislocation, DVT.  He understands our goals are to decrease pain, improve mobility and overall improve quality of life.  DESCRIPTION OF PROCEDURE:  After informed consent was obtained and appropriate left hip was marked, he was brought to the operating room and sat up on a stretcher where spinal anesthesia was obtained.  He was then laid in the supine position.  A Foley  catheter was placed.  I did assess his leg lengths and then put traction boots on both  his legs.  He was then placed supine on the Hana fracture table, the perineal post in place, and both legs in in-line skeletal traction and no traction applied.  I  then preoperatively assessed him again radiographically for leg length and offset.  We then prepped his left hip with DuraPrep and sterile drapes.  A time-out was called, and he was identified as correct patient, correct left hip.  We then made an  incision just inferior and posterior to the anterior superior iliac spine and carried this obliquely down the leg.  We dissected down tensor fascia lata muscle.  Tensor fascia was then divided longitudinally to proceed with direct anterior approach to  the hip.  We identified and cauterized circumflex vessels.  I then identified the hip capsule, opened the hip capsule in an L-type format, finding moderate joint effusion and significant periarticular arthritis and osteophytes around the left hip.  We  then placed a Cobra retractor around the medial and lateral femoral neck and made our femoral neck cut just proximal to the lesser trochanter with an oscillating saw and completed this with an osteotome.  I placed a corkscrew guide in the femoral head  and removed the femoral head in its entirety and found a wide area devoid of cartilage.  I then placed a bent Hohmann over the medial acetabular rim and began reaming from a size 44 reamer in stepwise increments up to a size 55 with all reamers under  direct visualization, the last reamer under direct fluoroscopy, so we could obtain our depth in reaming, our inclination and anteversion.  I then placed the real DePuy Sector Gription acetabular  component size 56 and a 36+0 neutral polyethylene liner for  that size 56 acetabular component.  Attention was then turned to the femur.  With the leg externally rotated to 120 degrees, extended and adducted, we placed a Mueller retractor medially and a Hohmann retractor behind the greater trochanter, released  lateral  joint capsule and used a box-cutting osteotome to enter the femoral canal and a rongeur to lateralize.  We then began broaching from using the Corail broaching system from a size 8 going up to a size 14.  With the 14 in place, we trialed a  standard offset femoral neck and a 36+1.5 hip ball, reduced this in the acetabulum, and I felt like based on his preoperative films and intraoperative films, we needed to go actually a little bit more offset and length.  We dislocated the hip and removed  the trial components.  We placed the real Corail femoral component with varus offset size 14, and we went with a 36+5 metal hip ball, reduced this in the acetabulum.  It was tight, but it really felt like we replicated his preoperative and  intraoperative films in terms of leg length and offset.  I was pleased with stability as well.  We then irrigated the soft tissue with normal saline solution using pulsatile lavage.  We closed the joint capsule with interrupted #1 Ethibond suture,  followed by running 0 Vicryl and tensor fascia, 0 Vicryl in the deep tissue, 2-0 Vicryl subcutaneous tissue, 4-0 Monocryl subcuticular stitch and Steri-Strips on the skin.  An Aquacel dressing was applied.  He was taken off the Hana table and taken to  recovery room in stable condition.  All final counts were correct.  There were no complications noted.  Note Benita Stabile, PA-C, assisted the entire case.  His assistance was crucial for facilitating all aspects of this case.  LN/NUANCE  D:09/30/2018 T:09/30/2018 JOB:[id number]

## 2018-10-01 LAB — CBC
HCT: 37.9 % — ABNORMAL LOW (ref 39.0–52.0)
Hemoglobin: 12.2 g/dL — ABNORMAL LOW (ref 13.0–17.0)
MCH: 28.6 pg (ref 26.0–34.0)
MCHC: 32.2 g/dL (ref 30.0–36.0)
MCV: 89 fL (ref 80.0–100.0)
Platelets: 168 K/uL (ref 150–400)
RBC: 4.26 MIL/uL (ref 4.22–5.81)
RDW: 13 % (ref 11.5–15.5)
WBC: 12.5 K/uL — ABNORMAL HIGH (ref 4.0–10.5)
nRBC: 0 % (ref 0.0–0.2)

## 2018-10-01 LAB — BASIC METABOLIC PANEL WITH GFR
Anion gap: 10 (ref 5–15)
BUN: 13 mg/dL (ref 8–23)
CO2: 24 mmol/L (ref 22–32)
Calcium: 8.5 mg/dL — ABNORMAL LOW (ref 8.9–10.3)
Chloride: 100 mmol/L (ref 98–111)
Creatinine, Ser: 0.93 mg/dL (ref 0.61–1.24)
GFR calc Af Amer: >60 mL/min (ref >60)
GFR calc non Af Amer: >60 mL/min (ref >60)
Glucose, Bld: 132 mg/dL — ABNORMAL HIGH (ref 70–99)
Potassium: 4.1 mmol/L (ref 3.5–5.1)
Sodium: 134 mmol/L — ABNORMAL LOW (ref 135–145)

## 2018-10-01 NOTE — Progress Notes (Signed)
Subjective: 1 Day Post-Op Procedure(s) (LRB): LEFT TOTAL HIP ARTHROPLASTY ANTERIOR APPROACH (Left) Patient reports pain as moderate.    Objective: Vital signs in last 24 hours: Temp:  [97.5 F (36.4 C)-98.5 F (36.9 C)] 98.5 F (36.9 C) (01/18 0527) Pulse Rate:  [62-99] 99 (01/18 0527) Resp:  [9-19] 17 (01/18 0527) BP: (102-149)/(76-95) 102/84 (01/18 0527) SpO2:  [96 %-100 %] 100 % (01/18 0527)  Intake/Output from previous day: 01/17 0701 - 01/18 0700 In: 4281.9 [P.O.:1080; I.V.:2951.9; IV Piggyback:250] Out: 6803 [Urine:3975; Blood:300] Intake/Output this shift: No intake/output data recorded.  Recent Labs    10/01/18 0317  HGB 12.2*   Recent Labs    10/01/18 0317  WBC 12.5*  RBC 4.26  HCT 37.9*  PLT 168   Recent Labs    10/01/18 0317  NA 134*  K 4.1  CL 100  CO2 24  BUN 13  CREATININE 0.93  GLUCOSE 132*  CALCIUM 8.5*   No results for input(s): LABPT, INR in the last 72 hours.  Sensation intact distally Intact pulses distally Dorsiflexion/Plantar flexion intact Incision: dressing C/D/I  Assessment/Plan: 1 Day Post-Op Procedure(s) (LRB): LEFT TOTAL HIP ARTHROPLASTY ANTERIOR APPROACH (Left) Up with therapy Plan for discharge tomorrow Discharge home with home health    Mcarthur Rossetti 10/01/2018, 7:54 AM

## 2018-10-01 NOTE — Progress Notes (Signed)
Physical Therapy Treatment Patient Details Name: Keith Hernandez MRN: [id number] DOB: Sep 27, 1950 Today's Date: 10/01/2018    History of Present Illness Pt s/p L THR and with hx of AAA    PT Comments    Pt continues to require increased time for all tasks but with marked improvement in activity tolerance noted vs am session.   Follow Up Recommendations  Follow surgeon's recommendation for DC plan and follow-up therapies     Equipment Recommendations  None recommended by PT    Recommendations for Other Services OT consult     Precautions / Restrictions Precautions Precautions: Fall Restrictions Weight Bearing Restrictions: No Other Position/Activity Restrictions: WBAT    Mobility  Bed Mobility Overal bed mobility: Needs Assistance Bed Mobility: Supine to Sit;Sit to Supine     Supine to sit: Min assist Sit to supine: Min assist   General bed mobility comments: cues for sequence and use of R LE to self assist  Transfers Overall transfer level: Needs assistance Equipment used: Rolling walker (2 wheeled) Transfers: Sit to/from Stand Sit to Stand: Min assist         General transfer comment: cues for UE/LE placement  Ambulation/Gait Ambulation/Gait assistance: Min assist;Min guard Gait Distance (Feet): 80 Feet Assistive device: Rolling walker (2 wheeled) Gait Pattern/deviations: Step-to pattern;Decreased step length - right;Decreased step length - left;Shuffle;Trunk flexed Gait velocity: decr   General Gait Details: cues for posture, position from RW and sequence; distance ltd by c/o dizziness   Stairs             Wheelchair Mobility    Modified Rankin (Stroke Patients Only)       Balance Overall balance assessment: Mild deficits observed, not formally tested                                          Cognition Arousal/Alertness: Awake/alert Behavior During Therapy: WFL for tasks assessed/performed Overall Cognitive Status:  Within Functional Limits for tasks assessed                                        Exercises Total Joint Exercises Ankle Circles/Pumps: AROM;Both;15 reps;Supine Quad Sets: AROM;Both;10 reps;Supine Heel Slides: AAROM;Left;20 reps;Supine Hip ABduction/ADduction: AAROM;Left;15 reps;Supine    General Comments        Pertinent Vitals/Pain Pain Assessment: 0-10 Pain Score: 6  Pain Location: L hip Pain Descriptors / Indicators: Aching;Sore;Spasm Pain Intervention(s): Limited activity within patient's tolerance;Monitored during session;Premedicated before session;Ice applied    Home Living                      Prior Function            PT Goals (current goals can now be found in the care plan section) Acute Rehab PT Goals Patient Stated Goal: Regain IND  PT Goal Formulation: With patient Time For Goal Achievement: 10/07/18 Potential to Achieve Goals: Good Progress towards PT goals: Progressing toward goals    Frequency    7X/week      PT Plan Current plan remains appropriate    Co-evaluation              AM-PAC PT "6 Clicks" Mobility   Outcome Measure  Help needed turning from your back to your side while in a flat bed  without using bedrails?: A Little Help needed moving from lying on your back to sitting on the side of a flat bed without using bedrails?: A Little Help needed moving to and from a bed to a chair (including a wheelchair)?: A Little Help needed standing up from a chair using your arms (e.g., wheelchair or bedside chair)?: A Little Help needed to walk in hospital room?: A Little Help needed climbing 3-5 steps with a railing? : A Little 6 Click Score: 18    End of Session Equipment Utilized During Treatment: Gait belt Activity Tolerance: Patient tolerated treatment well Patient left: in bed;with call bell/phone within reach;with nursing/sitter in room Nurse Communication: Mobility status PT Visit Diagnosis: Difficulty  in walking, not elsewhere classified (R26.2)     Time: 2440-1027 PT Time Calculation (min) (ACUTE ONLY): 44 min  Charges:  $Gait Training: 23-37 mins $Therapeutic Exercise: 8-22 mins                     [place] Pager [phone] Office [phone]    Seirra Kos 10/01/2018, 4:44 PM

## 2018-10-01 NOTE — Progress Notes (Signed)
Pt stable at time of bedside report with Barnetta Chapel RN. No needs at time of report. Though urinary retention is still a concern. No changes to note.

## 2018-10-01 NOTE — Evaluation (Signed)
Occupational Therapy Evaluation Patient Details Name: Keith Hernandez MRN: [id number] DOB: 03/25/1951 Today's Date: 10/01/2018    History of Present Illness Pt s/p L THR and with hx of AAA   Clinical Impression   Pt admitted for the above sx. At baseline, he is independent. His wife is 99% recovered from fx patella and can assist as needed. He has a 3:1, which he borrowed. Will likely stay on main level and sponge bathe initially.  Pt limited by pain. Will follow in acute setting    Follow Up Recommendations  Supervision/Assistance - 24 hour    Equipment Recommendations  None recommended by OT    Recommendations for Other Services       Precautions / Restrictions Precautions Precautions: Fall Restrictions Other Position/Activity Restrictions: WBAT      Mobility Bed Mobility         Supine to sit: Min assist     General bed mobility comments: for LLE  Transfers Overall transfer level: Needs assistance     Sit to Stand: Min assist;From elevated surface         General transfer comment: cues for UE/LE placement    Balance                                           ADL either performed or assessed with clinical judgement   ADL Overall ADL's : Needs assistance/impaired                         Toilet Transfer: Minimal assistance;Stand-pivot;RW(chair)             General ADL Comments: pt limited by pain for adls:  set up for UB; max A for LB.  Limited partcipation in ADLs due to pain this am.   Pt has a reacher:  educated on use for ADLs vs having wife assist     Vision         Perception     Praxis      Pertinent Vitals/Pain Pain Score: 8  Pain Location: L hip Pain Descriptors / Indicators: Aching;Sore Pain Intervention(s): Limited activity within patient's tolerance;Monitored during session;Premedicated before session;Repositioned;Patient requesting pain meds-RN notified;Ice applied     Hand Dominance      Extremity/Trunk Assessment Upper Extremity Assessment Upper Extremity Assessment: Overall WFL for tasks assessed           Communication Communication Communication: No difficulties   Cognition Arousal/Alertness: Awake/alert Behavior During Therapy: WFL for tasks assessed/performed Overall Cognitive Status: Within Functional Limits for tasks assessed                                     General Comments  extra time for all activities due to pain    Exercises     Shoulder Instructions      Home Living Family/patient expects to be discharged to:: Private residence Living Arrangements: Spouse/significant other Available Help at Discharge: Family                   Bathroom Toilet: Standard     Home Equipment: (borrowed 3:1)   Additional Comments: walk in shower down one level from main level      Prior Functioning/Environment Level of Independence: Independent  OT Problem List: Pain;Decreased knowledge of use of DME or AE;Decreased activity tolerance      OT Treatment/Interventions: Self-care/ADL training;DME and/or AE instruction;Patient/family education    OT Goals(Current goals can be found in the care plan section) Acute Rehab OT Goals Patient Stated Goal: Regain IND  OT Goal Formulation: With patient Time For Goal Achievement: 10/08/18 Potential to Achieve Goals: Good ADL Goals Pt Will Transfer to Toilet: with min guard assist;bedside commode;ambulating Additional ADL Goal #1: pt will verbalize sequence to step into shower  OT Frequency: Min 2X/week   Barriers to D/C:            Co-evaluation              AM-PAC OT "6 Clicks" Daily Activity     Outcome Measure Help from another person eating meals?: None Help from another person taking care of personal grooming?: A Little Help from another person toileting, which includes using toliet, bedpan, or urinal?: A Little Help from another person bathing  (including washing, rinsing, drying)?: A Lot Help from another person to put on and taking off regular upper body clothing?: A Little Help from another person to put on and taking off regular lower body clothing?: A Lot 6 Click Score: 17   End of Session    Activity Tolerance: Patient limited by pain Patient left: in chair;with call bell/phone within reach;with chair alarm set  OT Visit Diagnosis: Pain Pain - Right/Left: Left Pain - part of body: Hip                Time: 2297-9892 OT Time Calculation (min): 27 min Charges:  OT General Charges $OT Visit: 1 Visit OT Evaluation $OT Eval Low Complexity: 1 Low OT Treatments $Self Care/Home Management : 8-22 mins  Lesle Chris, OTR/L Acute Rehabilitation Services [phone] [facility] pager [phone] office 10/01/2018  [place] 10/01/2018, 10:06 AM

## 2018-10-01 NOTE — Progress Notes (Signed)
Patient complaining of feeling faint, light headed . VS taken 96/62 HR 69 Respiration 14 oxygen level 97% on room air. Notified Dr. Ninfa Linden via after hours pager [phone]

## 2018-10-01 NOTE — Progress Notes (Signed)
Physical Therapy Treatment Patient Details Name: Keith Hernandez MRN: [id number] DOB: 1951-02-25 Today's Date: 10/01/2018    History of Present Illness Pt s/p L THR and with hx of AAA    PT Comments    Pt cooperative but ltd this am by c/o L thigh muscle spasms and c/o dizziness with OOB activity.   Follow Up Recommendations  Follow surgeon's recommendation for DC plan and follow-up therapies     Equipment Recommendations  None recommended by PT    Recommendations for Other Services OT consult     Precautions / Restrictions Precautions Precautions: Fall Restrictions Weight Bearing Restrictions: No Other Position/Activity Restrictions: WBAT    Mobility  Bed Mobility Overal bed mobility: Needs Assistance Bed Mobility: Sit to Supine     Supine to sit: Min assist Sit to supine: Min assist   General bed mobility comments: cues for sequence and use of R LE to self assist  Transfers Overall transfer level: Needs assistance Equipment used: Rolling walker (2 wheeled) Transfers: Sit to/from Stand Sit to Stand: Min assist;Mod assist Stand pivot transfers: Min assist       General transfer comment: cues for UE/LE placement  Ambulation/Gait Ambulation/Gait assistance: Min assist Gait Distance (Feet): 3 Feet Assistive device: Rolling walker (2 wheeled) Gait Pattern/deviations: Step-to pattern;Decreased step length - right;Decreased step length - left;Shuffle;Trunk flexed Gait velocity: decr   General Gait Details: cues for posture, position from RW and sequence; distance ltd by c/o dizziness   Stairs             Wheelchair Mobility    Modified Rankin (Stroke Patients Only)       Balance                                            Cognition Arousal/Alertness: Awake/alert Behavior During Therapy: WFL for tasks assessed/performed Overall Cognitive Status: Within Functional Limits for tasks assessed                                         Exercises Total Joint Exercises Ankle Circles/Pumps: AROM;Both;15 reps;Supine Quad Sets: AROM;Both;10 reps;Supine Heel Slides: AAROM;Left;20 reps;Supine Hip ABduction/ADduction: AAROM;Left;15 reps;Supine    General Comments General comments (skin integrity, edema, etc.): extra time for all activities due to pain      Pertinent Vitals/Pain Pain Assessment: 0-10 Pain Score: 8  Pain Location: L hip Pain Descriptors / Indicators: Aching;Sore;Spasm Pain Intervention(s): Limited activity within patient's tolerance;Premedicated before session;Monitored during session;Ice applied    Home Living Family/patient expects to be discharged to:: Private residence Living Arrangements: Spouse/significant other Available Help at Discharge: Family         Home Equipment: (borrowed 3:1) Additional Comments: walk in shower down one level from main level    Prior Function Level of Independence: Independent          PT Goals (current goals can now be found in the care plan section) Acute Rehab PT Goals Patient Stated Goal: Regain IND  PT Goal Formulation: With patient Time For Goal Achievement: 10/07/18 Potential to Achieve Goals: Good Progress towards PT goals: Not progressing toward goals - comment(ltd by c/o dizziness - Rn present)    Frequency    7X/week      PT Plan Current plan remains appropriate    Co-evaluation  AM-PAC PT "6 Clicks" Mobility   Outcome Measure  Help needed turning from your back to your side while in a flat bed without using bedrails?: A Little Help needed moving from lying on your back to sitting on the side of a flat bed without using bedrails?: A Little Help needed moving to and from a bed to a chair (including a wheelchair)?: A Little Help needed standing up from a chair using your arms (e.g., wheelchair or bedside chair)?: A Little Help needed to walk in hospital room?: A Lot Help needed climbing 3-5 steps with  a railing? : A Lot 6 Click Score: 16    End of Session Equipment Utilized During Treatment: Gait belt Activity Tolerance: Patient limited by pain;Patient limited by fatigue;Other (comment)(dizziness) Patient left: in bed;with call bell/phone within reach;with nursing/sitter in room Nurse Communication: Mobility status PT Visit Diagnosis: Difficulty in walking, not elsewhere classified (R26.2)     Time: 3244-0102 PT Time Calculation (min) (ACUTE ONLY): 26 min  Charges:  $Gait Training: 8-22 mins $Therapeutic Exercise: 8-22 mins                     [place] Pager [phone] Office [phone]    Keith Hernandez 10/01/2018, 12:08 PM

## 2018-10-02 MED ORDER — SODIUM CHLORIDE 0.9 % IV BOLUS
500.0000 mL | Freq: Once | INTRAVENOUS | Status: AC
  Administered 2018-10-02: 500 mL via INTRAVENOUS

## 2018-10-02 NOTE — Progress Notes (Signed)
Physical Therapy Treatment Patient Details Name: Keith Hernandez MRN: [id number] DOB: 1951/01/15 Today's Date: 10/02/2018    History of Present Illness Pt s/p L THR and with hx of AAA    PT Comments    Pt continues motivated to progress but ltd this pm by orthostatic BP; supine 134/72; standing 108/69; after 5 min marching in place and standing to urinate 88/64 - RN aware.   Follow Up Recommendations  Follow surgeon's recommendation for DC plan and follow-up therapies     Equipment Recommendations  None recommended by PT    Recommendations for Other Services OT consult     Precautions / Restrictions Precautions Precautions: Fall Restrictions Weight Bearing Restrictions: No Other Position/Activity Restrictions: WBAT    Mobility  Bed Mobility               General bed mobility comments: Pt up in chair and requests back to same  Transfers Overall transfer level: Needs assistance Equipment used: Rolling walker (2 wheeled) Transfers: Sit to/from Stand Sit to Stand: Min guard         General transfer comment: cues for UE placement  Ambulation/Gait         Gait velocity: decr   General Gait Details: Pt stood ~ 5 minutes to urinate and returned to sitting wtih BP 88/64   Stairs             Wheelchair Mobility    Modified Rankin (Stroke Patients Only)       Balance Overall balance assessment: Mild deficits observed, not formally tested                                          Cognition Arousal/Alertness: Awake/alert Behavior During Therapy: WFL for tasks assessed/performed Overall Cognitive Status: Within Functional Limits for tasks assessed                                        Exercises Total Joint Exercises Ankle Circles/Pumps: AROM;Both;15 reps;Supine Quad Sets: AROM;Both;10 reps;Supine Heel Slides: AAROM;Left;20 reps;Supine Hip ABduction/ADduction: AAROM;Left;15 reps;Supine    General Comments         Pertinent Vitals/Pain Pain Assessment: 0-10 Pain Score: 6  Pain Location: L hip Pain Descriptors / Indicators: Aching;Sore Pain Intervention(s): Limited activity within patient's tolerance;Monitored during session;Premedicated before session;Ice applied    Home Living                      Prior Function            PT Goals (current goals can now be found in the care plan section) Acute Rehab PT Goals Patient Stated Goal: to get rid of dizziness PT Goal Formulation: With patient Time For Goal Achievement: 10/07/18 Potential to Achieve Goals: Good Progress towards PT goals: Not progressing toward goals - comment(orthostatic)    Frequency    7X/week      PT Plan Current plan remains appropriate    Co-evaluation              AM-PAC PT "6 Clicks" Mobility   Outcome Measure  Help needed turning from your back to your side while in a flat bed without using bedrails?: A Little Help needed moving from lying on your back to sitting on the side of a flat  bed without using bedrails?: A Little Help needed moving to and from a bed to a chair (including a wheelchair)?: A Little Help needed standing up from a chair using your arms (e.g., wheelchair or bedside chair)?: A Little Help needed to walk in hospital room?: A Little Help needed climbing 3-5 steps with a railing? : A Little 6 Click Score: 18    End of Session Equipment Utilized During Treatment: Gait belt Activity Tolerance: Patient tolerated treatment well Patient left: in chair;with call bell/phone within reach;with family/visitor present Nurse Communication: Mobility status PT Visit Diagnosis: Difficulty in walking, not elsewhere classified (R26.2)     Time: 1420-1450 PT Time Calculation (min) (ACUTE ONLY): 30 min  Charges:  $Therapeutic Exercise: 8-22 mins $Therapeutic Activity: 8-22 mins                     [place] Pager [phone] Office  [phone]    Kenetha Cozza 10/02/2018, 4:02 PM

## 2018-10-02 NOTE — Progress Notes (Signed)
Occupational Therapy Treatment Patient Details Name: Keith Hernandez MRN: [id number] DOB: Nov 27, 1950 Today's Date: 10/02/2018    History of present illness Pt s/p L THR and with hx of AAA   OT comments  Pt progressed with mobility this date and made it into BR. Pt stood to brush teeth, and made it on to toilet with BSC overtop. Pt completing these standing activities with min A- min guard A. Pt reporting increased stiffness and demonstrating inability to clear foot from floor with functional mobility. Once on toilet pt reported "fuzziness" and light headedness that he describes as tunnel vision. Applied cool clothes to pt head and neck, with symptoms progressing. OT called RN into room, pt began to have fainting spell on toilet while RN taking blood pressure. Pt requiring total A x2 to recliner, placed in supine. Left pt being tended to with RN for fluids and BP stabilization. BP was 104/71 when on toilet, increasing to systolics of 606 when back in chair. OT will continue to follow pt acutely, d/c plans remain appropriate.   Follow Up Recommendations  Supervision/Assistance - 24 hour    Equipment Recommendations  None recommended by OT    Recommendations for Other Services      Precautions / Restrictions Precautions Precautions: Fall Precaution Comments: reports "fuzziness" and will pass out Restrictions Weight Bearing Restrictions: No Other Position/Activity Restrictions: WBAT       Mobility Bed Mobility Overal bed mobility: Needs Assistance Bed Mobility: Supine to Sit;Sit to Supine     Supine to sit: Mod assist     General bed mobility comments: cues for sequencing of BLEs, needing mod A to manage LB this date due to pain and stiffness  Transfers Overall transfer level: Needs assistance Equipment used: Rolling walker (2 wheeled) Transfers: Sit to/from Stand Sit to Stand: Min assist         General transfer comment: cues for UE placement    Balance Overall balance  assessment: Mild deficits observed, not formally tested                                         ADL either performed or assessed with clinical judgement   ADL Overall ADL's : Needs assistance/impaired     Grooming: Min guard;Standing;Oral care Grooming Details (indicate cue type and reason): standing at sink to brush teeth             Lower Body Dressing: Total assistance   Toilet Transfer: Minimal assistance;Stand-pivot;RW Toilet Transfer Details (indicate cue type and reason): BSC over toilet- t/f off toilet not tested due to pt passing out on toilet and needing total A x2 to chair         Functional mobility during ADLs: Minimal assistance;Rolling walker General ADL Comments: pt will report "fuzziness" and begin to pass out. Pt currently needing total A for LB ADLs and mod A for bed mobility due to pain. Min A for functional mobility this date due to increase pain and stiffness     Vision Patient Visual Report: No change from baseline     Perception     Praxis      Cognition Arousal/Alertness: Awake/alert Behavior During Therapy: WFL for tasks assessed/performed Overall Cognitive Status: Within Functional Limits for tasks assessed  Exercises     Shoulder Instructions       General Comments fainting episodes    Pertinent Vitals/ Pain       Pain Assessment: 0-10 Pain Score: 7  Pain Location: L hip Pain Descriptors / Indicators: Aching;Sore;Spasm Pain Intervention(s): Limited activity within patient's tolerance;Monitored during session;Repositioned;Ice applied;Premedicated before session  Home Living                                          Prior Functioning/Environment              Frequency  Min 2X/week        Progress Toward Goals  OT Goals(current goals can now be found in the care plan section)  Progress towards OT goals: Progressing toward  goals  Acute Rehab OT Goals Patient Stated Goal: to get rid of dizziness OT Goal Formulation: With patient Time For Goal Achievement: 10/08/18 Potential to Achieve Goals: Good  Plan Discharge plan remains appropriate;Frequency remains appropriate    Co-evaluation                 AM-PAC OT "6 Clicks" Daily Activity     Outcome Measure   Help from another person eating meals?: None Help from another person taking care of personal grooming?: A Little Help from another person toileting, which includes using toliet, bedpan, or urinal?: A Little Help from another person bathing (including washing, rinsing, drying)?: A Lot Help from another person to put on and taking off regular upper body clothing?: A Little Help from another person to put on and taking off regular lower body clothing?: Total 6 Click Score: 16    End of Session Equipment Utilized During Treatment: Gait belt;Rolling walker  OT Visit Diagnosis: Pain Pain - Right/Left: Left Pain - part of body: Hip   Activity Tolerance Other (comment)(session ended with pt fainting on toilet)   Patient Left in chair;with call bell/phone within reach;with chair alarm set(nursing tending to pt after fainting spell)   Nurse Communication Mobility status;Other (comment)(assist for fainting spell)        Time: 4132-4401 OT Time Calculation (min): 35 min  Charges: OT General Charges $OT Visit: 1 Visit OT Treatments $Self Care/Home Management : 23-37 mins  Zenovia Jarred, MSOT, OTR/L [place] OT/ Acute Relief OT [facility] Office: [place] 10/02/2018, 10:46 AM

## 2018-10-02 NOTE — Progress Notes (Addendum)
Dr. Ninfa Linden notified of pt fainting with physical therapy. 500cc bolus ordered by md. Vs obtained and sbp was 105. Pt placed back in chair from toilet by nursing staff. Pt head placed lower and fluids restarted. Pt was cool and clammy, eyes rolling in the back of his head and rn was able to arouse pt with a sternal run on toilet prior to being placed back in chair.

## 2018-10-02 NOTE — Progress Notes (Signed)
Pt sbp dropped to 88 with limited physical therapy this afternoon. Pt did not become dizzy or faint with this drop in bp. Pt was able to return to chair with physical therapy. Repeat bp once in chair was 110/54, hr 89. No s/s of distress or pain. rn will continue to monitor.

## 2018-10-02 NOTE — Progress Notes (Signed)
Subjective: 2 Days Post-Op Procedure(s) (LRB): LEFT TOTAL HIP ARTHROPLASTY ANTERIOR APPROACH (Left) Patient reports pain as moderate.  Having some problems with orthostatics and drop in BP when up with therapy.  Repsonded well to a fluid bolus and feels better now.  Objective: Vital signs in last 24 hours: Temp:  [97.6 F (36.4 C)-99.2 F (37.3 C)] 98.2 F (36.8 C) (01/19 0607) Pulse Rate:  [87-97] 94 (01/19 1100) Resp:  [15-16] 16 (01/19 0607) BP: (129-156)/(72-89) 145/89 (01/19 1100) SpO2:  [93 %-99 %] 93 % (01/19 0607)  Intake/Output from previous day: 01/18 0701 - 01/19 0700 In: 2220.7 [P.O.:1430; I.V.:790.7] Out: 1825 [Urine:1825] Intake/Output this shift: Total I/O In: 1220 [P.O.:720; IV Piggyback:500] Out: 950 [Urine:950]  Recent Labs    10/01/18 0317  HGB 12.2*   Recent Labs    10/01/18 0317  WBC 12.5*  RBC 4.26  HCT 37.9*  PLT 168   Recent Labs    10/01/18 0317  NA 134*  K 4.1  CL 100  CO2 24  BUN 13  CREATININE 0.93  GLUCOSE 132*  CALCIUM 8.5*   No results for input(s): LABPT, INR in the last 72 hours.  Sensation intact distally Intact pulses distally Dorsiflexion/Plantar flexion intact Incision: dressing C/D/I  Assessment/Plan: 2 Days Post-Op Procedure(s) (LRB): LEFT TOTAL HIP ARTHROPLASTY ANTERIOR APPROACH (Left) Up with therapy Plan for discharge tomorrow Discharge home with home health    Mcarthur Rossetti 10/02/2018, 11:43 AM

## 2018-10-02 NOTE — Discharge Instructions (Signed)

## 2018-10-03 ENCOUNTER — Encounter (HOSPITAL_COMMUNITY): Payer: Self-pay | Admitting: Orthopaedic Surgery

## 2018-10-03 LAB — BASIC METABOLIC PANEL
Anion gap: 8 (ref 5–15)
BUN: 12 mg/dL (ref 8–23)
CO2: 24 mmol/L (ref 22–32)
Calcium: 8 mg/dL — ABNORMAL LOW (ref 8.9–10.3)
Chloride: 104 mmol/L (ref 98–111)
Creatinine, Ser: 1 mg/dL (ref 0.61–1.24)
GFR calc Af Amer: >60 mL/min (ref >60)
GFR calc non Af Amer: >60 mL/min (ref >60)
Glucose, Bld: 138 mg/dL — ABNORMAL HIGH (ref 70–99)
Potassium: 3.5 mmol/L (ref 3.5–5.1)
Sodium: 136 mmol/L (ref 135–145)

## 2018-10-03 LAB — CBC
HCT: 35.5 % — ABNORMAL LOW (ref 39.0–52.0)
Hemoglobin: 11.4 g/dL — ABNORMAL LOW (ref 13.0–17.0)
MCH: 29.2 pg (ref 26.0–34.0)
MCHC: 32.1 g/dL (ref 30.0–36.0)
MCV: 90.8 fL (ref 80.0–100.0)
Platelets: 141 10*3/uL — ABNORMAL LOW (ref 150–400)
RBC: 3.91 MIL/uL — ABNORMAL LOW (ref 4.22–5.81)
RDW: 13.1 % (ref 11.5–15.5)
WBC: 10.9 10*3/uL — ABNORMAL HIGH (ref 4.0–10.5)
nRBC: 0 % (ref 0.0–0.2)

## 2018-10-03 NOTE — Progress Notes (Signed)
Patient ID: Keith Hernandez, male   DOB: 06/06/1951, 68 y.o.   MRN: [id number] Given his lack of progress with mobility due to his orthostatic hypotension, I feel comfortable keeping him here today for one more day of monitoring and therapy.  Encouraged him to not take a narcotic just before therapy.  BP meds on hold.  Hopefully can discharge to home tomorrow.

## 2018-10-03 NOTE — Progress Notes (Signed)
Physical Therapy Treatment Patient Details Name: Keith Hernandez MRN: [id number] DOB: 1950-11-29 Today's Date: 10/03/2018    History of Present Illness Pt s/p L THR and with hx of AAA    PT Comments    Pt feeling much improved and with marked improvement in activity tolerance.  Pt with no c/o dizziness despite BPs as follows; sitting 147/78, stand 131/84, standing 2 minutes 98/76 and ambulating 114/80.   Follow Up Recommendations  Follow surgeon's recommendation for DC plan and follow-up therapies     Equipment Recommendations  None recommended by PT    Recommendations for Other Services OT consult     Precautions / Restrictions Precautions Precautions: Fall Restrictions Weight Bearing Restrictions: No Other Position/Activity Restrictions: WBAT    Mobility  Bed Mobility Overal bed mobility: Needs Assistance Bed Mobility: Supine to Sit     Supine to sit: Min guard     General bed mobility comments: cues for sequence and use of R LE to self assist  Transfers Overall transfer level: Needs assistance Equipment used: Rolling walker (2 wheeled) Transfers: Sit to/from Stand Sit to Stand: Min guard;Supervision         General transfer comment: cues for UE placement and LE management  Ambulation/Gait Ambulation/Gait assistance: Min guard Gait Distance (Feet): 124 Feet Assistive device: Rolling walker (2 wheeled) Gait Pattern/deviations: Step-to pattern;Decreased step length - right;Decreased step length - left;Shuffle;Trunk flexed Gait velocity: decr   General Gait Details: Increased time with min cues for sequence and posture   Stairs             Wheelchair Mobility    Modified Rankin (Stroke Patients Only)       Balance Overall balance assessment: Mild deficits observed, not formally tested                                          Cognition Arousal/Alertness: Awake/alert Behavior During Therapy: WFL for tasks  assessed/performed Overall Cognitive Status: Within Functional Limits for tasks assessed                                        Exercises Total Joint Exercises Ankle Circles/Pumps: AROM;Both;15 reps;Supine Quad Sets: AROM;Both;10 reps;Supine Heel Slides: AAROM;Left;20 reps;Supine Hip ABduction/ADduction: AAROM;Left;15 reps;Supine    General Comments        Pertinent Vitals/Pain Pain Assessment: 0-10 Pain Score: 4  Pain Location: L hip Pain Descriptors / Indicators: Aching;Sore;Burning Pain Intervention(s): Limited activity within patient's tolerance;Monitored during session;Ice applied    Home Living                      Prior Function            PT Goals (current goals can now be found in the care plan section) Acute Rehab PT Goals Patient Stated Goal: to get rid of dizziness PT Goal Formulation: With patient Time For Goal Achievement: 10/07/18 Potential to Achieve Goals: Good Progress towards PT goals: Progressing toward goals    Frequency    7X/week      PT Plan Current plan remains appropriate    Co-evaluation              AM-PAC PT "6 Clicks" Mobility   Outcome Measure  Help needed turning from your back to your side  while in a flat bed without using bedrails?: A Little Help needed moving from lying on your back to sitting on the side of a flat bed without using bedrails?: A Little Help needed moving to and from a bed to a chair (including a wheelchair)?: A Little Help needed standing up from a chair using your arms (e.g., wheelchair or bedside chair)?: A Little Help needed to walk in hospital room?: A Little Help needed climbing 3-5 steps with a railing? : A Little 6 Click Score: 18    End of Session Equipment Utilized During Treatment: Gait belt Activity Tolerance: Patient tolerated treatment well Patient left: in chair;with call bell/phone within reach Nurse Communication: Mobility status PT Visit Diagnosis:  Difficulty in walking, not elsewhere classified (R26.2)     Time: 5284-1324 PT Time Calculation (min) (ACUTE ONLY): 45 min  Charges:  $Gait Training: 23-37 mins $Therapeutic Exercise: 8-22 mins                     [place] Pager [phone] Office [phone]    Keith Hernandez 10/03/2018, 11:52 AM

## 2018-10-03 NOTE — Progress Notes (Signed)
Physical Therapy Treatment Patient Details Name: Keith Hernandez MRN: [id number] DOB: [birth year] Today's Date: 10/03/2018    History of Present Illness Pt s/p L THR and with hx of AAA    PT Comments    Pt progressing well with mobility this pm and with only transient c/o dizziness.  This pm, pt ambulated increased distance in hall, negotiated stairs and reviewed car transfers.  Pt hopeful for dc home tomorrow.   Follow Up Recommendations  Follow surgeon's recommendation for DC plan and follow-up therapies     Equipment Recommendations  None recommended by PT    Recommendations for Other Services OT consult     Precautions / Restrictions Precautions Precautions: Fall Restrictions Weight Bearing Restrictions: No Other Position/Activity Restrictions: WBAT    Mobility  Bed Mobility Overal bed mobility: Needs Assistance Bed Mobility: Supine to Sit     Supine to sit: Min guard     General bed mobility comments: cues for sequence and use of R LE to self assist  Transfers Overall transfer level: Needs assistance Equipment used: Rolling walker (2 wheeled) Transfers: Sit to/from Stand Sit to Stand: Min guard;Supervision         General transfer comment: cues for UE placement and LE management  Ambulation/Gait Ambulation/Gait assistance: Min guard Gait Distance (Feet): 240 Feet Assistive device: Rolling walker (2 wheeled) Gait Pattern/deviations: Step-to pattern;Decreased step length - right;Decreased step length - left;Shuffle;Trunk flexed Gait velocity: decr   General Gait Details: Increased time with min cues for sequence and posture   Stairs Stairs: Yes Stairs assistance: Min assist;Min guard Stair Management: One rail Right;Step to pattern;Forwards;With crutches Number of Stairs: 15 General stair comments: cues for sequence and foot/crutch placement.     Wheelchair Mobility    Modified Rankin (Stroke Patients Only)       Balance Overall balance  assessment: Mild deficits observed, not formally tested                                          Cognition Arousal/Alertness: Awake/alert Behavior During Therapy: WFL for tasks assessed/performed Overall Cognitive Status: Within Functional Limits for tasks assessed                                        Exercises Total Joint Exercises Ankle Circles/Pumps: AROM;Both;15 reps;Supine Quad Sets: AROM;Both;10 reps;Supine Heel Slides: AAROM;Left;20 reps;Supine Hip ABduction/ADduction: AAROM;Left;15 reps;Supine    General Comments        Pertinent Vitals/Pain Pain Assessment: 0-10 Pain Score: 5  Pain Location: L hip Pain Descriptors / Indicators: Aching;Sore;Burning Pain Intervention(s): Limited activity within patient's tolerance;Monitored during session;Premedicated before session;Ice applied    Home Living                      Prior Function            PT Goals (current goals can now be found in the care plan section) Acute Rehab PT Goals Patient Stated Goal: Regain IND PT Goal Formulation: With patient Time For Goal Achievement: 10/07/18 Potential to Achieve Goals: Good Progress towards PT goals: Progressing toward goals    Frequency    7X/week      PT Plan Current plan remains appropriate    Co-evaluation  AM-PAC PT "6 Clicks" Mobility   Outcome Measure  Help needed turning from your back to your side while in a flat bed without using bedrails?: A Little Help needed moving from lying on your back to sitting on the side of a flat bed without using bedrails?: A Little Help needed moving to and from a bed to a chair (including a wheelchair)?: A Little Help needed standing up from a chair using your arms (e.g., wheelchair or bedside chair)?: A Little Help needed to walk in hospital room?: A Little Help needed climbing 3-5 steps with a railing? : A Little 6 Click Score: 18    End of Session  Equipment Utilized During Treatment: Gait belt Activity Tolerance: Patient tolerated treatment well Patient left: in chair;with call bell/phone within reach Nurse Communication: Mobility status PT Visit Diagnosis: Difficulty in walking, not elsewhere classified (R26.2)     Time: 2025-4270 PT Time Calculation (min) (ACUTE ONLY): 40 min  Charges:  $Gait Training: 23-37 mins $Therapeutic Exercise: 8-22 mins $Therapeutic Activity: 8-22 mins                     Debe Coder PT Acute Rehabilitation Services Pager [phone] Office [phone]    Jadeyn Hargett 10/03/2018, 2:04 PM

## 2018-10-03 NOTE — Care Management Important Message (Signed)
Important Message  Patient Details  Name: Keith Hernandez MRN: [id number] Date of Birth: Jul 24, 1951   Medicare Important Message Given:  Yes    Kerin Salen 10/03/2018, 2:17 [place] Message  Patient Details  Name: Keith Hernandez MRN: [id number] Date of Birth: 1951-03-18   Medicare Important Message Given:  Yes    Kerin Salen 10/03/2018, 2:16 PM

## 2018-10-03 NOTE — Progress Notes (Signed)
Occupational Therapy Treatment Patient Details Name: Keith Hernandez MRN: [id number] DOB: 08/14/1951 Today's Date: 10/03/2018    History of present illness Pt s/p L THR and with hx of AAA   OT comments  Pt progressing with functional mobility this date, reports orthostatic symptoms improving after medication adjustment. Provided LB ADL training for pt this date showing AE options. Pt completing LB ADLs at min A level with AE and VC's. Pt reports interest in acquiring sock aide and long handled shoe horn- wife looking up options for purchase during session. Provided handouts for further carryover. Pt anticipates d/c tm. Will continue to follow acutely.  Follow Up Recommendations  Supervision/Assistance - 24 hour    Equipment Recommendations  Other (comment)(LB ADL kit)    Recommendations for Other Services      Precautions / Restrictions Precautions Precautions: Fall Precaution Comments: reports "fuzziness" and will pass out Restrictions Weight Bearing Restrictions: No Other Position/Activity Restrictions: WBAT       Mobility Bed Mobility                  Transfers Overall transfer level: Needs assistance Equipment used: Rolling walker (2 wheeled) Transfers: Sit to/from Stand Sit to Stand: Min guard;Supervision         General transfer comment: cues for UE placement and LE management    Balance Overall balance assessment: Mild deficits observed, not formally tested                                         ADL either performed or assessed with clinical judgement   ADL Overall ADL's : Needs assistance/impaired             Lower Body Bathing: Minimal assistance;With adaptive equipment;Cueing for compensatory techniques Lower Body Bathing Details (indicate cue type and reason): long handled sponge     Lower Body Dressing: Minimal assistance;With adaptive equipment;Cueing for compensatory techniques Lower Body Dressing Details (indicate cue  type and reason): with long handled reacher               General ADL Comments: focused session on LB ADL training this session with wife present      Vision Patient Visual Report: No change from baseline     Perception     Praxis      Cognition Arousal/Alertness: Awake/alert Behavior During Therapy: WFL for tasks assessed/performed Overall Cognitive Status: Within Functional Limits for tasks assessed                                          Exercises     Shoulder Instructions       General Comments      Pertinent Vitals/ Pain       Pain Assessment: Faces Pain Score: 5  Faces Pain Scale: Hurts a little bit Pain Location: L hip Pain Descriptors / Indicators: Aching;Sore;Burning Pain Intervention(s): Limited activity within patient's tolerance;Monitored during session  Home Living                                          Prior Functioning/Environment              Frequency  Min 2X/week  Progress Toward Goals  OT Goals(current goals can now be found in the care plan section)  Progress towards OT goals: Progressing toward goals  Acute Rehab OT Goals Patient Stated Goal: to go home tm OT Goal Formulation: With patient Time For Goal Achievement: 10/08/18 Potential to Achieve Goals: Good  Plan Discharge plan remains appropriate;Frequency remains appropriate    Co-evaluation                 AM-PAC OT "6 Clicks" Daily Activity     Outcome Measure   Help from another person eating meals?: None Help from another person taking care of personal grooming?: A Little Help from another person toileting, which includes using toliet, bedpan, or urinal?: A Little Help from another person bathing (including washing, rinsing, drying)?: A Little Help from another person to put on and taking off regular upper body clothing?: None Help from another person to put on and taking off regular lower body clothing?: A  Little 6 Click Score: 20    End of Session    OT Visit Diagnosis: Pain;Other abnormalities of gait and mobility (R26.89) Pain - Right/Left: Left Pain - part of body: Hip   Activity Tolerance Patient tolerated treatment well   Patient Left in bed;with bed alarm set;with family/visitor present   Nurse Communication          Time: 9753-0051 OT Time Calculation (min): 15 min  Charges: OT General Charges $OT Visit: 1 Visit OT Treatments $Self Care/Home Management : 8-22 mins  Zenovia Jarred, MSOT, OTR/L [place] Office: [place] 10/03/2018, 5:59 PM

## 2018-10-03 NOTE — Progress Notes (Signed)
Pt's pulse rate increased with activity, walking in hall with PT. Pt doing better today with ambulating. In no acute distress. Will continue to monitor.

## 2018-10-04 MED ORDER — METHOCARBAMOL 500 MG PO TABS: 500.0000 mg | ORAL_TABLET | Freq: Four times a day (QID) | ORAL | 0 refills | Status: DC | PRN

## 2018-10-04 MED ORDER — OXYCODONE HCL 5 MG PO TABS: 5.0000 mg | ORAL_TABLET | ORAL | 0 refills | Status: DC | PRN

## 2018-10-04 MED ORDER — ASPIRIN 81 MG PO CHEW: 81.0000 mg | CHEWABLE_TABLET | Freq: Two times a day (BID) | ORAL | 0 refills | Status: DC

## 2018-10-04 NOTE — Progress Notes (Signed)
Physical Therapy Treatment Patient Details Name: Keith Hernandez MRN: [id number] DOB: 10-Feb-1951 Today's Date: 10/04/2018    History of Present Illness Pt s/p L THR and with hx of AAA    PT Comments    POD # 4 am session Assisted OOB to amb to bathroom.  Demonstrated and instructed pt how to use belt to self assist LE as a leg lifter Assisted with raised toilet seat transfer with VC's on safety with turns.  Assisted with amb in hallway.  Pt required increased time with difficulty advancing L LE.  Assisted back to bed per pt request to practice and required increased assist to support l LE due to increased pain/cramping.  Pt plans to D/C to home today.  Pt will need another PT session to address HEP.   Follow Up Recommendations  Follow surgeon's recommendation for DC plan and follow-up therapies     Equipment Recommendations  None recommended by PT    Recommendations for Other Services       Precautions / Restrictions Precautions Precautions: Fall Restrictions Weight Bearing Restrictions: No Other Position/Activity Restrictions: WBAT    Mobility  Bed Mobility Overal bed mobility: Needs Assistance Bed Mobility: Sit to Supine           General bed mobility comments: Mod Assist L LE due to increased pain/cramping  Transfers Overall transfer level: Needs assistance Equipment used: Rolling walker (2 wheeled) Transfers: Sit to/from Stand Sit to Stand: Min guard;Supervision         General transfer comment: <25% cues for UE placement and LE management.  Also assisted with a toilet transfer.  Ambulation/Gait Ambulation/Gait assistance: Supervision;Min guard Gait Distance (Feet): 175 Feet Assistive device: Rolling walker (2 wheeled) Gait Pattern/deviations: Step-to pattern;Decreased step length - right;Decreased step length - left;Shuffle;Trunk flexed Gait velocity: decreased    General Gait Details: Increased time with min cues for sequence and posture   Stairs          General stair comments: pt stated he practiced stairs yesterday and felt no need to repeat today.  Tx session focus was HEP.    Wheelchair Mobility    Modified Rankin (Stroke Patients Only)       Balance                                            Cognition Arousal/Alertness: Awake/alert Behavior During Therapy: WFL for tasks assessed/performed Overall Cognitive Status: Within Functional Limits for tasks assessed                                        Exercises      General Comments        Pertinent Vitals/Pain Pain Assessment: 0-10 Pain Score: 5  Pain Location: L hip Pain Descriptors / Indicators: Aching;Sore;Burning;Operative site guarding Pain Intervention(s): Monitored during session;Repositioned;Ice applied    Home Living                      Prior Function            PT Goals (current goals can now be found in the care plan section) Progress towards PT goals: Progressing toward goals    Frequency    7X/week      PT Plan Current plan remains appropriate  Co-evaluation              AM-PAC PT "6 Clicks" Mobility   Outcome Measure  Help needed turning from your back to your side while in a flat bed without using bedrails?: A Little Help needed moving from lying on your back to sitting on the side of a flat bed without using bedrails?: A Little Help needed moving to and from a bed to a chair (including a wheelchair)?: A Little Help needed standing up from a chair using your arms (e.g., wheelchair or bedside chair)?: A Little Help needed to walk in hospital room?: A Little Help needed climbing 3-5 steps with a railing? : A Little 6 Click Score: 18    End of Session Equipment Utilized During Treatment: Gait belt Activity Tolerance: Patient tolerated treatment well Patient left: in bed;with call bell/phone within reach Nurse Communication: Mobility status(pt will need another PT session  ) PT Visit Diagnosis: Difficulty in walking, not elsewhere classified (R26.2)     Time: 8638-1771 PT Time Calculation (min) (ACUTE ONLY): 44 min  Charges:  $Gait Training: 23-37 mins $Self Care/Home Management: 8-22                     Rica Koyanagi  PTA Acute  Rehabilitation Services Pager      [phone] Office      [phone]

## 2018-10-04 NOTE — Progress Notes (Signed)
Patient assisted to meet spouse with transportation by the NA by wheelchair. All belongings sent home with patient. Discharge education provided. All questions addressed. Vital signs stable, pain controlled.

## 2018-10-04 NOTE — Discharge Summary (Signed)
Patient ID: ARLYN BUERKLE MRN: [id number] DOB/AGE: 1951-06-23 68 y.o.  Admit date: 09/30/2018 Discharge date: 10/04/2018  Admission Diagnoses:  Principal Problem:   Unilateral primary osteoarthritis, left hip Active Problems:   Status post total replacement of left hip   Discharge Diagnoses:  Same  Past Medical History:  Diagnosis Date  . AAA (abdominal aortic aneurysm) ([place])   . Arthritis   . Bulging of cervical intervertebral disc    C6-7  . Dupuytren's disease   . GERD (gastroesophageal reflux disease)    OTC meds pnr  . Hyperlipidemia   . Hypertension   . Migraines     Surgeries: Procedure(s): LEFT TOTAL HIP ARTHROPLASTY ANTERIOR APPROACH on 09/30/2018   Consultants:   Discharged Condition: Improved  Hospital Course: BLAIN HUNSUCKER is an 68 y.o. male who was admitted 09/30/2018 for operative treatment ofUnilateral primary osteoarthritis, left hip. Patient has severe unremitting pain that affects sleep, daily activities, and work/hobbies. After pre-op clearance the patient was taken to the operating room on 09/30/2018 and underwent  Procedure(s): LEFT TOTAL HIP ARTHROPLASTY ANTERIOR APPROACH.    Patient was given perioperative antibiotics:  Anti-infectives (From admission, onward)   Start     Dose/Rate Route Frequency Ordered Stop   09/30/18 1500  ceFAZolin (ANCEF) IVPB 1 g/50 mL premix     1 g 100 mL/hr over 30 Minutes Intravenous Every 6 hours 09/30/18 1245 09/30/18 2103   09/30/18 0645  ceFAZolin (ANCEF) IVPB 2g/100 mL premix     2 g 200 mL/hr over 30 Minutes Intravenous On call to O.R. 09/30/18 0631 09/30/18 0841       Patient was given sequential compression devices, early ambulation, and chemoprophylaxis to prevent DVT.  Patient benefited maximally from hospital stay and there were no complications.    Recent vital signs:  Patient Vitals for the past 24 hrs:  BP Temp Temp src Pulse Resp SpO2  10/04/18 0524 (!) 149/92 (!) 97.5 F (36.4 C) Oral 88 14 99 %   10/03/18 2157 (!) 147/81 99.1 F (37.3 C) Oral 90 14 98 %  10/03/18 1359 (!) 141/83 97.7 F (36.5 C) Oral (!) 108 16 98 %  10/03/18 1343 (!) 153/82 - - (!) 112 - 100 %  10/03/18 1340 106/74 - - (!) 136 16 100 %     Recent laboratory studies:  Recent Labs    10/03/18 0749  WBC 10.9*  HGB 11.4*  HCT 35.5*  PLT 141*  NA 136  K 3.5  CL 104  CO2 24  BUN 12  CREATININE 1.00  GLUCOSE 138*  CALCIUM 8.0*     Discharge Medications:   Allergies as of 10/04/2018   No Known Allergies     Medication List    STOP taking these medications   aspirin 81 MG tablet Replaced by:  aspirin 81 MG chewable tablet     TAKE these medications   aspirin 81 MG chewable tablet Chew 1 tablet (81 mg total) by mouth 2 (two) times daily. Replaces:  aspirin 81 MG tablet   GLUCOSAMINE PO Take 1 tablet by mouth daily.   ibuprofen 200 MG tablet Commonly known as:  ADVIL,MOTRIN Take 400 mg by mouth every 6 (six) hours as needed for headache or moderate pain.   methocarbamol 500 MG tablet Commonly known as:  ROBAXIN Take 1 tablet (500 mg total) by mouth every 6 (six) hours as needed for muscle spasms.   multivitamin tablet Take 1 tablet by mouth daily.   NASACORT ALLERGY  24HR 55 MCG/ACT Aero nasal inhaler Generic drug:  triamcinolone Place 1 spray into the nose daily as needed (for congestion).   olmesartan 40 MG tablet Commonly known as:  BENICAR Take 20 mg by mouth 2 (two) times daily.   omeprazole 20 MG capsule Commonly known as:  PRILOSEC Take 20 mg by mouth daily.   oxyCODONE 5 MG immediate release tablet Commonly known as:  Oxy IR/ROXICODONE Take 1-2 tablets (5-10 mg total) by mouth every 4 (four) hours as needed for moderate pain (pain score 4-6).   vitamin C 500 MG tablet Commonly known as:  ASCORBIC ACID Take 500 mg daily by mouth.   VITAMIN D3 PO Take 1 capsule by mouth daily.            Durable Medical Equipment  (From admission, onward)         Start      Ordered   09/30/18 1246  DME Walker rolling  Once    Question:  Patient needs a walker to treat with the following condition  Answer:  Status post total replacement of left hip   09/30/18 1245   09/30/18 1246  DME 3 n 1  Once     09/30/18 1245          Diagnostic Studies: Dg Pelvis Portable  Result Date: 09/30/2018 CLINICAL DATA:  Primary osteoarthritis of the left hip. Status post left total hip prosthesis insertion. EXAM: PORTABLE PELVIS 1-2 VIEWS COMPARISON:  Radiographs dated 07/27/2018 FINDINGS: AP view of the pelvis demonstrates that the patient has undergone left total hip prosthesis insertion. The components appear in excellent position in the AP projection. No fractures. No dislocation. Postsurgical air in the soft tissues lateral to the left hip. Moderate arthritis of the right hip is noted. IMPRESSION: Satisfactory appearance of the left hip in the AP projection after left hip arthroplasty. Electronically Signed   By: Lorriane Shire M.D.   On: 09/30/2018 10:53   Dg C-arm 1-60 Min-no Report  Result Date: 09/30/2018 Fluoroscopy was utilized by the requesting physician.  No radiographic interpretation.   Dg Hip Port Unilat With Pelvis 1v Left  Result Date: 09/30/2018 CLINICAL DATA:  Intra op left side anterior approach total hip replacement. Hx of OA. Imaging done in [facility] OR Rm 10. EXAM: DG HIP (WITH OR WITHOUT PELVIS) 1V PORT LEFT; DG C-ARM 1-60 MIN-NO REPORT COMPARISON:  None. FINDINGS: Portable operative images demonstrate a total left hip arthroplasty that appears well seated and aligned. No acute fracture or evidence of an operative complication. IMPRESSION: Well-positioned total left hip arthroplasty. Electronically Signed   By: Lajean Manes M.D.   On: 09/30/2018 10:00    Disposition: Discharge disposition: 01-Home or Self Care         Follow-up Information    Mcarthur Rossetti, MD. Go on 10/17/2018.   Specialty:  Orthopedic Surgery Why:  Your appointment has been  made for 9:15 am Contact information: [place] [address] [phone]        [facility] Follow up.   Specialty:  [place] Why:  You will be seen by HHPT for 5 visits prior to follow up with MD Contact information: [address] [place] [id number] [phone]            Signed: Mcarthur Rossetti 10/04/2018, 7:18 AM

## 2018-10-04 NOTE — Progress Notes (Signed)
Patient ID: Keith Hernandez, male   DOB: 08-01-1951, 68 y.o.   MRN: [id number] Doing better and feeling better overall.  Left hip stable. Can be discharged to home today.

## 2018-10-04 NOTE — Progress Notes (Signed)
Physical Therapy Treatment Patient Details Name: Keith Hernandez MRN: [id number] DOB: 1951-05-10 Today's Date: 10/04/2018    History of Present Illness Pt s/p L THR and with hx of AAA    PT Comments    POD # 4 pm session Spouse present for education on all mobility and HEP.  Assisted pt OOB Demonstrated and instructed pt how to use belt to self assist LE as a leg lifter.   Assisted with amb in hallway.  Returned to room and Then returned to room to perform some TE's following HEP handout.  Instructed on proper tech, freq as well as use of ICE.   Addressed all mobility questions, discussed appropriate activity, educated on use of ICE.  Pt ready for D/C to home.    Follow Up Recommendations  Follow surgeon's recommendation for DC plan and follow-up therapies     Equipment Recommendations  None recommended by PT    Recommendations for Other Services       Precautions / Restrictions Precautions Precautions: Fall Restrictions Weight Bearing Restrictions: No Other Position/Activity Restrictions: WBAT    Mobility  Bed Mobility Overal bed mobility: Needs Assistance Bed Mobility: Supine to Sit     Supine to sit: Min guard     General bed mobility comments: pt used his elt to self assist  Transfers Overall transfer level: Needs assistance Equipment used: Rolling walker (2 wheeled) Transfers: Sit to/from Stand Sit to Stand: Min guard;Supervision         General transfer comment: <25% cues for UE placement and LE management.  Spouse present to also assist.    Ambulation/Gait Ambulation/Gait assistance: Supervision;Min guard Gait Distance (Feet): 175 Feet Assistive device: Rolling walker (2 wheeled) Gait Pattern/deviations: Step-to pattern;Decreased step length - right;Decreased step length - left;Shuffle;Trunk flexed Gait velocity: decreased    General Gait Details: Increased time with min cues for sequence and posture.  Spouse present to also assist.    Stairs         General stair comments: pt stated he practiced stairs yesterday and felt no need to repeat today.  Tx session focus was HEP.    Wheelchair Mobility    Modified Rankin (Stroke Patients Only)       Balance                                            Cognition Arousal/Alertness: Awake/alert Behavior During Therapy: WFL for tasks assessed/performed Overall Cognitive Status: Within Functional Limits for tasks assessed                                        Exercises   Total Hip Replacement TE's 10 reps ankle pumps 10 reps knee presses 10 reps heel slides 10 reps SAQ's 10 reps ABD Followed by ICE    General Comments        Pertinent Vitals/Pain Pain Assessment: 0-10 Pain Score: 5  Pain Location: L hip Pain Descriptors / Indicators: Aching;Sore;Burning;Operative site guarding Pain Intervention(s): Monitored during session;Repositioned;Ice applied    Home Living                      Prior Function            PT Goals (current goals can now be found in the care  plan section) Progress towards PT goals: Progressing toward goals    Frequency    7X/week      PT Plan Current plan remains appropriate    Co-evaluation              AM-PAC PT "6 Clicks" Mobility   Outcome Measure  Help needed turning from your back to your side while in a flat bed without using bedrails?: A Little Help needed moving from lying on your back to sitting on the side of a flat bed without using bedrails?: A Little Help needed moving to and from a bed to a chair (including a wheelchair)?: A Little Help needed standing up from a chair using your arms (e.g., wheelchair or bedside chair)?: A Little Help needed to walk in hospital room?: A Little Help needed climbing 3-5 steps with a railing? : A Little 6 Click Score: 18    End of Session Equipment Utilized During Treatment: Gait belt Activity Tolerance: Patient tolerated  treatment well Patient left: in chair Nurse Communication: (pt ready for D/C to home ) PT Visit Diagnosis: Difficulty in walking, not elsewhere classified (R26.2)     Time: 1062-6948 PT Time Calculation (min) (ACUTE ONLY): 24 min  Charges:  $Gait Training: 8-22 mins $Therapeutic Exercise: 8-22 mins                    Rica Koyanagi  PTA Acute  Rehabilitation Services Pager      [phone] Office      [phone]

## 2018-10-05 DIAGNOSIS — G43909 Migraine, unspecified, not intractable, without status migrainosus: Secondary | ICD-10-CM | POA: Diagnosis not present

## 2018-10-05 DIAGNOSIS — M720 Palmar fascial fibromatosis [Dupuytren]: Secondary | ICD-10-CM | POA: Diagnosis not present

## 2018-10-05 DIAGNOSIS — Z87891 Personal history of nicotine dependence: Secondary | ICD-10-CM | POA: Diagnosis not present

## 2018-10-05 DIAGNOSIS — M5020 Other cervical disc displacement, unspecified cervical region: Secondary | ICD-10-CM | POA: Diagnosis not present

## 2018-10-05 DIAGNOSIS — I10 Essential (primary) hypertension: Secondary | ICD-10-CM | POA: Diagnosis not present

## 2018-10-05 DIAGNOSIS — Z471 Aftercare following joint replacement surgery: Secondary | ICD-10-CM | POA: Diagnosis not present

## 2018-10-05 DIAGNOSIS — K219 Gastro-esophageal reflux disease without esophagitis: Secondary | ICD-10-CM | POA: Diagnosis not present

## 2018-10-05 DIAGNOSIS — Z79891 Long term (current) use of opiate analgesic: Secondary | ICD-10-CM | POA: Diagnosis not present

## 2018-10-05 DIAGNOSIS — Z7982 Long term (current) use of aspirin: Secondary | ICD-10-CM | POA: Diagnosis not present

## 2018-10-05 DIAGNOSIS — I714 Abdominal aortic aneurysm, without rupture: Secondary | ICD-10-CM | POA: Diagnosis not present

## 2018-10-05 DIAGNOSIS — E785 Hyperlipidemia, unspecified: Secondary | ICD-10-CM | POA: Diagnosis not present

## 2018-10-05 DIAGNOSIS — Z96642 Presence of left artificial hip joint: Secondary | ICD-10-CM | POA: Diagnosis not present

## 2018-10-05 DIAGNOSIS — Z9181 History of falling: Secondary | ICD-10-CM | POA: Diagnosis not present

## 2018-10-06 ENCOUNTER — Ambulatory Visit (INDEPENDENT_AMBULATORY_CARE_PROVIDER_SITE_OTHER): Payer: Medicare Other | Admitting: Orthopaedic Surgery

## 2018-10-07 ENCOUNTER — Other Ambulatory Visit: Payer: Self-pay | Admitting: Orthopaedic Surgery

## 2018-10-07 DIAGNOSIS — G43909 Migraine, unspecified, not intractable, without status migrainosus: Secondary | ICD-10-CM | POA: Diagnosis not present

## 2018-10-07 DIAGNOSIS — Z471 Aftercare following joint replacement surgery: Secondary | ICD-10-CM | POA: Diagnosis not present

## 2018-10-07 DIAGNOSIS — M720 Palmar fascial fibromatosis [Dupuytren]: Secondary | ICD-10-CM | POA: Diagnosis not present

## 2018-10-07 DIAGNOSIS — I10 Essential (primary) hypertension: Secondary | ICD-10-CM | POA: Diagnosis not present

## 2018-10-07 DIAGNOSIS — M5020 Other cervical disc displacement, unspecified cervical region: Secondary | ICD-10-CM | POA: Diagnosis not present

## 2018-10-07 DIAGNOSIS — I714 Abdominal aortic aneurysm, without rupture: Secondary | ICD-10-CM | POA: Diagnosis not present

## 2018-10-10 DIAGNOSIS — I714 Abdominal aortic aneurysm, without rupture: Secondary | ICD-10-CM | POA: Diagnosis not present

## 2018-10-10 DIAGNOSIS — M5020 Other cervical disc displacement, unspecified cervical region: Secondary | ICD-10-CM | POA: Diagnosis not present

## 2018-10-10 DIAGNOSIS — Z471 Aftercare following joint replacement surgery: Secondary | ICD-10-CM | POA: Diagnosis not present

## 2018-10-10 DIAGNOSIS — M720 Palmar fascial fibromatosis [Dupuytren]: Secondary | ICD-10-CM | POA: Diagnosis not present

## 2018-10-10 DIAGNOSIS — G43909 Migraine, unspecified, not intractable, without status migrainosus: Secondary | ICD-10-CM | POA: Diagnosis not present

## 2018-10-10 DIAGNOSIS — I10 Essential (primary) hypertension: Secondary | ICD-10-CM | POA: Diagnosis not present

## 2018-10-11 ENCOUNTER — Other Ambulatory Visit: Payer: Self-pay | Admitting: Orthopaedic Surgery

## 2018-10-12 DIAGNOSIS — M5020 Other cervical disc displacement, unspecified cervical region: Secondary | ICD-10-CM | POA: Diagnosis not present

## 2018-10-12 DIAGNOSIS — I714 Abdominal aortic aneurysm, without rupture: Secondary | ICD-10-CM | POA: Diagnosis not present

## 2018-10-12 DIAGNOSIS — M720 Palmar fascial fibromatosis [Dupuytren]: Secondary | ICD-10-CM | POA: Diagnosis not present

## 2018-10-12 DIAGNOSIS — Z471 Aftercare following joint replacement surgery: Secondary | ICD-10-CM | POA: Diagnosis not present

## 2018-10-12 DIAGNOSIS — I10 Essential (primary) hypertension: Secondary | ICD-10-CM | POA: Diagnosis not present

## 2018-10-12 DIAGNOSIS — G43909 Migraine, unspecified, not intractable, without status migrainosus: Secondary | ICD-10-CM | POA: Diagnosis not present

## 2018-10-14 DIAGNOSIS — M720 Palmar fascial fibromatosis [Dupuytren]: Secondary | ICD-10-CM | POA: Diagnosis not present

## 2018-10-14 DIAGNOSIS — I10 Essential (primary) hypertension: Secondary | ICD-10-CM | POA: Diagnosis not present

## 2018-10-14 DIAGNOSIS — I714 Abdominal aortic aneurysm, without rupture: Secondary | ICD-10-CM | POA: Diagnosis not present

## 2018-10-14 DIAGNOSIS — M5020 Other cervical disc displacement, unspecified cervical region: Secondary | ICD-10-CM | POA: Diagnosis not present

## 2018-10-14 DIAGNOSIS — Z471 Aftercare following joint replacement surgery: Secondary | ICD-10-CM | POA: Diagnosis not present

## 2018-10-14 DIAGNOSIS — G43909 Migraine, unspecified, not intractable, without status migrainosus: Secondary | ICD-10-CM | POA: Diagnosis not present

## 2018-10-17 ENCOUNTER — Encounter (INDEPENDENT_AMBULATORY_CARE_PROVIDER_SITE_OTHER): Payer: Self-pay | Admitting: Orthopaedic Surgery

## 2018-10-17 ENCOUNTER — Ambulatory Visit (INDEPENDENT_AMBULATORY_CARE_PROVIDER_SITE_OTHER): Payer: Medicare Other | Admitting: Orthopaedic Surgery

## 2018-10-17 DIAGNOSIS — Z96642 Presence of left artificial hip joint: Secondary | ICD-10-CM

## 2018-10-17 NOTE — Progress Notes (Signed)
The patient is a 68 year old who is now 17 days status post a left total hip arthroplasty through direct anterior approach.  He is doing well overall.  He is on aspirin twice a day.  On exam he does have just a small seroma and I did drain about 30 cc of fluid from around the incision.  Incision itself is dry.  The Steri-Strips look good and I placed new ones.  His leg lengths are equal.  He is ambulating with a cane.  He still has some weak quads and is stiff but otherwise making progress.  This point he will go down to aspirin once a day for the rest this week and then can stop the aspirin.  He can also start ibuprofen as needed.  We had a long thorough discussion about the things he can and cannot do.  All question concerns were answered and addressed.  He will continue to increase his mobility as comfort allows.  We will see him back in 4 weeks to see how is doing overall but no x-rays are needed.

## 2018-10-18 ENCOUNTER — Other Ambulatory Visit: Payer: Self-pay | Admitting: Orthopaedic Surgery

## 2018-10-31 DIAGNOSIS — E7849 Other hyperlipidemia: Secondary | ICD-10-CM | POA: Diagnosis not present

## 2018-10-31 DIAGNOSIS — I10 Essential (primary) hypertension: Secondary | ICD-10-CM | POA: Diagnosis not present

## 2018-10-31 DIAGNOSIS — Z6828 Body mass index (BMI) 28.0-28.9, adult: Secondary | ICD-10-CM | POA: Diagnosis not present

## 2018-10-31 DIAGNOSIS — R002 Palpitations: Secondary | ICD-10-CM | POA: Diagnosis not present

## 2018-11-01 ENCOUNTER — Other Ambulatory Visit: Payer: Self-pay | Admitting: Orthopaedic Surgery

## 2018-11-03 ENCOUNTER — Ambulatory Visit: Payer: Medicare Other

## 2018-11-03 ENCOUNTER — Other Ambulatory Visit: Payer: Self-pay | Admitting: Family Medicine

## 2018-11-03 ENCOUNTER — Other Ambulatory Visit: Payer: Self-pay | Admitting: Internal Medicine

## 2018-11-03 DIAGNOSIS — R002 Palpitations: Secondary | ICD-10-CM

## 2018-11-09 DIAGNOSIS — R002 Palpitations: Secondary | ICD-10-CM | POA: Diagnosis not present

## 2018-11-14 ENCOUNTER — Encounter (INDEPENDENT_AMBULATORY_CARE_PROVIDER_SITE_OTHER): Payer: Self-pay | Admitting: Orthopaedic Surgery

## 2018-11-14 ENCOUNTER — Ambulatory Visit (INDEPENDENT_AMBULATORY_CARE_PROVIDER_SITE_OTHER): Payer: Medicare Other | Admitting: Orthopaedic Surgery

## 2018-11-14 DIAGNOSIS — Z96642 Presence of left artificial hip joint: Secondary | ICD-10-CM

## 2018-11-14 NOTE — Progress Notes (Signed)
The patient is 6 weeks status post a left total hip arthroplasty.  He says his knee is giving him some problems but otherwise he is doing well.  We had a long thorough discussion about antibiotics and whether needed for dental appointments we talked about his mobility and things he should and should not do.  As far as his knee goes sometimes it is a deep aching pain with some of its wear with stress of ligaments of the knee.  He declines any type of injection today says is not hurting him is bad enough.  I can easily put his right hip and right knee the range of motion as well as his left hip and left knee.  The left side of the operative side.  He seems like he is doing well overall.  All question concerns were answered and addressed.  I really do not need to see him back for 6 months with a standing low AP pelvis and lateral of his left operative hip.  If there is any knee issues before then he will come and see [place] for the knee and we can always x-ray the knee.

## 2018-11-18 NOTE — Care Plan (Signed)
RNCM met with patient in office at 6 week follow up appointment with MD. Reviewed role again of RNCM. Overall, patient verbalizes that he is doing very well from the Left hip standpoint, but is having pain in the Left knee. He has stopped some of the exercises provided by therapy that he thought were causing increased pain. All questions answered by MD regarding dental cleaning prophylaxis as well as probable cause of knee pain. Patient declined steroid injection today into Left knee. Follow up scheduled with Dr. Blackman in 6 months on 05/16/19.  

## 2019-01-04 ENCOUNTER — Telehealth (INDEPENDENT_AMBULATORY_CARE_PROVIDER_SITE_OTHER): Payer: Self-pay | Admitting: *Deleted

## 2019-01-04 NOTE — Telephone Encounter (Signed)
RNCM attempted 90 day phone call for Ortho Bundle. Left VM requesting call back.

## 2019-01-04 NOTE — Telephone Encounter (Signed)
90 day Ortho bundle call completed. Survey completed.

## 2019-01-04 NOTE — Care Plan (Signed)
RNCM spoke with patient for 90 day call. Patient states he is very pleased and doing extremely well. Some slight weakness remains with lifting leg up as if climbing stairs at 90 degrees. But overall, no real pain. Turning over in bed at night has greatly improved since last conversation. Reminded of scheduled appointment on 05/16/19 with MD. Reminded to contact RNCM or office with any questions or concerns. Survey completed.

## 2019-01-23 DIAGNOSIS — L814 Other melanin hyperpigmentation: Secondary | ICD-10-CM | POA: Diagnosis not present

## 2019-01-23 DIAGNOSIS — L570 Actinic keratosis: Secondary | ICD-10-CM | POA: Diagnosis not present

## 2019-01-24 DIAGNOSIS — E7849 Other hyperlipidemia: Secondary | ICD-10-CM | POA: Diagnosis not present

## 2019-01-24 DIAGNOSIS — I10 Essential (primary) hypertension: Secondary | ICD-10-CM | POA: Diagnosis not present

## 2019-01-24 DIAGNOSIS — E559 Vitamin D deficiency, unspecified: Secondary | ICD-10-CM | POA: Diagnosis not present

## 2019-01-24 DIAGNOSIS — Z125 Encounter for screening for malignant neoplasm of prostate: Secondary | ICD-10-CM | POA: Diagnosis not present

## 2019-01-24 DIAGNOSIS — R82998 Other abnormal findings in urine: Secondary | ICD-10-CM | POA: Diagnosis not present

## 2019-01-31 DIAGNOSIS — Z1331 Encounter for screening for depression: Secondary | ICD-10-CM | POA: Diagnosis not present

## 2019-01-31 DIAGNOSIS — K219 Gastro-esophageal reflux disease without esophagitis: Secondary | ICD-10-CM | POA: Diagnosis not present

## 2019-01-31 DIAGNOSIS — E785 Hyperlipidemia, unspecified: Secondary | ICD-10-CM | POA: Diagnosis not present

## 2019-01-31 DIAGNOSIS — I714 Abdominal aortic aneurysm, without rupture: Secondary | ICD-10-CM | POA: Diagnosis not present

## 2019-01-31 DIAGNOSIS — R002 Palpitations: Secondary | ICD-10-CM | POA: Diagnosis not present

## 2019-01-31 DIAGNOSIS — M720 Palmar fascial fibromatosis [Dupuytren]: Secondary | ICD-10-CM | POA: Diagnosis not present

## 2019-01-31 DIAGNOSIS — Z0000 Encounter for general adult medical examination without abnormal findings: Secondary | ICD-10-CM | POA: Diagnosis not present

## 2019-01-31 DIAGNOSIS — E559 Vitamin D deficiency, unspecified: Secondary | ICD-10-CM | POA: Diagnosis not present

## 2019-01-31 DIAGNOSIS — I10 Essential (primary) hypertension: Secondary | ICD-10-CM | POA: Diagnosis not present

## 2019-01-31 DIAGNOSIS — G43909 Migraine, unspecified, not intractable, without status migrainosus: Secondary | ICD-10-CM | POA: Diagnosis not present

## 2019-02-27 ENCOUNTER — Ambulatory Visit (INDEPENDENT_AMBULATORY_CARE_PROVIDER_SITE_OTHER): Payer: Medicare Other | Admitting: Podiatry

## 2019-02-27 ENCOUNTER — Encounter: Payer: Self-pay | Admitting: Podiatry

## 2019-02-27 ENCOUNTER — Ambulatory Visit (INDEPENDENT_AMBULATORY_CARE_PROVIDER_SITE_OTHER): Payer: Medicare Other

## 2019-02-27 ENCOUNTER — Other Ambulatory Visit: Payer: Self-pay

## 2019-02-27 VITALS — BP 155/99 | HR 61 | Temp 97.2°F | Resp 16

## 2019-02-27 DIAGNOSIS — M778 Other enthesopathies, not elsewhere classified: Secondary | ICD-10-CM

## 2019-02-27 DIAGNOSIS — M779 Enthesopathy, unspecified: Secondary | ICD-10-CM

## 2019-02-27 DIAGNOSIS — M205X1 Other deformities of toe(s) (acquired), right foot: Secondary | ICD-10-CM | POA: Diagnosis not present

## 2019-02-27 NOTE — Progress Notes (Signed)
   Subjective:    Patient ID: Keith Hernandez, male    DOB: 02-21-1951, 68 y.o.   MRN: [id number]  HPI    Review of Systems  All other systems reviewed and are negative.      Objective:   Physical Exam        Assessment & Plan:

## 2019-03-01 NOTE — Progress Notes (Signed)
Subjective:   Patient ID: Keith Hernandez, male   DOB: 68 y.o.   MRN: [id number]   HPI Patient presents stating he has had pain in the right big toe for the last few months and is been putting more pressure it as he had his other hip replaced in January.  Patient does not smoke likes to be active and states the pain is been present for several months and seems to bother him with ambulation   Review of Systems  All other systems reviewed and are negative.       Objective:  Physical Exam Vitals signs and nursing note reviewed.  Constitutional:      Appearance: He is well-developed.  Pulmonary:     Effort: Pulmonary effort is normal.  Musculoskeletal: Normal range of motion.  Skin:    General: Skin is warm.  Neurological:     Mental Status: He is alert.     Neurovascular status intact muscle strength found to be adequate range of motion within normal limits.  Patient is found to have inflammation pain distal to the first MPJ right on the plantar aspect of the hallux with moderate range of motion loss of the first MPJ but no crepitus of the joint noted.  Patient has good digital perfusion well oriented x3     Assessment:  Inflammatory tendinitis with the possibility for hallux limitus being contributory to problem     Plan:  H&P condition reviewed and today I went ahead and injected the plantar capsule 3 mg Kenalog 5 mg Xylocaine to reduce the inflammation advised on rigid bottom shoes and will be seen back as symptoms indicate  X-rays indicate that there is no signs of fracture or acute injury with hallux limitus present

## 2019-05-11 ENCOUNTER — Ambulatory Visit: Payer: Medicare Other | Admitting: Family

## 2019-05-11 ENCOUNTER — Ambulatory Visit (HOSPITAL_COMMUNITY): Payer: Medicare Other

## 2019-05-16 ENCOUNTER — Ambulatory Visit (INDEPENDENT_AMBULATORY_CARE_PROVIDER_SITE_OTHER): Payer: Medicare Other

## 2019-05-16 ENCOUNTER — Encounter: Payer: Self-pay | Admitting: Orthopaedic Surgery

## 2019-05-16 ENCOUNTER — Ambulatory Visit (INDEPENDENT_AMBULATORY_CARE_PROVIDER_SITE_OTHER): Payer: Medicare Other | Admitting: Orthopaedic Surgery

## 2019-05-16 ENCOUNTER — Ambulatory Visit: Payer: Self-pay | Admitting: Orthopaedic Surgery

## 2019-05-16 DIAGNOSIS — Z96642 Presence of left artificial hip joint: Secondary | ICD-10-CM

## 2019-05-16 NOTE — Progress Notes (Signed)
The patient is now 7 months status post a left total hip arthroplasty.  He says he only has a little bit of weakness when he flexes his hip when he sitting but otherwise no pain and no issues with that hip at all.  He is walking without a limp and without assistive device.  He has known osteoarthritis in his right hip that is only moderate and he will has a little bit of discomfort.  On exam his leg lengths are equal.  He has fluid range of motion of both hips with no pain in the groin at all even on the right hip that does show some arthritic changes.  AP pelvis and lateral of the left hip shows a well-seated implant with no complicating features.  There is arthritic changes on the right side.  This point follow-up can be as needed.  He understands that if he develops any kind of dull aching pain on the left side that does not want to go away we would x-ray that hip again.  If he worsens in any way on his right hip he will let us know.  All question concerns were answered and addressed.

## 2019-06-12 ENCOUNTER — Other Ambulatory Visit: Payer: Self-pay

## 2019-06-12 DIAGNOSIS — I714 Abdominal aortic aneurysm, without rupture, unspecified: Secondary | ICD-10-CM

## 2019-06-13 ENCOUNTER — Telehealth (HOSPITAL_COMMUNITY): Payer: Self-pay | Admitting: *Deleted

## 2019-06-13 NOTE — Telephone Encounter (Signed)
The above patient or their representative was contacted and gave the following answers to these questions:         Do you have any of the following symptoms?n  Fever                    Cough                   Shortness of breath  Do  you have any of the following other symptoms? n   muscle pain         vomiting,        diarrhea        rash         weakness        red eye        abdominal pain         bruising          bruising or bleeding              joint pain           severe headache    Have you been in contact with someone who was or has been sick in the past 2 weeks?n  Yes                 Unsure                         Unable to assess   Does the person that you were in contact with have any of the following symptoms?   Cough         shortness of breath           muscle pain         vomiting,            diarrhea            rash            weakness           fever            red eye           abdominal pain           bruising  or  bleeding                joint pain                severe headache               Have you  or someone you have been in contact with traveled internationally in th last month?         If yes, which countries?   Have you  or someone you have been in contact with traveled outside [place] in th last month?         If yes, which state and city?   COMMENTS OR ACTION PLAN FOR THIS PATIENT:         [facility]  

## 2019-06-14 ENCOUNTER — Ambulatory Visit (INDEPENDENT_AMBULATORY_CARE_PROVIDER_SITE_OTHER): Payer: Medicare Other | Admitting: Family

## 2019-06-14 ENCOUNTER — Ambulatory Visit (HOSPITAL_COMMUNITY)
Admission: RE | Admit: 2019-06-14 | Discharge: 2019-06-14 | Disposition: A | Discharge status: Home / Self Care | Payer: Medicare Other | Source: Ambulatory Visit | Attending: Family | Admitting: Family

## 2019-06-14 ENCOUNTER — Encounter: Payer: Self-pay | Admitting: Family

## 2019-06-14 ENCOUNTER — Other Ambulatory Visit: Payer: Self-pay

## 2019-06-14 VITALS — BP 160/87 | HR 60 | Temp 98.1°F | Resp 14 | Ht 71.0 in | Wt 207.1 lb

## 2019-06-14 DIAGNOSIS — I714 Abdominal aortic aneurysm, without rupture, unspecified: Secondary | ICD-10-CM

## 2019-06-14 DIAGNOSIS — Z87891 Personal history of nicotine dependence: Secondary | ICD-10-CM | POA: Diagnosis not present

## 2019-06-14 NOTE — Patient Instructions (Signed)
Before your next abdominal ultrasound:  Avoid gas forming foods and beverages the day before the test.   Take two Extra-Strength Gas-X capsules at bedtime the night before the test. Take another two Extra-Strength Gas-X capsules in the middle of the night if you get up to the restroom, if not, first thing in the morning with water.  Do not chew gum.     Abdominal Aortic Aneurysm  An aneurysm is a bulge in one of the blood vessels that carry blood away from the heart (artery). It happens when blood pushes up against a weak or damaged place in the wall of an artery. An abdominal aortic aneurysm happens in the main artery of the body (aorta). Some aneurysms may not cause problems. If it grows, it can burst or tear, causing bleeding inside the body. This is an emergency. It needs to be treated right away. What are the causes? The exact cause of this condition is not known. What increases the risk? The following may make you more likely to get this condition:  Being a male who is 60 years of age or older.  Being white (Caucasian).  Using tobacco.  Having a family history of aneurysms.  Having the following conditions: ? Hardening of the arteries (arteriosclerosis). ? Inflammation of the walls of an artery (arteritis). ? Certain genetic conditions. ? Being very overweight (obesity). ? An infection in the wall of the aorta (infectious aortitis). ? High cholesterol. ? High blood pressure (hypertension). What are the signs or symptoms? Symptoms depend on the size of the aneurysm and how fast it is growing. Most grow slowly and do not cause any symptoms. If symptoms do occur, they may include:  Pain in the belly (abdomen), side, or back.  Feeling full after eating only small amounts of food.  Feeling a throbbing lump in the belly. Symptoms that the aneurysm has burst (ruptured) include:  Sudden, very bad pain in the belly, side, or back.  Feeling sick to your stomach (nauseous).   Throwing up (vomiting).  Feeling light-headed or passing out. How is this treated? Treatment for this condition depends on:  The size of the aneurysm.  How fast it is growing.  Your age.  Your risk of having it burst. If your aneurysm is smaller than 2 inches (5 cm), your doctor may manage it by:  Checking it often to see if it is getting bigger. You may have an imaging test (ultrasound) to check it every 3-6 months, every year, or every few years.  Giving you medicines to: ? Control blood pressure. ? Treat pain. ? Fight infection. If your aneurysm is larger than 2 inches (5 cm), you may need surgery to fix it. Follow these instructions at home: Lifestyle  Do not use any products that have nicotine or tobacco in them. This includes cigarettes, e-cigarettes, and chewing tobacco. If you need help quitting, ask your doctor.  Get regular exercise. Ask your doctor what types of exercise are best for you. Eating and drinking  Eat a heart-healthy diet. This includes eating plenty of: ? Fresh fruits and vegetables. ? Whole grains. ? Low-fat (lean) protein. ? Low-fat dairy products.  Avoid foods that are high in saturated fat and cholesterol. These foods include red meat and some dairy products.  Do not drink alcohol if: ? Your doctor tells you not to drink. ? You are pregnant, may be pregnant, or are planning to become pregnant.  If you drink alcohol: ? Limit how much you use   to:  0-1 drink a day for women.  0-2 drinks a day for men. ? Be aware of how much alcohol is in your drink. In the [place] one drink equals any of these:  One typical bottle of beer (12 oz).  One-half glass of wine (5 oz).  One shot of hard liquor (1 oz). General instructions  Take over-the-counter and prescription medicines only as told by your doctor.  Keep your blood pressure within normal limits. Ask your doctor what your blood pressure should be.  Have your blood sugar (glucose) level  and cholesterol levels checked regularly. Keep your blood sugar level and cholesterol levels within normal limits.  Avoid heavy lifting and activities that take a lot of effort. Ask your doctor what activities are safe for you.  Keep all follow-up visits as told by your doctor. This is important. ? Talk to your doctor about regular screenings to see if the aneurysm is getting bigger. Contact a doctor if you:  Have pain in your belly, side, or back.  Have a throbbing feeling in your belly.  Have a family history of aneurysms. Get help right away if you:  Have sudden, bad pain in your belly, side, or back.  Feel sick to your stomach.  Throw up.  Have trouble pooping (constipation).  Have trouble peeing (urinating).  Feel light-headed.  Have a fast heart rate when you stand.  Have sweaty skin that is cold to the touch (clammy).  Have shortness of breath.  Have a fever. These symptoms may be an emergency. Do not wait to see if the symptoms will go away. Get medical help right away. Call your local emergency services (911 in the [place] Do not drive yourself to the hospital. Summary  An aneurysm is a bulge in one of the blood vessels that carry blood away from the heart (artery). Some aneurysms may not cause problems.  You may need to have yours checked often. If it grows, it can burst or tear. This causes bleeding inside the body. It needs to be treated right away.  Follow instructions from your doctor about healthy lifestyle changes.  Keep all follow-up visits as told by your doctor. This is important. This information is not intended to replace advice given to you by your health care provider. Make sure you discuss any questions you have with your health care provider. Document Released: 12/26/2012 Document Revised: 12/19/2018 Document Reviewed: 04/09/2018 Elsevier Patient Education  2020 Elsevier Inc.  

## 2019-06-14 NOTE — Progress Notes (Signed)
[facility]   CC: Follow up Abdominal Aortic Aneurysm  History of Present Illness  Keith Hernandez is a 68 y.o. (1950/11/08) male who is here for follow-up of a abdominal aortic aneurysm. This was initially detected in 2007 with a CT angiogram. He had a follow-up CT scan in 2015 which was done for abdominal pain. At that time his aneurysm measured 2.9 cm. Anultrasound in 2016 showed a 3.9 cm aneurysm, indicating 1 cm of growth within a year. Dr. Tarri Fuller uncomfortable about the measurements on his follow-up ultrasound and therefore wanted to have it repeated; that duplex measured 3.24.cm.   Pt denies any abdominal painor back pain.  He had a left total hip replacement in January 2020 and states he is doing well from this.   He denies any known history of stroke or TIA. He denies any claudication type symptoms with walking. He denies any history of cardiac problems other than when he was drinking too much coffee he had periods of palpations. The palpitations stopped when he stopped drinking coffee.  The patient suffers from hypercholesterolemia. He stopped the statin as he felt "horrible, everything from the waist down ached, very little energy". He also takes a daily aspirin and multivitamin.  He takes a daily ASA.    Pt states that he has white coat syndrome in re to his hypertension. He states at home, his blood pressure at home is 130-140/70-80.   Diabetic: No Tobacco use: former smoker, quit in 2000 years   Past Medical History:  Diagnosis Date  . AAA (abdominal aortic aneurysm) ([place])   . Arthritis   . Bulging of cervical intervertebral disc    C6-7  . Dupuytren's disease   . GERD (gastroesophageal reflux disease)    OTC meds pnr  . Hyperlipidemia   . Hypertension   . Migraines    Past Surgical History:  Procedure Laterality Date  . achilles reattachment    . FASCIOTOMY Left 10/11/2015   Procedure: LEFT HAND SUBTOTAL PALMAR  FASCIOTOMY WITH REPAIR RECONSTRUCTION ;  Surgeon: Roseanne Kaufman, MD;  Location: [facility];  Service: Orthopedics;  Laterality: Left;  . LESION DESTRUCTION Bilateral 07/29/2017   Procedure: EXCISION LIP LESION;  Surgeon: [name], DO;  Location: [facility];  Service: Plastics;  Laterality: Bilateral;  . LIPOMA EXCISION Right 07/29/2017   Procedure: EXCISION BACK LIPOMA;  Surgeon: [name], DO;  Location: [facility];  Service: Plastics;  Laterality: Right;  . TOTAL HIP ARTHROPLASTY Left 09/30/2018   Procedure: LEFT TOTAL HIP ARTHROPLASTY ANTERIOR APPROACH;  Surgeon: Mcarthur Rossetti, MD;  Location: [facility];  Service: Orthopedics;  Laterality: Left;   Social History Social History   Socioeconomic History  . Marital status: Married    Spouse name: Not on file  . Number of children: Not on file  . Years of education: Not on file  . Highest education level: Not on file  Occupational History  . Not on file  Social Needs  . Financial resource strain: Not on file  . Food insecurity    Worry: Not on file    Inability: Not on file  . Transportation needs    Medical: Not on file    Non-medical: Not on file  Tobacco Use  . Smoking status: Former Smoker    Packs/day: 1.00    Types: Cigarettes    Quit date: 07/15/1999    Years since quitting: 19.9  . Smokeless tobacco:  Never Used  Substance and Sexual Activity  . Alcohol use: Yes    Alcohol/week: 7.0 standard drinks    Types: 5 Glasses of wine, 2 Shots of liquor per week    Comment: social  . Drug use: No  . Sexual activity: Not on file  Lifestyle  . Physical activity    Days per week: Not on file    Minutes per session: Not on file  . Stress: Not on file  Relationships  . Social Herbalist on phone: Not on file    Gets together: Not on file    Attends religious service: Not on file    Active member of club or organization: Not on file     Attends meetings of clubs or organizations: Not on file    Relationship status: Not on file  . Intimate partner violence    Fear of current or ex partner: Not on file    Emotionally abused: Not on file    Physically abused: Not on file    Forced sexual activity: Not on file  Other Topics Concern  . Not on file  Social History Narrative  . Not on file   Family History Family History  Problem Relation Age of Onset  . Arthritis Mother   . Heart disease Mother   . Heart attack Mother   . Asthma Father   . Asthma Paternal Grandfather     Current Outpatient Medications on File Prior to Visit  Medication Sig Dispense Refill  . aspirin EC 81 MG tablet Take 81 mg by mouth daily.    . Cholecalciferol (VITAMIN D3 PO) Take 1 capsule by mouth daily.    . Glucosamine HCl (GLUCOSAMINE PO) Take 1 tablet by mouth daily.    . Multiple Vitamin (MULTIVITAMIN) tablet Take 1 tablet by mouth daily.    Marland Kitchen olmesartan (BENICAR) 40 MG tablet Take 20 mg by mouth 2 (two) times daily.     Marland Kitchen triamcinolone (NASACORT ALLERGY 24HR) 55 MCG/ACT AERO nasal inhaler Place 1 spray into the nose daily as needed (for congestion).     No current facility-administered medications on file prior to visit.    No Known Allergies  ROS: See HPI for pertinent positives and negatives.  Physical Examination  Vitals:   06/14/19 0958  BP: (!) 160/87  Pulse: 60  Resp: 14  Temp: 98.1 F (36.7 C)  TempSrc: Temporal  SpO2: 98%  Weight: 207 lb 1.6 oz (93.9 kg)  Height: 5\' 11"  (1.803 m)   Body mass index is 28.88 kg/m.  General: A&O x 3, WD, fit appearing male. HEENT: Grossly intact and WNL.  Pulmonary: Sym exp, respirations are non labored, good air movt, CTAB, no rales, rhonchi, or wheezes. Cardiac: Regular rhythm and rate, no detected murmur.  Carotid Bruits Right Left   Negative Negative   Adominal aortic pulse is not palpable Radial pulses are 2+ palpable                          VASCULAR EXAM:  LE Pulses Right Left       FEMORAL  not palpable (through layers of clothing)  not palpable        POPLITEAL  3+ palpable   3+ palpable       POSTERIOR TIBIAL  1+ palpable   1+ palpable        DORSALIS PEDIS      ANTERIOR TIBIAL 2+ palpable  2+ palpable     Gastrointestinal: soft, NTND, -G/R, - HSM, - masses palpated, - CVAT B. Musculoskeletal: M/S 5/5 throughout, Extremities without ischemic changes. Skin: No rashes, no ulcers, no cellulitis.   Neurologic: CN 2-12 intact, Pain and light touch intact in extremities are intact, Motor exam as listed above. Psychiatric: Normal thought content, mood appropriate to clinical situation.    DATA  AAA Duplex (06/14/2019):  Previous size: 4.1 cm, distal segment (Date: 01/27/18); Right CIA: 1.8 cm; Left CIA: 1.5 cm. Plaque noted on the posterior wall of the distal aorta. Limitations: air/bowel gas Current size: (Date: 06-14-19); Abdominal Aorta Findings: +-----------+-------+----------+----------+---------+--------+--------+ Location   AP (cm)Trans (cm)PSV (cm/s)Waveform ThrombusComments +-----------+-------+----------+----------+---------+--------+--------+ Proximal   2.58   2.38      39        biphasic                  +-----------+-------+----------+----------+---------+--------+--------+ Mid        2.80   -         74        biphasic                  +-----------+-------+----------+----------+---------+--------+--------+ Distal     4.10   -         64        biphasic                  +-----------+-------+----------+----------+---------+--------+--------+ RT CIA Prox1.8    1.3       102       triphasic                 +-----------+-------+----------+----------+---------+--------+--------+ LT CIA Prox1.6    1.7       81        triphasic                  +-----------+-------+----------+----------+---------+--------+--------+  Summary: Abdominal Aorta: The largest aortic diameter remains essentially unchanged compared to prior exam . Previous diameter measurement was 4.1 cm obtained on 01/27/2018. Todays diameter measures 4.1 cms AP.   Limited visualiztion of the distal aorta, further testing may be waranted.    Medical Decision Making  The patient is a 68 y.o. male who presents with asymptomatic AAA with no increase in size,  remains at 4.1 cm.  Prominent bilateral popliteal pulses: bilateral popliteal artery duplex on 01-27-18 showed normal examination with no evidence of popliteal artery aneurysms bilaterally.   Based on this patient's exam and diagnostic studies, the patient will follow up in 1 year  with the following studies: AAA duplex.  Consideration for repair of AAA would be made when the size is 5.5cm, growth > 1 cm/yr, and symptomatic status.  Abdominal aortic aneurysm less than 5-1/2 cm in diameter has less then 1/2% risk of rupture per year.        The patient was given information about AAA including signs, symptoms, treatment, and how to minimize the risk of enlargement and rupture of aneurysms.    I emphasized the importance of maximal medical management including strict control of  blood pressure, blood glucose, and lipid levels, antiplatelet agents, obtaining regular exercise, and continued cessation of smoking.   The patient was advised to call 911 should the patient experience sudden onset abdominal or back pain.   Thank you for allowing [place] to participate in this patient's care.  Clemon Chambers, RN, MSN, FNP-C [facility] [place] Office: [phone]  Clinic Physician: Scot Dock  06/14/2019, 10:08 AM

## 2019-06-16 DIAGNOSIS — Z23 Encounter for immunization: Secondary | ICD-10-CM | POA: Diagnosis not present

## 2019-10-04 ENCOUNTER — Ambulatory Visit: Payer: Medicare Other | Attending: Internal Medicine

## 2019-10-04 DIAGNOSIS — Z23 Encounter for immunization: Secondary | ICD-10-CM | POA: Diagnosis not present

## 2019-10-04 NOTE — Progress Notes (Signed)
   Covid-19 Vaccination Clinic  Name:  LARY YERGER    MRN: [id number] DOB: [birth year]  10/04/2019  Mr. Bredbenner was observed post Covid-19 immunization for 15 minutes without incidence. He was provided with Vaccine Information Sheet and instruction to access the V-Safe system.   Mr. Chatel was instructed to call 911 with any severe reactions post vaccine: Marland Kitchen Difficulty breathing  . Swelling of your face and throat  . A fast heartbeat  . A bad rash all over your body  . Dizziness and weakness    Immunizations Administered    Name Date Dose VIS Date Route   Pfizer COVID-19 Vaccine 10/04/2019  6:32 PM 0.3 mL 08/25/2019 Intramuscular   Manufacturer: [place]   Lot: [id number]   [place]: [id number]

## 2019-10-10 ENCOUNTER — Telehealth: Payer: Self-pay | Admitting: *Deleted

## 2019-10-10 NOTE — Telephone Encounter (Signed)
1 year Ortho bundle call and survey completed. 

## 2019-10-10 NOTE — Care Plan (Signed)
RNCM call to patient for 1 year Ortho bundle call. States his hip is doing extremely well. Still some mild soreness almost like a bruise, and very mild stiffness; patient has remained active and is walking/hiking. Very pleased since surgery.

## 2019-10-22 ENCOUNTER — Ambulatory Visit: Payer: Medicare Other | Attending: Internal Medicine

## 2019-10-22 DIAGNOSIS — Z23 Encounter for immunization: Secondary | ICD-10-CM | POA: Insufficient documentation

## 2019-10-22 NOTE — Progress Notes (Signed)
   Covid-19 Vaccination Clinic  Name:  Keith Hernandez    MRN: [id number] DOB: 12-Jun-1951  10/22/2019  Keith Hernandez was observed post Covid-19 immunization for 15 minutes without incidence. He was provided with Vaccine Information Sheet and instruction to access the V-Safe system.   Keith Hernandez was instructed to call 911 with any severe reactions post vaccine: Marland Kitchen Difficulty breathing  . Swelling of your face and throat  . A fast heartbeat  . A bad rash all over your body  . Dizziness and weakness    Immunizations Administered    Name Date Dose VIS Date Route   Pfizer COVID-19 Vaccine 10/22/2019  9:03 AM 0.3 mL 08/25/2019 Intramuscular   Manufacturer: [place]   Lot: [id number]   [place]: [id number]

## 2019-10-27 DIAGNOSIS — H43812 Vitreous degeneration, left eye: Secondary | ICD-10-CM | POA: Diagnosis not present

## 2019-10-27 DIAGNOSIS — H2513 Age-related nuclear cataract, bilateral: Secondary | ICD-10-CM | POA: Diagnosis not present

## 2019-10-27 DIAGNOSIS — H43391 Other vitreous opacities, right eye: Secondary | ICD-10-CM | POA: Diagnosis not present

## 2019-11-10 ENCOUNTER — Ambulatory Visit: Payer: Medicare Other

## 2019-11-17 DIAGNOSIS — H2513 Age-related nuclear cataract, bilateral: Secondary | ICD-10-CM | POA: Diagnosis not present

## 2019-11-17 DIAGNOSIS — H43813 Vitreous degeneration, bilateral: Secondary | ICD-10-CM | POA: Diagnosis not present

## 2020-01-02 DIAGNOSIS — H2513 Age-related nuclear cataract, bilateral: Secondary | ICD-10-CM | POA: Diagnosis not present

## 2020-01-02 DIAGNOSIS — H43813 Vitreous degeneration, bilateral: Secondary | ICD-10-CM | POA: Diagnosis not present

## 2020-01-29 DIAGNOSIS — Z125 Encounter for screening for malignant neoplasm of prostate: Secondary | ICD-10-CM | POA: Diagnosis not present

## 2020-01-29 DIAGNOSIS — E7849 Other hyperlipidemia: Secondary | ICD-10-CM | POA: Diagnosis not present

## 2020-01-29 DIAGNOSIS — E559 Vitamin D deficiency, unspecified: Secondary | ICD-10-CM | POA: Diagnosis not present

## 2020-01-31 DIAGNOSIS — I10 Essential (primary) hypertension: Secondary | ICD-10-CM | POA: Diagnosis not present

## 2020-01-31 DIAGNOSIS — R82998 Other abnormal findings in urine: Secondary | ICD-10-CM | POA: Diagnosis not present

## 2020-02-05 DIAGNOSIS — Z1331 Encounter for screening for depression: Secondary | ICD-10-CM | POA: Diagnosis not present

## 2020-02-05 DIAGNOSIS — G43909 Migraine, unspecified, not intractable, without status migrainosus: Secondary | ICD-10-CM | POA: Diagnosis not present

## 2020-02-05 DIAGNOSIS — I10 Essential (primary) hypertension: Secondary | ICD-10-CM | POA: Diagnosis not present

## 2020-02-05 DIAGNOSIS — E785 Hyperlipidemia, unspecified: Secondary | ICD-10-CM | POA: Diagnosis not present

## 2020-02-05 DIAGNOSIS — T17900A Unspecified foreign body in respiratory tract, part unspecified causing asphyxiation, initial encounter: Secondary | ICD-10-CM | POA: Diagnosis not present

## 2020-02-05 DIAGNOSIS — M1612 Unilateral primary osteoarthritis, left hip: Secondary | ICD-10-CM | POA: Diagnosis not present

## 2020-02-05 DIAGNOSIS — I714 Abdominal aortic aneurysm, without rupture: Secondary | ICD-10-CM | POA: Diagnosis not present

## 2020-02-05 DIAGNOSIS — Z0000 Encounter for general adult medical examination without abnormal findings: Secondary | ICD-10-CM | POA: Diagnosis not present

## 2020-02-05 DIAGNOSIS — M79676 Pain in unspecified toe(s): Secondary | ICD-10-CM | POA: Diagnosis not present

## 2020-02-05 DIAGNOSIS — T17908A Unspecified foreign body in respiratory tract, part unspecified causing other injury, initial encounter: Secondary | ICD-10-CM | POA: Insufficient documentation

## 2020-02-07 DIAGNOSIS — Z1212 Encounter for screening for malignant neoplasm of rectum: Secondary | ICD-10-CM | POA: Diagnosis not present

## 2020-02-08 ENCOUNTER — Other Ambulatory Visit (HOSPITAL_COMMUNITY): Payer: Self-pay | Admitting: *Deleted

## 2020-02-08 DIAGNOSIS — R059 Cough, unspecified: Secondary | ICD-10-CM

## 2020-02-08 DIAGNOSIS — R1310 Dysphagia, unspecified: Secondary | ICD-10-CM

## 2020-02-28 ENCOUNTER — Ambulatory Visit (HOSPITAL_COMMUNITY)
Admission: RE | Admit: 2020-02-28 | Discharge: 2020-02-28 | Disposition: A | Discharge status: Home / Self Care | Payer: Medicare Other | Source: Ambulatory Visit | Attending: Internal Medicine | Admitting: Internal Medicine

## 2020-02-28 ENCOUNTER — Other Ambulatory Visit: Payer: Self-pay

## 2020-02-28 DIAGNOSIS — R05 Cough: Secondary | ICD-10-CM | POA: Insufficient documentation

## 2020-02-28 DIAGNOSIS — R1310 Dysphagia, unspecified: Secondary | ICD-10-CM | POA: Insufficient documentation

## 2020-02-28 DIAGNOSIS — R059 Cough, unspecified: Secondary | ICD-10-CM

## 2020-06-05 ENCOUNTER — Other Ambulatory Visit: Payer: Self-pay

## 2020-06-05 DIAGNOSIS — I714 Abdominal aortic aneurysm, without rupture, unspecified: Secondary | ICD-10-CM

## 2020-06-20 ENCOUNTER — Encounter: Payer: Self-pay | Admitting: Vascular Surgery

## 2020-06-20 ENCOUNTER — Other Ambulatory Visit: Payer: Self-pay

## 2020-06-20 ENCOUNTER — Ambulatory Visit (INDEPENDENT_AMBULATORY_CARE_PROVIDER_SITE_OTHER): Payer: Medicare Other | Admitting: Vascular Surgery

## 2020-06-20 ENCOUNTER — Ambulatory Visit (HOSPITAL_COMMUNITY)
Admission: RE | Admit: 2020-06-20 | Discharge: 2020-06-20 | Disposition: A | Discharge status: Home / Self Care | Payer: Medicare Other | Source: Ambulatory Visit | Attending: Internal Medicine | Admitting: Internal Medicine

## 2020-06-20 VITALS — BP 148/88 | HR 70 | Temp 98.5°F | Resp 20 | Ht 71.0 in | Wt 208.0 lb

## 2020-06-20 DIAGNOSIS — I714 Abdominal aortic aneurysm, without rupture, unspecified: Secondary | ICD-10-CM

## 2020-06-20 NOTE — Progress Notes (Signed)
Patient name: Keith Hernandez MRN: [id number] DOB: Dec 14, 1950 Sex: male  HPI: Keith Hernandez is a 69 y.o. male, with known abdominal aortic aneurysm.  He has previously been seen by my partner Dr. Trula Slade dating back to 2016.  At that time the aneurysm was around 3 cm diameter.  He was last seen by our nurse practitioner September 2020.  At that time ultrasound suggested 4.1 cm abdominal aortic aneurysm.  He has not had any abdominal or back pain.  He remains very active.  Past Medical History:  Diagnosis Date  . AAA (abdominal aortic aneurysm) ([place])   . Arthritis   . Bulging of cervical intervertebral disc    C6-7  . Dupuytren's disease   . GERD (gastroesophageal reflux disease)    OTC meds pnr  . Hyperlipidemia   . Hypertension   . Migraines    Past Surgical History:  Procedure Laterality Date  . achilles reattachment    . FASCIOTOMY Left 10/11/2015   Procedure: LEFT HAND SUBTOTAL PALMAR FASCIOTOMY WITH REPAIR RECONSTRUCTION ;  Surgeon: Roseanne Kaufman, MD;  Location: [place] [facility];  Service: Orthopedics;  Laterality: Left;  . LESION DESTRUCTION Bilateral 07/29/2017   Procedure: EXCISION LIP LESION;  Surgeon: [name], DO;  Location: [place] [facility];  Service: Plastics;  Laterality: Bilateral;  . LIPOMA EXCISION Right 07/29/2017   Procedure: EXCISION BACK LIPOMA;  Surgeon: [name], DO;  Location: [facility];  Service: Plastics;  Laterality: Right;  . TOTAL HIP ARTHROPLASTY Left 09/30/2018   Procedure: LEFT TOTAL HIP ARTHROPLASTY ANTERIOR APPROACH;  Surgeon: Mcarthur Rossetti, MD;  [facility];  Service: Orthopedics;  Laterality: Left;    Family History  Problem Relation Age of Onset  . Arthritis Mother   . Heart disease Mother   . Heart attack Mother   . Asthma Father   . Asthma Paternal Grandfather     SOCIAL HISTORY: Social History   Socioeconomic History  . Marital status: Married    Spouse name:  Not on file  . Number of children: Not on file  . Years of education: Not on file  . Highest education level: Not on file  Occupational History  . Not on file  Tobacco Use  . Smoking status: Former Smoker    Packs/day: 1.00    Types: Cigarettes    Quit date: 07/15/1999    Years since quitting: 20.9  . Smokeless tobacco: Never Used  Vaping Use  . Vaping Use: Never used  Substance and Sexual Activity  . Alcohol use: Yes    Alcohol/week: 7.0 standard drinks    Types: 5 Glasses of wine, 2 Shots of liquor per week    Comment: social  . Drug use: No  . Sexual activity: Not on file  Other Topics Concern  . Not on file  Social History Narrative  . Not on file   Social Determinants of Health   Financial Resource Strain:   . Difficulty of Paying Living Expenses: Not on file  Food Insecurity:   . Worried About Charity fundraiser in the Last Year: Not on file  . Ran Out of Food in the Last Year: Not on file  Transportation Needs:   . Lack of Transportation (Medical): Not on file  . Lack of Transportation (Non-Medical): Not on file  Physical Activity:   . Days of Exercise per Week: Not on file  . Minutes of Exercise per Session: Not on file  Stress:   . Feeling of Stress : Not on file  Social Connections:   . Frequency of Communication with Friends and Family: Not on file  . Frequency of Social Gatherings with Friends and Family: Not on file  . Attends Religious Services: Not on file  . Active Member of Clubs or Organizations: Not on file  . Attends Archivist Meetings: Not on file  . Marital Status: Not on file  Intimate Partner Violence:   . Fear of Current or Ex-Partner: Not on file  . Emotionally Abused: Not on file  . Physically Abused: Not on file  . Sexually Abused: Not on file    No Known Allergies  Current Outpatient Medications  Medication Sig Dispense Refill  . aspirin EC 81 MG tablet Take 81 mg by mouth daily.    . Cholecalciferol (VITAMIN D3 PO)  Take 1 capsule by mouth daily.    . Multiple Vitamin (MULTIVITAMIN) tablet Take 1 tablet by mouth daily.    Marland Kitchen olmesartan (BENICAR) 40 MG tablet Take 20 mg by mouth 2 (two) times daily.     Marland Kitchen triamcinolone (NASACORT ALLERGY 24HR) 55 MCG/ACT AERO nasal inhaler Place 1 spray into the nose daily as needed (for congestion).     No current facility-administered medications for this visit.    ROS:   General:  No weight loss, Fever, chills  Cardiac: No recent episodes of chest pain/pressure, no shortness of breath at rest.  No shortness of breath with exertion.  Denies history of atrial fibrillation or irregular heartbeat  Vascular: No history of rest pain in feet.  No history of claudication.  No history of non-healing ulcer, No history of DVT   Pulmonary: No home oxygen, no productive cough, no hemoptysis,  No asthma or wheezing   Physical Examination  Vitals:   06/20/20 0825  BP: (!) 148/88  Pulse: 70  Resp: 20  Temp: 98.5 F (36.9 C)  SpO2: 96%  Weight: 208 lb (94.3 kg)  Height: 5\' 11"  (1.803 m)    Body mass index is 29.01 kg/m.  General:  Alert and oriented, no acute distress HEENT: Normal Neck: No JVD Cardiac: Regular Rate and Rhythm Abdomen: Soft, non-tender, non-distended, no mass Skin: No rash Extremity Pulses:  2+ radial, brachial, femoral, popliteal dorsalis pedis pulses bilaterally Musculoskeletal: No deformity or edema  Neurologic: Upper and lower extremity motor 5/5 and symmetric  DATA:   Patient had ultrasound of the abdominal aorta today.  I reviewed and interpreted the study.  Aneurysm diameter was 3.2 cm today.  This is similar to previous aortic ultrasound.  Most likely discrepancy from 1 year ago is related to technique.  ASSESSMENT: Small abdominal aortic aneurysm essentially no change since 2017.  Patient needs a follow-up aortic ultrasound in 1 year.   PLAN: Follow-up aortic ultrasound in 1 year and seen in our APP clinic.   Ruta Hinds,  MD [facility] Office: [phone]

## 2020-07-08 DIAGNOSIS — Z23 Encounter for immunization: Secondary | ICD-10-CM | POA: Diagnosis not present

## 2020-08-20 DIAGNOSIS — I10 Essential (primary) hypertension: Secondary | ICD-10-CM | POA: Diagnosis not present

## 2020-08-20 DIAGNOSIS — R002 Palpitations: Secondary | ICD-10-CM | POA: Diagnosis not present

## 2020-08-20 DIAGNOSIS — I499 Cardiac arrhythmia, unspecified: Secondary | ICD-10-CM | POA: Diagnosis not present

## 2020-09-18 NOTE — Progress Notes (Signed)
Cardiology Office Note:   Date:  09/19/2020  NAME:  Keith Hernandez    MRN: [id number] DOB:  Jan 07, 1951   PCP:  Velna Hatchet, MD  Cardiologist:  No primary care provider on file.   Referring MD: Velna Hatchet, MD   Chief Complaint  Patient presents with  . New Patient (Initial Visit)       . Irregular Heart Beat   History of Present Illness:   Keith Hernandez is a 70 y.o. male with a hx of AAA, HLD, HTN who is being seen today for the evaluation of irregular heart beat at the request of Velna Hatchet, MD.  He reports for the last 2 months has had episodes of skipped heartbeats.  They have occurred periodically.  He reports it occurred 2-3 times per week.  They have occurred also after alcohol use.  He reports that he had several glasses of wine on Thanksgiving and had episodes of intermittent palpitations for up to 1 to 2 hours.  Symptoms do not occur with exertion.  They seem to occur in the mornings or in the evenings.  Can be associated with food.  He describes no chest pain or shortness of breath.  He does exercise.  He walks up to 1 to 2 miles without any limitations.  He did receive the Pfizer vaccine roughly 3 weeks before his symptoms started.  This was the booster.  He is received 3 doses.  He is never had any rapid heartbeat sensation.  No syncope.  No lightheadedness or dizziness.  His EKG today demonstrates normal sinus rhythm with no acute ischemic changes.  No recent thyroid.  Blood pressure a bit elevated 140/94.  At home it is in the 130/80 range.  BMI is 29.  No concerns for sleep apnea.  He is a former smoker of 30 years.  He quit several years ago.  He drinks alcohol in moderation.  No illicit drug use is reported.  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  He does have a AAA.  Seems to be stable.  He takes an aspirin but is not taking a statin.  Apparently he had troubles in the past.  We discussed going back on a statin agent.  He is okay to try low-dose  Crestor.  Problem List 1. AAA -3.2 cm 2. HLD -Total cholesterol 177, HDL 49, LDL 108, triglycerides 101 3. HTN  Past Medical History: Past Medical History:  Diagnosis Date  . AAA (abdominal aortic aneurysm) ([place])   . Arthritis   . Bulging of cervical intervertebral disc    C6-7  . Dupuytren's disease   . GERD (gastroesophageal reflux disease)    OTC meds pnr  . Hyperlipidemia   . Hypertension   . Migraines     Past Surgical History: Past Surgical History:  Procedure Laterality Date  . achilles reattachment    . FASCIOTOMY Left 10/11/2015   Procedure: LEFT HAND SUBTOTAL PALMAR FASCIOTOMY WITH REPAIR RECONSTRUCTION ;  Surgeon: Roseanne Kaufman, MD;  Location: [facility];  Service: Orthopedics;  Laterality: Left;  . LESION DESTRUCTION Bilateral 07/29/2017   Procedure: EXCISION LIP LESION;  Surgeon: [name], DO;  Location: [facility];  Service: Plastics;  Laterality: Bilateral;  . LIPOMA EXCISION Right 07/29/2017   Procedure: EXCISION BACK LIPOMA;  Surgeon: [name], DO;  Location: [facility];  Service: Plastics;  Laterality: Right;  . TOTAL HIP ARTHROPLASTY Left 09/30/2018   Procedure: LEFT TOTAL  HIP ARTHROPLASTY ANTERIOR APPROACH;  Surgeon: Mcarthur Rossetti, MD;  Location: [facility];  Service: Orthopedics;  Laterality: Left;    Current Medications: Current Meds  Medication Sig  . aspirin EC 81 MG tablet Take 81 mg by mouth daily.  . Cholecalciferol (VITAMIN D3 PO) Take 1 capsule by mouth daily.  . Multiple Vitamin (MULTIVITAMIN) tablet Take 1 tablet by mouth daily.  Marland Kitchen olmesartan (BENICAR) 40 MG tablet Take 20 mg by mouth 2 (two) times daily.   . rosuvastatin (CRESTOR) 5 MG tablet Take 1 tablet (5 mg total) by mouth daily.  Marland Kitchen triamcinolone (NASACORT) 55 MCG/ACT AERO nasal inhaler Place 1 spray into the nose daily as needed (for congestion).     Allergies:    Patient has no known allergies.   Social  History: Social History   Socioeconomic History  . Marital status: Married    Spouse name: Not on file  . Number of children: 0  . Years of education: Not on file  . Highest education level: Not on file  Occupational History  . Occupation: retired  Tobacco Use  . Smoking status: Former Smoker    Packs/day: 1.00    Years: 30.00    Pack years: 30.00    Types: Cigarettes    Quit date: 07/15/1999    Years since quitting: 21.1  . Smokeless tobacco: Never Used  Vaping Use  . Vaping Use: Never used  Substance and Sexual Activity  . Alcohol use: Yes    Alcohol/week: 7.0 standard drinks    Types: 5 Glasses of wine, 2 Shots of liquor per week    Comment: social  . Drug use: No  . Sexual activity: Not on file  Other Topics Concern  . Not on file  Social History Narrative  . Not on file   Social Determinants of Health   Financial Resource Strain: Not on file  Food Insecurity: Not on file  Transportation Needs: Not on file  Physical Activity: Not on file  Stress: Not on file  Social Connections: Not on file     Family History: The patient's family history includes Arthritis in his mother; Asthma in his father and paternal grandfather; Heart attack in his mother; Heart disease in his mother.  ROS:   All other ROS reviewed and negative. Pertinent positives noted in the HPI.     EKGs/Labs/Other Studies Reviewed:   The following studies were personally reviewed by me today:  EKG:  EKG is ordered today.  The ekg ordered today demonstrates normal sinus rhythm heart rate 68, no acute ischemic changes, no evidence of prior infarction, and was personally reviewed by me.   Recent Labs: No results found for requested labs within last 8760 hours.   Recent Lipid Panel No results found for: CHOL, TRIG, HDL, CHOLHDL, VLDL, LDLCALC, LDLDIRECT  Physical Exam:   VS:  BP (!) 148/94 (BP Location: Left Arm, Patient Position: Sitting, Cuff Size: Normal)   Pulse 68   Ht 5\' 11"  (1.803 m)    Wt 210 lb (95.3 kg)   BMI 29.29 kg/m    Wt Readings from Last 3 Encounters:  09/19/20 210 lb (95.3 kg)  06/20/20 208 lb (94.3 kg)  06/14/19 207 lb 1.6 oz (93.9 kg)    General: Well nourished, well developed, in no acute distress Head: Atraumatic, normal size  Eyes: PEERLA, EOMI  Neck: Supple, no JVD Endocrine: No thryomegaly Cardiac: Normal S1, S2; RRR; no murmurs, rubs, or gallops Lungs: Clear to auscultation bilaterally, no  wheezing, rhonchi or rales  Abd: Soft, nontender, no hepatomegaly  Ext: No edema, pulses 2+ Musculoskeletal: No deformities, BUE and BLE strength normal and equal Skin: Warm and dry, no rashes   Neuro: Alert and oriented to person, place, time, and situation, CNII-XII grossly intact, no focal deficits  Psych: Normal mood and affect   ASSESSMENT:   Keith Hernandez is a 70 y.o. male who presents for the following: 1. Irregular heart beat   2. Palpitations   3. Mixed hyperlipidemia   4. AAA (abdominal aortic aneurysm) without rupture (HCC)   5. Primary hypertension     PLAN:   1. Irregular heart beat 2. Palpitations -Unclear what is going on.  EKG is normal today.  Cardiovascular examination is normal.  I recommended to check a TSH today.  I would also like to check a 7-day Zio patch to exclude arrhythmia.  He could be having A. fib episodes.  3. Mixed hyperlipidemia -He has a AAA.  He should continue aspirin and start Crestor 5 mg a day.  Most recent LDL cholesterol 108.  I also recommended coronary calcium scoring.  He is okay to do this.  4. AAA (abdominal aortic aneurysm) without rupture (HCC) -3.2 on most recent test.  Continue with yearly follow-up.  5. Primary hypertension -A bit elevated today.  Well controlled at home.  Continue to monitor.  Disposition: Return in about 3 months (around 12/18/2020).  Medication Adjustments/Labs and Tests Ordered: Current medicines are reviewed at length with the patient today.  Concerns regarding medicines are  outlined above.  Orders Placed This Encounter  Procedures  . TSH  . EKG 12-Lead   Meds ordered this encounter  Medications  . rosuvastatin (CRESTOR) 5 MG tablet    Sig: Take 1 tablet (5 mg total) by mouth daily.    Dispense:  90 tablet    Refill:  3    Patient Instructions  Medication Instructions:   START Crestor 5 mg daily  *If you need a refill on your cardiac medications before your next appointment, please call your pharmacy*  Lab Work: Your physician recommends that you return for lab work TODAY:   TSH  If you have labs (blood work) drawn today and your tests are completely normal, you will receive your results only by: Marland Kitchen MyChart Message (if you have MyChart) OR . A paper copy in the mail If you have any lab test that is abnormal or we need to change your treatment, we will call you to review the results.  Testing/Procedures: Christena Deem- Long Term Monitor Instructions   Your physician has requested you wear your ZIO patch monitor 7 days.   This is a single patch monitor.  [employer] supplies one patch monitor per enrollment.  Additional stickers are not available.   Please do not apply patch if you will be having a Nuclear Stress Test, Echocardiogram, Cardiac CT, MRI, or Chest Xray during the time frame you would be wearing the monitor. The patch cannot be worn during these tests.  You cannot remove and re-apply the ZIO XT patch monitor.   Your ZIO patch monitor will be sent USPS Priority mail from [facility] directly to your home address. The monitor may also be mailed to a PO BOX if home delivery is not available.   It may take 3-5 days to receive your monitor after you have been enrolled.   Once you have received you monitor, please review enclosed instructions.  Your monitor has already been  registered assigning a specific monitor serial # to you.   Applying the monitor   Shave hair from upper left chest.   Hold abrader disc by orange tab.  Rub abrader in  40 strokes over left upper chest as indicated in your monitor instructions.   Clean area with 4 enclosed alcohol pads .  Use all pads to assure are is cleaned thoroughly.  Let dry.   Apply patch as indicated in monitor instructions.  Patch will be place under collarbone on left side of chest with arrow pointing upward.   Rub patch adhesive wings for 2 minutes.Remove white label marked "1".  Remove white label marked "2".  Rub patch adhesive wings for 2 additional minutes.   While looking in a mirror, press and release button in center of patch.  A small green light will flash 3-4 times .  This will be your only indicator the monitor has been turned on.     Do not shower for the first 24 hours.  You may shower after the first 24 hours.   Press button if you feel a symptom. You will hear a small click.  Record Date, Time and Symptom in the Patient Log Book.   When you are ready to remove patch, follow instructions on last 2 pages of Patient Log Book.  Stick patch monitor onto last page of Patient Log Book.   Place Patient Log Book in [place] box.  Use locking tab on box and tape box closed securely.  The [employer] and [employer] has [employer] on it.  Please place in mailbox as soon as possible.  Your physician should have your test results approximately 7 days after the monitor has been mailed back to [facility].   Call [place] at [phone] if you have questions regarding your ZIO XT patch monitor.  Call them immediately if you see an orange light blinking on your monitor.   If your monitor falls off in less than 4 days contact our Monitor department at [phone].  If your monitor becomes loose or falls off after 4 days call [employer] at [phone] for suggestions on securing your monitor.    Follow-Up: At [facility], you and your health needs are our priority.  As part of our continuing mission to provide you with exceptional heart care, we have created  designated Provider Care Teams.  These Care Teams include your primary Cardiologist (physician) and Advanced Practice Providers (APPs -  Physician Assistants and Nurse Practitioners) who all work together to provide you with the care you need, when you need it.  Your next appointment:   3 month(s)  The format for your next appointment:   In Person  Provider:   Eleonore Chiquito, MD  Other Instructions      Signed, Addison Naegeli. Audie Box, [place]  [address] [place],  [address] [phone]  09/19/2020 1:43 PM

## 2020-09-19 ENCOUNTER — Ambulatory Visit (INDEPENDENT_AMBULATORY_CARE_PROVIDER_SITE_OTHER): Payer: Medicare Other | Admitting: Cardiovascular Disease

## 2020-09-19 ENCOUNTER — Encounter: Payer: Self-pay | Admitting: Cardiovascular Disease

## 2020-09-19 ENCOUNTER — Other Ambulatory Visit: Payer: Self-pay

## 2020-09-19 VITALS — BP 148/94 | HR 68 | Ht 71.0 in | Wt 210.0 lb

## 2020-09-19 DIAGNOSIS — R002 Palpitations: Secondary | ICD-10-CM | POA: Diagnosis not present

## 2020-09-19 DIAGNOSIS — E782 Mixed hyperlipidemia: Secondary | ICD-10-CM

## 2020-09-19 DIAGNOSIS — R072 Precordial pain: Secondary | ICD-10-CM

## 2020-09-19 DIAGNOSIS — I10 Essential (primary) hypertension: Secondary | ICD-10-CM

## 2020-09-19 DIAGNOSIS — I499 Cardiac arrhythmia, unspecified: Secondary | ICD-10-CM | POA: Diagnosis not present

## 2020-09-19 DIAGNOSIS — I714 Abdominal aortic aneurysm, without rupture, unspecified: Secondary | ICD-10-CM

## 2020-09-19 MED ORDER — ROSUVASTATIN CALCIUM 5 MG PO TABS: 5.0000 mg | ORAL_TABLET | Freq: Every day | ORAL | 3 refills | Status: DC

## 2020-09-19 NOTE — Patient Instructions (Addendum)
Medication Instructions:   START Crestor 5 mg daily  *If you need a refill on your cardiac medications before your next appointment, please call your pharmacy*  Lab Work: Your physician recommends that you return for lab work TODAY:   TSH  If you have labs (blood work) drawn today and your tests are completely normal, you will receive your results only by: Marland Kitchen MyChart Message (if you have MyChart) OR . A paper copy in the mail If you have any lab test that is abnormal or we need to change your treatment, we will call you to review the results.  Testing/Procedures:  Dr. Audie Box has ordered a CT coronary calcium score. This test is done at [address]. This is $99 out of pocket.   Coronary CalciumScan A coronary calcium scan is an imaging test used to look for deposits of calcium and other fatty materials (plaques) in the inner lining of the blood vessels of the heart (coronary arteries). These deposits of calcium and plaques can partly clog and narrow the coronary arteries without producing any symptoms or warning signs. This puts a person at risk for a heart attack. This test can detect these deposits before symptoms develop. Tell a health care provider about:  Any allergies you have.  All medicines you are taking, including vitamins, herbs, eye drops, creams, and over-the-counter medicines.  Any problems you or family members have had with anesthetic medicines.  Any blood disorders you have.  Any surgeries you have had.  Any medical conditions you have.  Whether you are pregnant or may be pregnant. What are the risks? Generally, this is a safe procedure. However, problems may occur, including:  Harm to a pregnant woman and her unborn baby. This test involves the use of radiation. Radiation exposure can be dangerous to a pregnant woman and her unborn baby. If you are pregnant, you generally should not have this procedure done.  Slight increase in the risk  of cancer. This is because of the radiation involved in the test. What happens before the procedure? No preparation is needed for this procedure. What happens during the procedure?  You will undress and remove any jewelry around your neck or chest.  You will put on a hospital gown.  Sticky electrodes will be placed on your chest. The electrodes will be connected to an electrocardiogram (ECG) machine to record a tracing of the electrical activity of your heart.  A CT scanner will take pictures of your heart. During this time, you will be asked to lie still and hold your breath for 2-3 seconds while a picture of your heart is being taken. The procedure may vary among health care providers and hospitals. What happens after the procedure?  You can get dressed.  You can return to your normal activities.  It is up to you to get the results of your test. Ask your health care provider, or the department that is doing the test, when your results will be ready. Summary  A coronary calcium scan is an imaging test used to look for deposits of calcium and other fatty materials (plaques) in the inner lining of the blood vessels of the heart (coronary arteries).  Generally, this is a safe procedure. Tell your health care provider if you are pregnant or may be pregnant.  No preparation is needed for this procedure.  A CT scanner will take pictures of your heart.  You can return to your normal activities after the scan is  done. This information is not intended to replace advice given to you by your health care provider. Make sure you discuss any questions you have with your health care provider. Document Released: 02/27/2008 Document Revised: 07/20/2016 Document Reviewed: 07/20/2016 Elsevier Interactive Patient Education  2017 [place] Term Monitor Instructions   Your physician has requested you wear your ZIO patch monitor 7 days.   This is a single patch monitor.  [employer]  supplies one patch monitor per enrollment.  Additional stickers are not available.   Please do not apply patch if you will be having a Nuclear Stress Test, Echocardiogram, Cardiac CT, MRI, or Chest Xray during the time frame you would be wearing the monitor. The patch cannot be worn during these tests.  You cannot remove and re-apply the ZIO XT patch monitor.   Your ZIO patch monitor will be sent USPS Priority mail from [facility] directly to your home address. The monitor may also be mailed to a PO BOX if home delivery is not available.   It may take 3-5 days to receive your monitor after you have been enrolled.   Once you have received you monitor, please review enclosed instructions.  Your monitor has already been registered assigning a specific monitor serial # to you.   Applying the monitor   Shave hair from upper left chest.   Hold abrader disc by orange tab.  Rub abrader in 40 strokes over left upper chest as indicated in your monitor instructions.   Clean area with 4 enclosed alcohol pads .  Use all pads to assure are is cleaned thoroughly.  Let dry.   Apply patch as indicated in monitor instructions.  Patch will be place under collarbone on left side of chest with arrow pointing upward.   Rub patch adhesive wings for 2 minutes.Remove white label marked "1".  Remove white label marked "2".  Rub patch adhesive wings for 2 additional minutes.   While looking in a mirror, press and release button in center of patch.  A small green light will flash 3-4 times .  This will be your only indicator the monitor has been turned on.     Do not shower for the first 24 hours.  You may shower after the first 24 hours.   Press button if you feel a symptom. You will hear a small click.  Record Date, Time and Symptom in the Patient Log Book.   When you are ready to remove patch, follow instructions on last 2 pages of Patient Log Book.  Stick patch monitor onto last page of Patient Log  Book.   Place Patient Log Book in [place] box.  Use locking tab on box and tape box closed securely.  The [employer] has [employer] on it.  Please place in mailbox as soon as possible.  Your physician should have your test results approximately 7 days after the monitor has been mailed back to [facility].   Call [place] at [phone] if you have questions regarding your ZIO XT patch monitor.  Call them immediately if you see an orange light blinking on your monitor.   If your monitor falls off in less than 4 days contact our Monitor department at [phone].  If your monitor becomes loose or falls off after 4 days call [employer] at [phone] for suggestions on securing your monitor.    Follow-Up: At [facility], you and your health needs are our priority.  As part of our continuing mission to provide you with exceptional heart care, we have created designated Provider Care Teams.  These Care Teams include your primary Cardiologist (physician) and Advanced Practice Providers (APPs -  Physician Assistants and Nurse Practitioners) who all work together to provide you with the care you need, when you need it.  Your next appointment:   3 month(s)  The format for your next appointment:   In Person  Provider:   Lennie Odor, MD  Other Instructions

## 2020-09-19 NOTE — Addendum Note (Signed)
Addended by: Dorris Fetch on: 09/19/2020 01:59 PM   Modules accepted: Orders

## 2020-09-20 LAB — TSH: TSH: 1.47 u[IU]/mL (ref 0.450–4.500)

## 2020-09-24 DIAGNOSIS — H2513 Age-related nuclear cataract, bilateral: Secondary | ICD-10-CM | POA: Diagnosis not present

## 2020-09-24 DIAGNOSIS — H43813 Vitreous degeneration, bilateral: Secondary | ICD-10-CM | POA: Diagnosis not present

## 2020-09-26 ENCOUNTER — Ambulatory Visit (INDEPENDENT_AMBULATORY_CARE_PROVIDER_SITE_OTHER)
Admission: RE | Admit: 2020-09-26 | Discharge: 2020-09-26 | Disposition: A | Discharge status: Home / Self Care | Payer: Self-pay | Source: Ambulatory Visit | Attending: Cardiovascular Disease | Admitting: Cardiovascular Disease

## 2020-09-26 ENCOUNTER — Other Ambulatory Visit: Payer: Self-pay

## 2020-09-26 DIAGNOSIS — I10 Essential (primary) hypertension: Secondary | ICD-10-CM

## 2020-09-26 DIAGNOSIS — R072 Precordial pain: Secondary | ICD-10-CM

## 2020-09-27 ENCOUNTER — Ambulatory Visit (INDEPENDENT_AMBULATORY_CARE_PROVIDER_SITE_OTHER): Payer: Medicare Other

## 2020-09-27 DIAGNOSIS — R002 Palpitations: Secondary | ICD-10-CM

## 2020-09-27 DIAGNOSIS — I499 Cardiac arrhythmia, unspecified: Secondary | ICD-10-CM

## 2020-09-27 NOTE — Addendum Note (Signed)
Addended by: Patton Salles E on: 09/27/2020 11:18 AM   Modules accepted: Orders

## 2020-10-04 DIAGNOSIS — I499 Cardiac arrhythmia, unspecified: Secondary | ICD-10-CM

## 2020-10-04 DIAGNOSIS — R002 Palpitations: Secondary | ICD-10-CM | POA: Diagnosis not present

## 2020-10-15 DIAGNOSIS — I499 Cardiac arrhythmia, unspecified: Secondary | ICD-10-CM | POA: Diagnosis not present

## 2020-10-15 DIAGNOSIS — R002 Palpitations: Secondary | ICD-10-CM | POA: Diagnosis not present

## 2020-12-22 NOTE — Progress Notes (Signed)
Cardiology Office Note:   Date:  12/24/2020  NAME:  Keith Hernandez    MRN: [id number] DOB:  1950-12-25   PCP:  Velna Hatchet, MD  Cardiologist:  No primary care provider on file.  Electrophysiologist:  None   Referring MD: Velna Hatchet, MD   Chief Complaint  Patient presents with  . Palpitations   History of Present Illness:   Keith Hernandez is a 70 y.o. male with a hx of AAA, HLD, HTN who presents for follow-up. Seen for palpitations and found to have brief atrial tachycardia episodes.  He opted for conservative management.  He reports he is still having episodes.  They can occur roughly 1 time per week.  Can last seconds.  Described as a fluttering sensation in his chest.  He has had more episodes over the last 1 month.  Apparently has had poor sleep.  He does snore but no concerns for sleep apnea at this time.  BP at home is well controlled between 130 to 120 mmHg.  BP slightly elevated today.  Apparently he does have a bit of whitecoat hypertension.  He is tolerating Crestor 5 mg daily.  He has a AAA.  He is on aspirin as well.  He will have labs later this year by his primary care physician.  He is walking 2 miles 3 to 4 days/week.  No chest pain or shortness of breath.  He does have limitations with arthritis but no major symptoms.  Overall appears to be doing well.  No increased caffeine consumption.  No significant symptoms of chest pain or shortness of breath.  Problem List 1. AAA -3.2 cm 2. HLD -Total cholesterol 177, HDL 49, LDL 108, triglycerides 101 3. HTN 4. Ectopic atrial tachycardia -brief episodes   Past Medical History: Past Medical History:  Diagnosis Date  . AAA (abdominal aortic aneurysm) ([place])   . Arthritis   . Bulging of cervical intervertebral disc    C6-7  . Dupuytren's disease   . GERD (gastroesophageal reflux disease)    OTC meds pnr  . Hyperlipidemia   . Hypertension   . Migraines     Past Surgical History: Past Surgical History:  Procedure  Laterality Date  . achilles reattachment    . FASCIOTOMY Left 10/11/2015   Procedure: LEFT HAND SUBTOTAL PALMAR FASCIOTOMY WITH REPAIR RECONSTRUCTION ;  Surgeon: Roseanne Kaufman, MD;  Location: [facility];  Service: Orthopedics;  Laterality: Left;  . LESION DESTRUCTION Bilateral 07/29/2017   Procedure: EXCISION LIP LESION;  Surgeon: [name], DO;  Location: [facility];  Service: Plastics;  Laterality: Bilateral;  . LIPOMA EXCISION Right 07/29/2017   Procedure: EXCISION BACK LIPOMA;  Surgeon: [name], DO;  Location: [facility];  Service: Plastics;  Laterality: Right;  . TOTAL HIP ARTHROPLASTY Left 09/30/2018   Procedure: LEFT TOTAL HIP ARTHROPLASTY ANTERIOR APPROACH;  Surgeon: Mcarthur Rossetti, MD;  Location: [facility];  Service: Orthopedics;  Laterality: Left;    Current Medications: Current Meds  Medication Sig  . aspirin EC 81 MG tablet Take 81 mg by mouth daily.  . Cholecalciferol (VITAMIN D3 PO) Take 1 capsule by mouth daily.  . Multiple Vitamin (MULTIVITAMIN) tablet Take 1 tablet by mouth daily.  Marland Kitchen olmesartan (BENICAR) 40 MG tablet Take 20 mg by mouth 2 (two) times daily.   . rosuvastatin (CRESTOR) 5 MG tablet Take 1 tablet (5 mg total) by mouth daily.  Marland Kitchen triamcinolone (NASACORT) 55 MCG/ACT AERO nasal inhaler  Place 1 spray into the nose daily as needed (for congestion).     Allergies:    Patient has no known allergies.   Social History: Social History   Socioeconomic History  . Marital status: Married    Spouse name: Not on file  . Number of children: 0  . Years of education: Not on file  . Highest education level: Not on file  Occupational History  . Occupation: retired  Tobacco Use  . Smoking status: Former Smoker    Packs/day: 1.00    Years: 30.00    Pack years: 30.00    Types: Cigarettes    Quit date: 07/15/1999    Years since quitting: 21.4  . Smokeless tobacco: Never Used  Vaping Use  .  Vaping Use: Never used  Substance and Sexual Activity  . Alcohol use: Yes    Alcohol/week: 7.0 standard drinks    Types: 5 Glasses of wine, 2 Shots of liquor per week    Comment: social  . Drug use: No  . Sexual activity: Not on file  Other Topics Concern  . Not on file  Social History Narrative  . Not on file   Social Determinants of Health   Financial Resource Strain: Not on file  Food Insecurity: Not on file  Transportation Needs: Not on file  Physical Activity: Not on file  Stress: Not on file  Social Connections: Not on file     Family History: The patient's family history includes Arthritis in his mother; Asthma in his father and paternal grandfather; Heart attack in his mother; Heart disease in his mother.  ROS:   All other ROS reviewed and negative. Pertinent positives noted in the HPI.     EKGs/Labs/Other Studies Reviewed:   The following studies were personally reviewed by me today:  [facility] 10/20/2020 Impression: 1. Brief ectopic atrial tachycardia detected (4 episodes in 7 days; longest episode 6.8 seconds).  2. Rare ectopy.    Recent Labs: 09/19/2020: TSH 1.470   Recent Lipid Panel No results found for: CHOL, TRIG, HDL, CHOLHDL, VLDL, LDLCALC, LDLDIRECT  Physical Exam:   VS:  BP (!) 152/94 (BP Location: Left Arm, Patient Position: Sitting)   Pulse 82   Ht 5' 11.5" (1.816 m)   Wt 212 lb 12.8 oz (96.5 kg)   SpO2 96%   BMI 29.27 kg/m    Wt Readings from Last 3 Encounters:  12/24/20 212 lb 12.8 oz (96.5 kg)  09/19/20 210 lb (95.3 kg)  06/20/20 208 lb (94.3 kg)    General: Well nourished, well developed, in no acute distress Head: Atraumatic, normal size  Eyes: PEERLA, EOMI  Neck: Supple, no JVD Endocrine: No thryomegaly Cardiac: Normal S1, S2; RRR; no murmurs, rubs, or gallops Lungs: Clear to auscultation bilaterally, no wheezing, rhonchi or rales  Abd: Soft, nontender, no hepatomegaly  Ext: No edema, pulses 2+ Musculoskeletal: No deformities, BUE  and BLE strength normal and equal Skin: Warm and dry, no rashes   Neuro: Alert and oriented to person, place, time, and situation, CNII-XII grossly intact, no focal deficits  Psych: Normal mood and affect   ASSESSMENT:   Keith Hernandez is a 70 y.o. male who presents for the following: 1. Atrial tachycardia ([place])   2. Mixed hyperlipidemia   3. Primary hypertension   4. AAA (abdominal aortic aneurysm) without rupture ([place])     PLAN:   1. Atrial tachycardia ([facility]) -Brief atrial tachycardia episodes captured on monitor.  Still having symptoms.  He is  opted for conservative management.  He is having poor sleep.  He reports he is not tired during the day no early morning headaches but we will keep an eye on the possibility of sleep apnea.  If things continue to worsen he may need a home sleep study. -He wishes to hold on beta-blocker therapy.  I think this is reasonable.  He should continue with diet and exercise.  This will help.  He should also focus on good sleep.  2. Mixed hyperlipidemia -He is tolerating Crestor 5 mg daily well.  He has a AAA.  He will continue aspirin as well.  He will have follow-up labs later this year by his primary care physician.  He will forward [place] the results.  3. Primary hypertension -Blood pressure well controlled.  Continue current medications.  Is slightly elevated in the office today but this is likely related to whitecoat.  4. AAA (abdominal aortic aneurysm) without rupture ([place]) -AAA will be monitored by vascular surgery.  We will follow this yearly.  Most recent value 3.2 cm.  Continue aspirin and statin.   Disposition: Return in about 1 year (around 12/24/2021).  Medication Adjustments/Labs and Tests Ordered: Current medicines are reviewed at length with the patient today.  Concerns regarding medicines are outlined above.  No orders of the defined types were placed in this encounter.  No orders of the defined types were placed in this  encounter.   Patient Instructions  Medication Instructions:  The current medical regimen is effective;  continue present plan and medications.  *If you need a refill on your cardiac medications before your next appointment, please call your pharmacy*   Follow-Up: At [facility], you and your health needs are our priority.  As part of our continuing mission to provide you with exceptional heart care, we have created designated Provider Care Teams.  These Care Teams include your primary Cardiologist (physician) and Advanced Practice Providers (APPs -  Physician Assistants and Nurse Practitioners) who all work together to provide you with the care you need, when you need it.  We recommend signing up for the patient portal called "MyChart".  Sign up information is provided on this After Visit Summary.  MyChart is used to connect with patients for Virtual Visits (Telemedicine).  Patients are able to view lab/test results, encounter notes, upcoming appointments, etc.  Non-urgent messages can be sent to your provider as well.   To learn more about what you can do with MyChart, go to [email or web address].    Your next appointment:   12 month(s)  The format for your next appointment:   In Person  Provider:   Eleonore Chiquito, MD   Other Instructions [place]      Time Spent with Patient: I have spent a total of 25 minutes with patient reviewing hospital notes, telemetry, EKGs, labs and examining the patient as well as establishing an assessment and plan that was discussed with the patient.  > 50% of time was spent in direct patient care.  Signed, Addison Naegeli. Audie Box, MD, [place]  [address] [place], [place] [address] [phone]  12/24/2020 9:33 AM

## 2020-12-24 ENCOUNTER — Other Ambulatory Visit: Payer: Self-pay

## 2020-12-24 ENCOUNTER — Ambulatory Visit (INDEPENDENT_AMBULATORY_CARE_PROVIDER_SITE_OTHER): Payer: Medicare Other | Admitting: Cardiovascular Disease

## 2020-12-24 ENCOUNTER — Encounter: Payer: Self-pay | Admitting: Cardiovascular Disease

## 2020-12-24 VITALS — BP 152/94 | HR 82 | Ht 71.5 in | Wt 212.8 lb

## 2020-12-24 DIAGNOSIS — I714 Abdominal aortic aneurysm, without rupture, unspecified: Secondary | ICD-10-CM

## 2020-12-24 DIAGNOSIS — I10 Essential (primary) hypertension: Secondary | ICD-10-CM

## 2020-12-24 DIAGNOSIS — E782 Mixed hyperlipidemia: Secondary | ICD-10-CM | POA: Diagnosis not present

## 2020-12-24 DIAGNOSIS — I471 Supraventricular tachycardia: Secondary | ICD-10-CM

## 2020-12-24 NOTE — Patient Instructions (Signed)
Medication Instructions:  The current medical regimen is effective;  continue present plan and medications.  *If you need a refill on your cardiac medications before your next appointment, please call your pharmacy*   Follow-Up: At [facility], you and your health needs are our priority.  As part of our continuing mission to provide you with exceptional heart care, we have created designated Provider Care Teams.  These Care Teams include your primary Cardiologist (physician) and Advanced Practice Providers (APPs -  Physician Assistants and Nurse Practitioners) who all work together to provide you with the care you need, when you need it.  We recommend signing up for the patient portal called "MyChart".  Sign up information is provided on this After Visit Summary.  MyChart is used to connect with patients for Virtual Visits (Telemedicine).  Patients are able to view lab/test results, encounter notes, upcoming appointments, etc.  Non-urgent messages can be sent to your provider as well.   To learn more about what you can do with MyChart, go to [email or web address].    Your next appointment:   12 month(s)  The format for your next appointment:   In Person  Provider:   Eleonore Chiquito, MD   Other Instructions [facility] Device

## 2020-12-31 DIAGNOSIS — L814 Other melanin hyperpigmentation: Secondary | ICD-10-CM | POA: Diagnosis not present

## 2020-12-31 DIAGNOSIS — D225 Melanocytic nevi of trunk: Secondary | ICD-10-CM | POA: Diagnosis not present

## 2020-12-31 DIAGNOSIS — D1801 Hemangioma of skin and subcutaneous tissue: Secondary | ICD-10-CM | POA: Diagnosis not present

## 2020-12-31 DIAGNOSIS — L918 Other hypertrophic disorders of the skin: Secondary | ICD-10-CM | POA: Diagnosis not present

## 2020-12-31 DIAGNOSIS — L821 Other seborrheic keratosis: Secondary | ICD-10-CM | POA: Diagnosis not present

## 2020-12-31 DIAGNOSIS — D2272 Melanocytic nevi of left lower limb, including hip: Secondary | ICD-10-CM | POA: Diagnosis not present

## 2020-12-31 DIAGNOSIS — D2362 Other benign neoplasm of skin of left upper limb, including shoulder: Secondary | ICD-10-CM | POA: Diagnosis not present

## 2021-02-19 DIAGNOSIS — E559 Vitamin D deficiency, unspecified: Secondary | ICD-10-CM | POA: Diagnosis not present

## 2021-02-19 DIAGNOSIS — E785 Hyperlipidemia, unspecified: Secondary | ICD-10-CM | POA: Diagnosis not present

## 2021-02-19 DIAGNOSIS — Z0000 Encounter for general adult medical examination without abnormal findings: Secondary | ICD-10-CM | POA: Diagnosis not present

## 2021-02-19 DIAGNOSIS — Z125 Encounter for screening for malignant neoplasm of prostate: Secondary | ICD-10-CM | POA: Diagnosis not present

## 2021-02-24 DIAGNOSIS — Z0000 Encounter for general adult medical examination without abnormal findings: Secondary | ICD-10-CM | POA: Diagnosis not present

## 2021-02-24 DIAGNOSIS — I714 Abdominal aortic aneurysm, without rupture: Secondary | ICD-10-CM | POA: Diagnosis not present

## 2021-02-24 DIAGNOSIS — Z1389 Encounter for screening for other disorder: Secondary | ICD-10-CM | POA: Diagnosis not present

## 2021-02-24 DIAGNOSIS — E785 Hyperlipidemia, unspecified: Secondary | ICD-10-CM | POA: Diagnosis not present

## 2021-02-24 DIAGNOSIS — I471 Supraventricular tachycardia: Secondary | ICD-10-CM | POA: Diagnosis not present

## 2021-02-24 DIAGNOSIS — K219 Gastro-esophageal reflux disease without esophagitis: Secondary | ICD-10-CM | POA: Diagnosis not present

## 2021-02-24 DIAGNOSIS — Z1212 Encounter for screening for malignant neoplasm of rectum: Secondary | ICD-10-CM | POA: Diagnosis not present

## 2021-02-24 DIAGNOSIS — I10 Essential (primary) hypertension: Secondary | ICD-10-CM | POA: Diagnosis not present

## 2021-02-24 DIAGNOSIS — I2584 Coronary atherosclerosis due to calcified coronary lesion: Secondary | ICD-10-CM | POA: Diagnosis not present

## 2021-02-24 DIAGNOSIS — Z1331 Encounter for screening for depression: Secondary | ICD-10-CM | POA: Diagnosis not present

## 2021-02-24 DIAGNOSIS — E559 Vitamin D deficiency, unspecified: Secondary | ICD-10-CM | POA: Diagnosis not present

## 2021-02-24 DIAGNOSIS — R82998 Other abnormal findings in urine: Secondary | ICD-10-CM | POA: Diagnosis not present

## 2021-05-28 DIAGNOSIS — C44722 Squamous cell carcinoma of skin of right lower limb, including hip: Secondary | ICD-10-CM | POA: Diagnosis not present

## 2021-06-05 ENCOUNTER — Telehealth: Payer: Self-pay | Admitting: Cardiovascular Disease

## 2021-06-05 NOTE — Telephone Encounter (Signed)
Pt is reaching out wanting to know how to send EKG over to Dr. Marisue Ivan please advise

## 2021-06-05 NOTE — Telephone Encounter (Signed)
Pt state he was calling to see how he could upload EKG from [facility] mobile but has since figured out he can upload attachment via mychart. Pt state he had a few episodes of tachycardia and one reading the interpret possible afib. He state his wife is a previous ICU nurse and doesn't believe it was true afib but he would like Dr. Marisue Ivan to review. He did report slight dizziness during episodes.  Will forward to MD to make aware.

## 2021-06-13 DIAGNOSIS — Z23 Encounter for immunization: Secondary | ICD-10-CM | POA: Diagnosis not present

## 2021-07-17 DIAGNOSIS — Z23 Encounter for immunization: Secondary | ICD-10-CM | POA: Diagnosis not present

## 2021-07-21 ENCOUNTER — Other Ambulatory Visit: Payer: Self-pay

## 2021-07-21 DIAGNOSIS — I714 Abdominal aortic aneurysm, without rupture, unspecified: Secondary | ICD-10-CM

## 2021-08-04 ENCOUNTER — Ambulatory Visit: Payer: Medicare Other | Admitting: Surgery

## 2021-08-04 ENCOUNTER — Other Ambulatory Visit (HOSPITAL_COMMUNITY): Payer: Medicare Other

## 2021-08-11 ENCOUNTER — Ambulatory Visit (INDEPENDENT_AMBULATORY_CARE_PROVIDER_SITE_OTHER): Payer: Medicare Other | Admitting: Surgery

## 2021-08-11 ENCOUNTER — Ambulatory Visit (HOSPITAL_COMMUNITY)
Admission: RE | Admit: 2021-08-11 | Discharge: 2021-08-11 | Disposition: A | Discharge status: Home / Self Care | Payer: Medicare Other | Source: Ambulatory Visit | Attending: Surgery | Admitting: Surgery

## 2021-08-11 ENCOUNTER — Encounter: Payer: Self-pay | Admitting: Surgery

## 2021-08-11 ENCOUNTER — Other Ambulatory Visit: Payer: Self-pay

## 2021-08-11 VITALS — BP 150/90 | HR 67 | Temp 98.3°F | Resp 20 | Ht 71.5 in | Wt 212.0 lb

## 2021-08-11 DIAGNOSIS — I714 Abdominal aortic aneurysm, without rupture, unspecified: Secondary | ICD-10-CM

## 2021-08-11 DIAGNOSIS — I7143 Infrarenal abdominal aortic aneurysm, without rupture: Secondary | ICD-10-CM | POA: Diagnosis not present

## 2021-08-11 NOTE — Progress Notes (Signed)
[facility] [place]  Patient name: Keith Hernandez MRN: [id number] DOB: August 20, 1951 Sex: male   REASON FOR VISIT:    Follow up  HISOTRY OF PRESENT ILLNESS:    Keith Hernandez is a 70 y.o. male who last saw Dr. Oneida Alar in 2021 for an abdominal aortic aneurysm.  I had previously seen him in 2017 for small aneurysm.  He denies any back pain or abdominal pain.  He is medically managed for hypertension with an ARB.  He takes a statin for hypercholesterolemia 3 times a week.  He is a former smoker. PAST MEDICAL HISTORY:   Past Medical History:  Diagnosis Date   AAA (abdominal aortic aneurysm)    Arthritis    Bulging of cervical intervertebral disc    C6-7   Dupuytren's disease    GERD (gastroesophageal reflux disease)    OTC meds pnr   Hyperlipidemia    Hypertension    Migraines      FAMILY HISTORY:   Family History  Problem Relation Age of Onset   Arthritis Mother    Heart disease Mother    Heart attack Mother    Asthma Father    Asthma Paternal Grandfather     SOCIAL HISTORY:   Social History   Tobacco Use   Smoking status: Former    Packs/day: 1.00    Years: 30.00    Pack years: 30.00    Types: Cigarettes    Quit date: 07/15/1999    Years since quitting: 22.0   Smokeless tobacco: Never  Substance Use Topics   Alcohol use: Yes    Alcohol/week: 7.0 standard drinks    Types: 5 Glasses of wine, 2 Shots of liquor per week    Comment: social     ALLERGIES:   No Known Allergies   CURRENT MEDICATIONS:   Current Outpatient Medications  Medication Sig Dispense Refill   aspirin EC 81 MG tablet Take 81 mg by mouth daily.     Cholecalciferol (VITAMIN D3 PO) Take 1 capsule by mouth daily.     Multiple Vitamin (MULTIVITAMIN) tablet Take 1 tablet by mouth daily.     olmesartan (BENICAR) 40 MG tablet Take 20 mg by mouth 2 (two) times daily.      rosuvastatin (CRESTOR) 5 MG tablet Take 1 tablet (5 mg total)  by mouth daily. 90 tablet 3   triamcinolone (NASACORT) 55 MCG/ACT AERO nasal inhaler Place 1 spray into the nose daily as needed (for congestion).     No current facility-administered medications for this visit.    REVIEW OF SYSTEMS:   [X]  denotes positive finding, [ ]  denotes negative finding Cardiac  Comments:  Chest pain or chest pressure:    Shortness of breath upon exertion:    Short of breath when lying flat:    Irregular heart rhythm:        Vascular    Pain in calf, thigh, or hip brought on by ambulation:    Pain in feet at night that wakes you up from your sleep:     Blood clot in your veins:    Leg swelling:         Pulmonary    Oxygen at home:    Productive cough:     Wheezing:         Neurologic    Sudden weakness in arms or legs:     Sudden numbness in arms or legs:     Sudden onset of difficulty speaking  or slurred speech:    Temporary loss of vision in one eye:     Problems with dizziness:         Gastrointestinal    Blood in stool:     Vomited blood:         Genitourinary    Burning when urinating:     Blood in urine:        Psychiatric    Major depression:         Hematologic    Bleeding problems:    Problems with blood clotting too easily:        Skin    Rashes or ulcers:        Constitutional    Fever or chills:      PHYSICAL EXAM:   There were no vitals filed for this visit.  GENERAL: The patient is a well-nourished male, in no acute distress. The vital signs are documented above. CARDIAC: There is a regular rate and rhythm.  VASCULAR: Extremities are warm and well-perfused PULMONARY: Non-labored respirations ABDOMEN: Soft and non-tender MUSCULOSKELETAL: There are no major deformities or cyanosis. NEUROLOGIC: No focal weakness or paresthesias are detected. SKIN: There are no ulcers or rashes noted. PSYCHIATRIC: The patient has a normal affect.  STUDIES:   I have reviewed his ultrasound today with the following  results:  Abdominal Aorta Findings:  +-----------+-------+----------+----------+--------+--------+--------+  Location   AP (cm)Trans (cm)PSV (cm/s)WaveformThrombusComments  +-----------+-------+----------+----------+--------+--------+--------+  Proximal   2.73   2.52      32                                  +-----------+-------+----------+----------+--------+--------+--------+  Mid        1.97   2.13      69                                  +-----------+-------+----------+----------+--------+--------+--------+  Distal     3.50   3.42      62                                  +-----------+-------+----------+----------+--------+--------+--------+  RT CIA Prox1.8    1.5       112                                 +-----------+-------+----------+----------+--------+--------+--------+  LT CIA Prox1.7              85                                  +-----------+-------+----------+----------+--------+--------+--------+   MEDICAL ISSUES:   Abdominal aortic aneurysm: Maximum aortic diameter today was 4.1 cm although it is reported at 3.5.  I suspect there is been minimal change however the patient's aorta is tortuous and so I do not think that we are getting accurate ultrasound results.  Therefore I will bring him back in 1 year, this time with a CT scan to confirm the diameter of his aorta    Annamarie Major, IV, MD, FACS [facility] [phone] Pager [phone]

## 2021-08-12 DIAGNOSIS — Z85828 Personal history of other malignant neoplasm of skin: Secondary | ICD-10-CM | POA: Diagnosis not present

## 2021-08-12 DIAGNOSIS — L98499 Non-pressure chronic ulcer of skin of other sites with unspecified severity: Secondary | ICD-10-CM | POA: Diagnosis not present

## 2021-09-04 ENCOUNTER — Other Ambulatory Visit: Payer: Self-pay | Admitting: Cardiovascular Disease

## 2021-09-04 DIAGNOSIS — E782 Mixed hyperlipidemia: Secondary | ICD-10-CM

## 2021-09-23 ENCOUNTER — Ambulatory Visit (INDEPENDENT_AMBULATORY_CARE_PROVIDER_SITE_OTHER): Payer: Medicare Other

## 2021-09-23 ENCOUNTER — Other Ambulatory Visit: Payer: Self-pay

## 2021-09-23 ENCOUNTER — Ambulatory Visit (INDEPENDENT_AMBULATORY_CARE_PROVIDER_SITE_OTHER): Payer: Medicare Other | Admitting: Orthopaedic Surgery

## 2021-09-23 DIAGNOSIS — M25552 Pain in left hip: Secondary | ICD-10-CM

## 2021-09-23 DIAGNOSIS — Z96642 Presence of left artificial hip joint: Secondary | ICD-10-CM

## 2021-09-23 NOTE — Progress Notes (Signed)
The patient is a very pleasant and active 71 year old gentleman who has a history of a left hip replacement through direct anterior approach in 2020.  He says he has been doing well overall except for pain with left hip flexion.  It hurts with different activities but mainly his hip flexion.  He denies any specific groin pain and the pain he points 2 points right down the front of the hip where the iliopsoas tendon is in the hip flexors in general.  He walks without a limp.  His leg lengths on exam are equal as well.  His left hip moves smoothly and fluidly.  There is pain against resistance of hip flexion with the left hip.  An AP pelvis and lateral left hip shows a well-seated implant with no complicating features.  There is no evidence of loosening in the components have gone into the bone nicely.  At this point I do feel that he is having irritation and tendinitis of the iliopsoas tendon and hip flexors.  I would like to send him to Dr. Lynne Leader or Dr. Gardenia Phlegm for an ultrasound guided steroid injection in the left hip iliopsoas tendon.  Hopefully this injection will be both diagnostic and therapeutic.  I will then see him back myself in about 6 weeks to see how he is doing overall.  All questions and concerns were answered and addressed.  He agrees with this treatment plan.

## 2021-09-25 ENCOUNTER — Ambulatory Visit: Payer: Medicare Other

## 2021-09-25 ENCOUNTER — Encounter: Payer: Self-pay | Admitting: Family Medicine

## 2021-09-25 ENCOUNTER — Ambulatory Visit (INDEPENDENT_AMBULATORY_CARE_PROVIDER_SITE_OTHER): Payer: Medicare Other | Admitting: Family Medicine

## 2021-09-25 ENCOUNTER — Other Ambulatory Visit: Payer: Self-pay

## 2021-09-25 VITALS — BP 158/96 | HR 72 | Ht 71.5 in | Wt 215.4 lb

## 2021-09-25 DIAGNOSIS — M25552 Pain in left hip: Secondary | ICD-10-CM

## 2021-09-25 NOTE — Patient Instructions (Addendum)
Nice to meet you.  You had an injection into your L hip flexor/hip flexor tendon.  Call or go to the ER if you develop a large red swollen joint with extreme pain or oozing puss.   Follow-up: as needed

## 2021-09-25 NOTE — Progress Notes (Signed)
I, Wendy Poet, LAT, ATC, am serving as scribe for Dr. Lynne Leader.  Subjective:    CC: L hip pain  HPI: Pt is a 71 y/o male c/o L hip pain ever since his hip replacement that was aggravated this summer/fall after moving some heavy stones at his house.  Pt has a L hip replacement in 2020 done by Dr. Ninfa Linden. He was most recently seen by Dr. Georgina Snell on 09/23/21 for follow-up and was advised to see either Dr. Georgina Snell or Dr. Tamala Julian for a possible US-guided steroid injection of the L iliopsoas tendon.  Pt locates pain to his L ant hip.  Radiating pain: L proximal, ant thigh Aggravates: L hip flex AROM; getting into a car; L SLR Treatment tried: nothing  Dx imaging: 09/23/21 L hip XR  05/16/19 L hip XR  07/27/18 L hip XR  Pertinent review of Systems: No fevers or chills  Relevant historical information: Hypertension.  Left hip replacement.   Objective:    Vitals:   09/25/21 1355  BP: (!) 158/96  Pulse: 72  SpO2: 98%   General: Well Developed, well nourished, and in no acute distress.   MSK: Tip mature anterior scar otherwise normal. Normal motion. Pain with resisted hip flexion.   Lab and Radiology Results No results found for this or any previous visit (from the past 72 hour(s)). XR HIP UNILAT W OR W/O PELVIS 1V LEFT  Result Date: 09/23/2021 An AP pelvis and lateral left hip shows a well-seated total hip arthroplasty with no complicating features.  The components are bone ingrown and showed no evidence of loosening.   Procedure: Real-time Ultrasound Guided Injection of left anterior hip flexor tendon overlying total hip joint hardware (iliopsoas tendon) and calcification Device: Philips Affiniti 50G Images permanently stored and available for review in PACS Verbal informed consent obtained.  Discussed risks and benefits of procedure. Warned about infection bleeding damage to structures skin hypopigmentation and fat atrophy among others. Patient expresses understanding and  agreement Time-out conducted.   Noted no overlying erythema, induration, or other signs of local infection.   Skin prepped in a sterile fashion.   Local anesthesia: Topical Ethyl chloride.   With sterile technique and under real time ultrasound guidance: 40 mg of Kenalog and 4 mL of Marcaine injected into calcification just deep to the iliopsoas tendon as it crosses the hip replacement hardware anterior hip. Fluid seen entering the tendon.   Completed without difficulty   Pain moderately resolved suggesting accurate placement of the medication.   Advised to call if fevers/chills, erythema, induration, drainage, or persistent bleeding.   Images permanently stored and available for review in the ultrasound unit.  Impression: Technically successful ultrasound guided injection.      Impression and Recommendations:    Assessment and Plan: 71 y.o. male with left hip pain.  Patient has had persistent hip pain following a hip replacement.  Suspicion from Dr. Ninfa Linden and myself is that he very likely has tendinitis of the iliopsoas tendon or other hip flexor tendons.  My independent review of his most recent x-ray is that he does have some calcifications in this area that could represent calcific tendinitis. On ultrasound examination I did see a calcification structure associated with the deep hip flexor tendon (iliopsoas) as it crosses the hip joint hardware. I injected this structure in clinic which produced moderate immediate benefit indicating that this is very likely part of his pain generator.  He did not have an immediate complete pain response indicating  that he may have other pain generators in his anterior hip.  I did examine the scar and he may have a neuroma associated with the scar which could be producing some pain.  Potential next step could be trial of injection of the potential neuroma however this is in my opinion less likely to be producing his pain.  I would suggest that next  step may be MRI to further evaluate tendinitis and potentially provide a good target for injection in the future.  PDMP not reviewed this encounter. Orders Placed This Encounter  Procedures   [place] LIMITED JOINT SPACE STRUCTURES LOW LEFT(NO LINKED CHARGES)    Order Specific Question:   Reason for Exam (SYMPTOM  OR DIAGNOSIS REQUIRED)    Answer:   L hip pain    Order Specific Question:   Preferred imaging location?    Answer:   [facility]   No orders of the defined types were placed in this encounter.   Discussed warning signs or symptoms. Please see discharge instructions. Patient expresses understanding.   The above documentation has been reviewed and is accurate and complete Lynne Leader, M.D.

## 2021-09-29 ENCOUNTER — Encounter: Payer: Self-pay | Admitting: Orthopaedic Surgery

## 2021-09-29 ENCOUNTER — Encounter: Payer: Self-pay | Admitting: Family Medicine

## 2021-10-06 DIAGNOSIS — H9201 Otalgia, right ear: Secondary | ICD-10-CM | POA: Diagnosis not present

## 2021-10-06 DIAGNOSIS — I10 Essential (primary) hypertension: Secondary | ICD-10-CM | POA: Diagnosis not present

## 2021-10-06 DIAGNOSIS — H6981 Other specified disorders of Eustachian tube, right ear: Secondary | ICD-10-CM | POA: Diagnosis not present

## 2021-10-24 DIAGNOSIS — H6981 Other specified disorders of Eustachian tube, right ear: Secondary | ICD-10-CM | POA: Diagnosis not present

## 2021-10-24 DIAGNOSIS — H9313 Tinnitus, bilateral: Secondary | ICD-10-CM | POA: Diagnosis not present

## 2021-10-24 DIAGNOSIS — J309 Allergic rhinitis, unspecified: Secondary | ICD-10-CM | POA: Diagnosis not present

## 2021-11-03 ENCOUNTER — Other Ambulatory Visit: Payer: Self-pay

## 2021-11-03 ENCOUNTER — Encounter: Payer: Self-pay | Admitting: Orthopaedic Surgery

## 2021-11-03 ENCOUNTER — Ambulatory Visit (INDEPENDENT_AMBULATORY_CARE_PROVIDER_SITE_OTHER): Payer: Medicare Other | Admitting: Orthopaedic Surgery

## 2021-11-03 DIAGNOSIS — Z96642 Presence of left artificial hip joint: Secondary | ICD-10-CM | POA: Diagnosis not present

## 2021-11-03 DIAGNOSIS — M25552 Pain in left hip: Secondary | ICD-10-CM | POA: Diagnosis not present

## 2021-11-03 DIAGNOSIS — H903 Sensorineural hearing loss, bilateral: Secondary | ICD-10-CM | POA: Diagnosis not present

## 2021-11-03 MED ORDER — MELOXICAM 15 MG PO TABS: 15.0000 mg | ORAL_TABLET | Freq: Every day | ORAL | 1 refills | Status: DC | PRN

## 2021-11-03 NOTE — Progress Notes (Signed)
The patient comes in today for follow-up.  He is a 71 year old gentleman who had a left anterior hip replacement performed in January 2020.  When I saw him in early January of this year, he was having persistent pain along the flexor tendons of the left hip.  I sent him to Dr. Lynne Leader who did perform an ultrasound-guided steroid injection around the hip flexor tendons.  The patient did not get significant relief and in Dr. Clovis Riley office but then had a really good response the next day and the day after that.  He says he is significantly better but still has some pain.  His pain is still in hip flexion.  There is no blocks to rotation and blocks to flexion.  His incision looks good and there is no evidence of a neuroma.  There is no Tinel's sign around the incision and there is no numbness and tingling around the incision or pain.  His pain is mainly just with hip flexion and some abduction.  I will try meloxicam as an anti-inflammatory and give this more time.  He was recently on a steroid for an ear infection and that also helped his left hip.  I will see him back in 6 weeks to see how he is doing overall.  My next step would be considering outpatient physical therapy on this area.

## 2021-12-03 DIAGNOSIS — D2221 Melanocytic nevi of right ear and external auricular canal: Secondary | ICD-10-CM | POA: Diagnosis not present

## 2021-12-03 DIAGNOSIS — D485 Neoplasm of uncertain behavior of skin: Secondary | ICD-10-CM | POA: Diagnosis not present

## 2021-12-03 DIAGNOSIS — B078 Other viral warts: Secondary | ICD-10-CM | POA: Diagnosis not present

## 2021-12-03 DIAGNOSIS — D2262 Melanocytic nevi of left upper limb, including shoulder: Secondary | ICD-10-CM | POA: Diagnosis not present

## 2021-12-03 DIAGNOSIS — Z85828 Personal history of other malignant neoplasm of skin: Secondary | ICD-10-CM | POA: Diagnosis not present

## 2021-12-03 DIAGNOSIS — L821 Other seborrheic keratosis: Secondary | ICD-10-CM | POA: Diagnosis not present

## 2021-12-03 DIAGNOSIS — D225 Melanocytic nevi of trunk: Secondary | ICD-10-CM | POA: Diagnosis not present

## 2021-12-03 DIAGNOSIS — D2271 Melanocytic nevi of right lower limb, including hip: Secondary | ICD-10-CM | POA: Diagnosis not present

## 2021-12-15 ENCOUNTER — Other Ambulatory Visit: Payer: Self-pay

## 2021-12-15 ENCOUNTER — Ambulatory Visit (INDEPENDENT_AMBULATORY_CARE_PROVIDER_SITE_OTHER): Payer: Medicare Other | Admitting: Orthopaedic Surgery

## 2021-12-15 ENCOUNTER — Encounter: Payer: Self-pay | Admitting: Orthopaedic Surgery

## 2021-12-15 DIAGNOSIS — Z96642 Presence of left artificial hip joint: Secondary | ICD-10-CM | POA: Diagnosis not present

## 2021-12-15 DIAGNOSIS — M25552 Pain in left hip: Secondary | ICD-10-CM

## 2021-12-15 NOTE — Progress Notes (Signed)
The patient is a very pleasant 71 year old gentleman who is getting close to 3 years out from a left anterior hip replacement.  He is still having pain along the trochanteric area and some of the anterior hip itself.  When we saw him back in January we sent him to Dr. Georgina Snell who did provide a steroid injection along the iliopsoas tendon.  He did not get any relief with that injection on the first day but then after several days he got 90% better and has been doing well but then his symptoms have reoccurred.  He is able to walk well but he has problems when he picks his leg up and flexes the hip and it seems to be associated with the surgery itself and the replacement.  I did x-ray the hip in January of this year and did not find any worrisome features. ? ?On my exam today the hip does move smoothly and fluidly on the left side but there is persistent pain with hip flexion and pain over the proximal IT band and trochanteric area. ? ?I would like to send him for an MRI at this point of his left hip to assess the tendons in this area as well as look at the potential for any fluid collections or even loosening of the femoral component.  He will schedule follow-up with [place] once we have the MRI.  All questions and concerns were answered and addressed. ?

## 2021-12-23 ENCOUNTER — Ambulatory Visit
Admission: RE | Admit: 2021-12-23 | Discharge: 2021-12-23 | Disposition: A | Discharge status: Home / Self Care | Payer: Medicare Other | Source: Ambulatory Visit | Attending: Orthopaedic Surgery | Admitting: Orthopaedic Surgery

## 2021-12-23 DIAGNOSIS — G8929 Other chronic pain: Secondary | ICD-10-CM | POA: Diagnosis not present

## 2021-12-23 DIAGNOSIS — Z96642 Presence of left artificial hip joint: Secondary | ICD-10-CM

## 2021-12-23 DIAGNOSIS — M1612 Unilateral primary osteoarthritis, left hip: Secondary | ICD-10-CM | POA: Diagnosis not present

## 2021-12-28 ENCOUNTER — Other Ambulatory Visit: Payer: Self-pay | Admitting: Orthopaedic Surgery

## 2022-01-05 ENCOUNTER — Encounter: Payer: Self-pay | Admitting: Cardiovascular Disease

## 2022-01-06 ENCOUNTER — Ambulatory Visit (INDEPENDENT_AMBULATORY_CARE_PROVIDER_SITE_OTHER): Payer: Medicare Other | Admitting: Orthopaedic Surgery

## 2022-01-06 ENCOUNTER — Encounter: Payer: Self-pay | Admitting: Orthopaedic Surgery

## 2022-01-06 DIAGNOSIS — Z96642 Presence of left artificial hip joint: Secondary | ICD-10-CM | POA: Diagnosis not present

## 2022-01-06 NOTE — Progress Notes (Signed)
The patient comes in today to go over an MRI of his left hip.  He has a hip replacement to be performed through direct injure approach in 2020.  He has had hip flexion pain and we did send him for an injection of a steroid in the iliopsoas tendon around that area which helped significantly for about a month.  At this point we decided to send him for an MRI to evaluate the muscles and tendons in the anterior hip.  I have scrutinized the x-rays and there is not significant overhang of the acetabular implant.  His pain is still consistent with hip flexion and it does point to scar tissue. ? ?The MRIs reviewed and it does not show any fluid collections around the anterior hip or any other complicating features of the hip replacement itself.  There is some evidence of mild arthritis of the right hip.  He is asymptomatic of the right side.  He still does have pain with hip flexion. ? ?At this point I would like to send him to physical therapy to work on any modalities that can hopefully decrease the pain in this area of his left hip.  He agrees with this course of treatment.  I will see him back in 6 weeks to see if that therapy has helped.  We talked about the possibility of exploring this area surgically if follow-up fails the knee is bothering him enough. ?

## 2022-01-09 DIAGNOSIS — U071 COVID-19: Secondary | ICD-10-CM | POA: Diagnosis not present

## 2022-01-13 NOTE — Progress Notes (Signed)
? ?Cardiology Clinic Note  ? ?Patient Name: Keith Hernandez ?Date of Encounter: 01/15/2022 ? ?Primary Care Provider:  Velna Hatchet, MD ?Primary Cardiologist:  Evalina Field, MD ? ?Patient Profile  ?  ?Keith Hernandez 71 year old male presents to the clinic today for follow-up evaluation of his atrial tachycardia and atrial fibrillation. ? ?Past Medical History  ?  ?Past Medical History:  ?Diagnosis Date  ? AAA (abdominal aortic aneurysm) ([place])   ? Arthritis   ? Bulging of cervical intervertebral disc   ? C6-7  ? Dupuytren's disease   ? GERD (gastroesophageal reflux disease)   ? OTC meds pnr  ? Hyperlipidemia   ? Hypertension   ? Migraines   ? ?Past Surgical History:  ?Procedure Laterality Date  ? achilles reattachment    ? FASCIOTOMY Left 10/11/2015  ? Procedure: LEFT HAND SUBTOTAL PALMAR FASCIOTOMY WITH REPAIR RECONSTRUCTION ;  Surgeon: Roseanne Kaufman, MD;  Location: [facility];  Service: Orthopedics;  Laterality: Left;  ? LESION DESTRUCTION Bilateral 07/29/2017  ? Procedure: EXCISION LIP LESION;  Surgeon: [name], DO;  Location: [facility];  Service: Plastics;  Laterality: Bilateral;  ? LIPOMA EXCISION Right 07/29/2017  ? Procedure: EXCISION BACK LIPOMA;  Surgeon: [name], DO;  Location: [facility];  Service: Plastics;  Laterality: Right;  ? TOTAL HIP ARTHROPLASTY Left 09/30/2018  ? Procedure: LEFT TOTAL HIP ARTHROPLASTY ANTERIOR APPROACH;  Surgeon: Mcarthur Rossetti, MD;  Location: [facility];  Service: Orthopedics;  Laterality: Left;  ? ? ?Allergies ? ?No Known Allergies ? ?History of Present Illness  ?  ?Keith Hernandez has a PMH of atrial tachycardia, AAA, hyperlipidemia, GERD, hypertension, Dupuytren's disease, and eustachian tube dysfunction. ? ?He was seen by Dr. Audie Box 12/24/20 in follow-up for palpitations.  He was found to have brief atrial tachycardia episodes.  Conservative management was recommended.  During follow-up he reported  still having episodes.  His episodes could occur once per week.  They were lasting seconds at a time.  He describes the sensations as fluttering in his chest.  He reported poor sleep.  He did snore but there were no concerns for sleep apnea.  His blood pressure was well controlled at home.  He was tolerating his Crestor well.  He was walking 2 miles 3-4 times per week.  He denied chest pain shortness of breath.  He was noted to have sinus physical limitation related to his arthritis.  He denied increased caffeine consumption. ? ?He presents to the clinic today for follow-up evaluation and states he requested his appointment today due to a period of irregular heartbeat.  He owns a cardia mobile device and was able to capture the episode.  We reviewed the EKGs which showed extra atrial and ventricular beats.  He reports that it lasted for about an hour and 15 minutes to hour and a half back to its normal pattern.  This was in the setting of drinking wine and a martini.  We reviewed triggers for atrial fibrillation and he expressed understanding.  His blood pressure initially today was 160/90 and on recheck 144/86.  He maintains a blood pressure log at home and reports blood pressures in the 627-035 systolic range.  I will have him avoid triggers for atrial fibrillation, maintain p.o. hydration, increase physical activity as tolerated, and follow-up as scheduled.  We will request his labs from his PCP. ? ?Today he denies chest pain, shortness of breath, lower extremity edema, fatigue, palpitations,  melena, hematuria, hemoptysis, diaphoresis, weakness, presyncope, syncope, orthopnea, and PND. ? ? ? ?Home Medications  ?  ?Prior to Admission medications   ?Medication Sig Start Date End Date Taking? Authorizing Provider  ?aspirin EC 81 MG tablet Take 81 mg by mouth daily.    [provider]  ?Cholecalciferol (VITAMIN D3 PO) Take 1 capsule by mouth daily.    [provider]  ?meloxicam (MOBIC) 15 MG tablet  TAKE 1 TABLET BY MOUTH EVERY DAY AS NEEDED FOR PAIN 12/29/21   Mcarthur Rossetti, MD  ?Multiple Vitamin (MULTIVITAMIN) tablet Take 1 tablet by mouth daily.    [provider]  ?olmesartan (BENICAR) 40 MG tablet Take 20 mg by mouth 2 (two) times daily.     [provider]  ?rosuvastatin (CRESTOR) 5 MG tablet TAKE 1 TABLET (5 MG TOTAL) BY MOUTH DAILY. 09/04/21   O'NealCassie Freer, MD  ?triamcinolone (NASACORT) 55 MCG/ACT AERO nasal inhaler Place 1 spray into the nose daily as needed (for congestion).    [provider]  ? ? ?Family History  ?  ?Family History  ?Problem Relation Age of Onset  ? Arthritis Mother   ? Heart disease Mother   ? Heart attack Mother   ? Asthma Father   ? Asthma Paternal Grandfather   ? ?He indicated that his mother is deceased. He indicated that his father is deceased. He indicated that the status of his paternal grandfather is unknown. ? ?Social History  ?  ?Social History  ? ?Socioeconomic History  ? Marital status: Married  ?  Spouse name: Not on file  ? Number of children: 0  ? Years of education: Not on file  ? Highest education level: Not on file  ?Occupational History  ? Occupation: retired  ?Tobacco Use  ? Smoking status: Former  ?  Packs/day: 1.00  ?  Years: 30.00  ?  Pack years: 30.00  ?  Types: Cigarettes  ?  Quit date: 07/15/1999  ?  Years since quitting: 22.5  ? Smokeless tobacco: Never  ?Vaping Use  ? Vaping Use: Never used  ?Substance and Sexual Activity  ? Alcohol use: Yes  ?  Alcohol/week: 7.0 standard drinks  ?  Types: 5 Glasses of wine, 2 Shots of liquor per week  ?  Comment: social  ? Drug use: No  ? Sexual activity: Not on file  ?Other Topics Concern  ? Not on file  ?Social History Narrative  ? Not on file  ? ?Social Determinants of Health  ? ?Financial Resource Strain: Not on file  ?Food Insecurity: Not on file  ?Transportation Needs: Not on file  ?Physical Activity: Not on file  ?Stress: Not on file  ?Social Connections: Not on file   ?Intimate Partner Violence: Not on file  ?  ? ?Review of Systems  ?  ?General:  No chills, fever, night sweats or weight changes.  ?Cardiovascular:  No chest pain, dyspnea on exertion, edema, orthopnea, palpitations, paroxysmal nocturnal dyspnea. ?Dermatological: No rash, lesions/masses ?Respiratory: No cough, dyspnea ?Urologic: No hematuria, dysuria ?Abdominal:   No nausea, vomiting, diarrhea, bright red blood per rectum, melena, or hematemesis ?Neurologic:  No visual changes, wkns, changes in mental status. ?All other systems reviewed and are otherwise negative except as noted above. ? ?Physical Exam  ?  ?VS:  BP (!) 144/86   Pulse 66   Ht 5' 11.5" (1.816 m)   Wt 210 lb 12.8 oz (95.6 kg)   SpO2 98%   BMI 28.99  kg/m?  , BMI Body mass index is 28.99 kg/m?. ?GEN: Well nourished, well developed, in no acute distress. ?HEENT: normal. ?Neck: Supple, no JVD, carotid bruits, or masses. ?Cardiac: RRR, no murmurs, rubs, or gallops. No clubbing, cyanosis, edema.  Radials/DP/PT 2+ and equal bilaterally.  ?Respiratory:  Respirations regular and unlabored, clear to auscultation bilaterally. ?GI: Soft, nontender, nondistended, BS + x 4. ?MS: no deformity or atrophy. ?Skin: warm and dry, no rash. ?Neuro:  Strength and sensation are intact. ?Psych: Normal affect. ? ?Accessory Clinical Findings  ?  ?Recent Labs: ?No results found for requested labs within last 8760 hours.  ? ?Recent Lipid Panel ?No results found for: CHOL, TRIG, HDL, CHOLHDL, VLDL, LDLCALC, LDLDIRECT ? ?ECG personally reviewed by me today-normal sinus rhythm no ST or T wave deviation 66 bpm- No acute changes ? ?Cardiac event monitor 10/15/2020 ?Impression: ?1. Brief ectopic atrial tachycardia detected (4 episodes in 7 days; longest episode 6.8 seconds).  ?2. Rare ectopy.  ?  ?[place] T. Audie Box, MD, [facility] ?[place]  ?[address], [address] ?[place], [place] [address] ?([phone]  ?3:07 PM ?Assessment & Plan  ? ?1.  Palpitations/ectopic  atrial tachycardia-had 1 episode of palpitations that lasted an hour and 15 minutes - 1 hour and half.  This was captured on his cardia mobile device and we reviewed.  It appears to be extra atrial and ventricular

## 2022-01-14 DIAGNOSIS — Z96642 Presence of left artificial hip joint: Secondary | ICD-10-CM | POA: Diagnosis not present

## 2022-01-14 DIAGNOSIS — M6281 Muscle weakness (generalized): Secondary | ICD-10-CM | POA: Diagnosis not present

## 2022-01-15 ENCOUNTER — Encounter: Payer: Self-pay | Admitting: General Practice

## 2022-01-15 ENCOUNTER — Telehealth: Payer: Self-pay

## 2022-01-15 ENCOUNTER — Ambulatory Visit (INDEPENDENT_AMBULATORY_CARE_PROVIDER_SITE_OTHER): Payer: Medicare Other | Admitting: General Practice

## 2022-01-15 VITALS — BP 144/86 | HR 66 | Ht 71.5 in | Wt 210.8 lb

## 2022-01-15 DIAGNOSIS — I10 Essential (primary) hypertension: Secondary | ICD-10-CM | POA: Diagnosis not present

## 2022-01-15 DIAGNOSIS — H52203 Unspecified astigmatism, bilateral: Secondary | ICD-10-CM | POA: Diagnosis not present

## 2022-01-15 DIAGNOSIS — E782 Mixed hyperlipidemia: Secondary | ICD-10-CM | POA: Diagnosis not present

## 2022-01-15 DIAGNOSIS — I714 Abdominal aortic aneurysm, without rupture, unspecified: Secondary | ICD-10-CM | POA: Diagnosis not present

## 2022-01-15 DIAGNOSIS — I471 Supraventricular tachycardia: Secondary | ICD-10-CM

## 2022-01-15 DIAGNOSIS — H2513 Age-related nuclear cataract, bilateral: Secondary | ICD-10-CM | POA: Diagnosis not present

## 2022-01-15 DIAGNOSIS — I4719 Other supraventricular tachycardia: Secondary | ICD-10-CM

## 2022-01-15 DIAGNOSIS — R002 Palpitations: Secondary | ICD-10-CM | POA: Diagnosis not present

## 2022-01-15 NOTE — Telephone Encounter (Signed)
Informed patient of fasting blood work. Orders mailed to him. ?

## 2022-01-15 NOTE — Patient Instructions (Signed)
Medication Instructions:  ?Your Physician recommend you continue on your current medication as directed.   ? ?*If you need a refill on your cardiac medications before your next appointment, please call your pharmacy* ? ? ?Follow-Up: ?At [facility], you and your health needs are our priority.  As part of our continuing mission to provide you with exceptional heart care, we have created designated Provider Care Teams.  These Care Teams include your primary Cardiologist (physician) and Advanced Practice Providers (APPs -  Physician Assistants and Nurse Practitioners) who all work together to provide you with the care you need, when you need it. ? ?We recommend signing up for the patient portal called "MyChart".  Sign up information is provided on this After Visit Summary.  MyChart is used to connect with patients for Virtual Visits (Telemedicine).  Patients are able to view lab/test results, encounter notes, upcoming appointments, etc.  Non-urgent messages can be sent to your provider as well.   ?To learn more about what you can do with MyChart, go to [email or web address].   ? ?Your next appointment:   ? Wednesday March 11, 2022 at 11 am ? ?The format for your next appointment:   ?In Person ? ?Provider:   ?Evalina Field, MD  ? ? ?Other Instructions ?Avoid a fib triggers: avoid caffeine, chocolate, alcohol, dehydration. ? ?Keep a diary of your blood pressure. If your blood pressure is higher than 130/80 consistently, please call our office. ? ?Increase physical activity as tolerated. ? ?

## 2022-01-15 NOTE — Addendum Note (Signed)
Addended by: Betha Loa F on: 01/15/2022 11:23 AM ? ? Modules accepted: Orders ? ?

## 2022-01-16 ENCOUNTER — Other Ambulatory Visit: Payer: Self-pay

## 2022-01-16 ENCOUNTER — Ambulatory Visit (INDEPENDENT_AMBULATORY_CARE_PROVIDER_SITE_OTHER): Payer: Medicare Other

## 2022-01-16 DIAGNOSIS — I4719 Other supraventricular tachycardia: Secondary | ICD-10-CM

## 2022-01-16 DIAGNOSIS — I471 Supraventricular tachycardia: Secondary | ICD-10-CM

## 2022-01-16 NOTE — Progress Notes (Unsigned)
Enrolled patient for a 7 day Zio XT monitor to be mailed to patients home.  

## 2022-01-20 ENCOUNTER — Telehealth: Payer: Self-pay | Admitting: Orthopaedic Surgery

## 2022-01-20 ENCOUNTER — Encounter: Payer: Self-pay | Admitting: Orthopaedic Surgery

## 2022-01-20 DIAGNOSIS — Z1159 Encounter for screening for other viral diseases: Secondary | ICD-10-CM | POA: Diagnosis not present

## 2022-01-20 NOTE — Telephone Encounter (Signed)
Pt called requesting plan of care to be faxed to [facility]. Please fax to [phone]. ?

## 2022-01-20 NOTE — Telephone Encounter (Signed)
Noted please see pt. Portal message ?

## 2022-01-22 DIAGNOSIS — I471 Supraventricular tachycardia: Secondary | ICD-10-CM

## 2022-01-30 DIAGNOSIS — Z96642 Presence of left artificial hip joint: Secondary | ICD-10-CM | POA: Diagnosis not present

## 2022-01-30 DIAGNOSIS — M6281 Muscle weakness (generalized): Secondary | ICD-10-CM | POA: Diagnosis not present

## 2022-02-02 DIAGNOSIS — Z96642 Presence of left artificial hip joint: Secondary | ICD-10-CM | POA: Diagnosis not present

## 2022-02-02 DIAGNOSIS — M6281 Muscle weakness (generalized): Secondary | ICD-10-CM | POA: Diagnosis not present

## 2022-02-04 DIAGNOSIS — I471 Supraventricular tachycardia: Secondary | ICD-10-CM | POA: Diagnosis not present

## 2022-02-04 DIAGNOSIS — M6281 Muscle weakness (generalized): Secondary | ICD-10-CM | POA: Diagnosis not present

## 2022-02-04 DIAGNOSIS — Z96642 Presence of left artificial hip joint: Secondary | ICD-10-CM | POA: Diagnosis not present

## 2022-02-06 DIAGNOSIS — M6281 Muscle weakness (generalized): Secondary | ICD-10-CM | POA: Diagnosis not present

## 2022-02-06 DIAGNOSIS — Z96642 Presence of left artificial hip joint: Secondary | ICD-10-CM | POA: Diagnosis not present

## 2022-02-11 ENCOUNTER — Encounter: Payer: Self-pay | Admitting: Orthopaedic Surgery

## 2022-02-18 NOTE — Progress Notes (Signed)
Cardiology Office Note:   Date:  02/19/2022  NAME:  Keith Hernandez    MRN: [id number] DOB:  [birth year]   PCP:  Velna Hatchet, MD  Cardiologist:  Evalina Field, MD  Electrophysiologist:  None   Referring MD: Velna Hatchet, MD   Chief Complaint  Patient presents with   Atrial Fibrillation         History of Present Illness:   Keith Hernandez is a 71 y.o. male with a hx of pAF, HTN, HLD, AAA who presents for follow-up.  He reports he continues to have episodes of palpitations.  Describes a fluttering in his chest.  Symptoms occur every 2 to 3 days.  They last seconds.  Review of cardia mobile shows no definitive A-fib.  He is having PACs and PVCs.  He did have documented atrial fibrillation in April.  Episode was paroxysmal.  Review of his monitor also shows likely brief A-fib episodes.  EKG shows sinus rhythm today.  He denies any chest pain or trouble breathing.  Blood pressure 133/93.  Doing yard work and staying active.  He does snore.  He has fatigue.  Suspect he may have sleep apnea.  This could be contributing.  He needs his thyroid checked and a full panel of labs.  We will have this done next week by his primary care physician.  We discussed atrial fibrillation with he and his wife today.  He appears to be having paroxysmal atrial fibrillation episodes.  We will work this up further.  We need to look for any reversible cause such as sleep apnea or thyroid dysfunction.  He understands this.  He is having too few episodes to merit aggressive care at this time.  Problem List 1. AAA -3.2 cm 2. HLD -Total cholesterol 177, HDL 49, LDL 108, triglycerides 101 3. HTN 4. Paroxysmal Afib -CHADSVASC=2 (age, HTN) 5. Coronary calcium 11 (20th percentile)  Past Medical History: Past Medical History:  Diagnosis Date   AAA (abdominal aortic aneurysm) (HCC)    Arthritis    Bulging of cervical intervertebral disc    C6-7   Dupuytren's disease    GERD (gastroesophageal reflux disease)     OTC meds pnr   Hyperlipidemia    Hypertension    Migraines     Past Surgical History: Past Surgical History:  Procedure Laterality Date   achilles reattachment     FASCIOTOMY Left 10/11/2015   Procedure: LEFT HAND SUBTOTAL PALMAR FASCIOTOMY WITH REPAIR RECONSTRUCTION ;  Surgeon: Roseanne Kaufman, MD;  Location: [facility];  Service: Orthopedics;  Laterality: Left;   LESION DESTRUCTION Bilateral 07/29/2017   Procedure: EXCISION LIP LESION;  Surgeon: [name], DO;  Location: [facility];  Service: Plastics;  Laterality: Bilateral;   LIPOMA EXCISION Right 07/29/2017   Procedure: EXCISION BACK LIPOMA;  Surgeon: [name], DO;  Location: [facility];  Service: Plastics;  Laterality: Right;   TOTAL HIP ARTHROPLASTY Left 09/30/2018   Procedure: LEFT TOTAL HIP ARTHROPLASTY ANTERIOR APPROACH;  Surgeon: Mcarthur Rossetti, MD;  Location: [facility];  Service: Orthopedics;  Laterality: Left;    Current Medications: Current Meds  Medication Sig   apixaban (ELIQUIS) 5 MG TABS tablet Take 1 tablet (5 mg total) by mouth 2 (two) times daily.   Cholecalciferol (VITAMIN D3 PO) Take 1 capsule by mouth daily.   metoprolol succinate (TOPROL XL) 25 MG 24 hr tablet Take 1 tablet (25 mg total) by mouth daily.   Multiple  Vitamin (MULTIVITAMIN) tablet Take 1 tablet by mouth daily.   olmesartan (BENICAR) 40 MG tablet Take 20 mg by mouth 2 (two) times daily.    rosuvastatin (CRESTOR) 5 MG tablet TAKE 1 TABLET (5 MG TOTAL) BY MOUTH DAILY.   triamcinolone (NASACORT) 55 MCG/ACT AERO nasal inhaler Place 1 spray into the nose daily as needed (for congestion).   [DISCONTINUED] aspirin EC 81 MG tablet Take 81 mg by mouth daily.     Allergies:    Patient has no known allergies.   Social History: Social History   Socioeconomic History   Marital status: Married    Spouse name: Not on file   Number of children: 0   Years of education: Not on file    Highest education level: Not on file  Occupational History   Occupation: retired  Tobacco Use   Smoking status: Former    Packs/day: 1.00    Years: 30.00    Total pack years: 30.00    Types: Cigarettes    Quit date: 07/15/1999    Years since quitting: 22.6   Smokeless tobacco: Never  Vaping Use   Vaping Use: Never used  Substance and Sexual Activity   Alcohol use: Yes    Alcohol/week: 7.0 standard drinks of alcohol    Types: 5 Glasses of wine, 2 Shots of liquor per week    Comment: social   Drug use: No   Sexual activity: Not on file  Other Topics Concern   Not on file  Social History Narrative   Not on file   Social Determinants of Health   Financial Resource Strain: Not on file  Food Insecurity: Not on file  Transportation Needs: Not on file  Physical Activity: Not on file  Stress: Not on file  Social Connections: Not on file     Family History: The patient's family history includes Arthritis in his mother; Asthma in his father and paternal grandfather; Heart attack in his mother; Heart disease in his mother.  ROS:   All other ROS reviewed and negative. Pertinent positives noted in the HPI.     EKGs/Labs/Other Studies Reviewed:   The following studies were personally reviewed by me today:  EKG:  EKG is ordered today.  The ekg ordered today demonstrates normal sinus rhythm heart rate 66, no acute ischemic changes or evidence of infarction, and was personally reviewed by me.   Recent Labs: No results found for requested labs within last 365 days.   Recent Lipid Panel No results found for: "CHOL", "TRIG", "HDL", "CHOLHDL", "VLDL", "LDLCALC", "LDLDIRECT"  Physical Exam:   VS:  BP (!) 133/93   Pulse 66   Ht '5\' 11"'$  (1.803 m)   Wt 213 lb 12.8 oz (97 kg)   SpO2 97%   BMI 29.82 kg/m    Wt Readings from Last 3 Encounters:  02/19/22 213 lb 12.8 oz (97 kg)  01/15/22 210 lb 12.8 oz (95.6 kg)  09/25/21 215 lb 6.4 oz (97.7 kg)    General: Well nourished, well  developed, in no acute distress Head: Atraumatic, normal size  Eyes: PEERLA, EOMI  Neck: Supple, no JVD Endocrine: No thryomegaly Cardiac: Normal S1, S2; RRR; no murmurs, rubs, or gallops Lungs: Clear to auscultation bilaterally, no wheezing, rhonchi or rales  Abd: Soft, nontender, no hepatomegaly  Ext: No edema, pulses 2+ Musculoskeletal: No deformities, BUE and BLE strength normal and equal Skin: Warm and dry, no rashes   Neuro: Alert and oriented to person, place, time, and situation,  CNII-XII grossly intact, no focal deficits  Psych: Normal mood and affect   ASSESSMENT:   KOLYN ROZARIO is a 71 y.o. male who presents for the following: 1. Paroxysmal atrial fibrillation (HCC)   2. Primary hypertension   3. Snoring   4. Other fatigue     PLAN:   1. Paroxysmal atrial fibrillation (HCC) -Paroxysmal atrial fibrillation episodes.  CHA2DS2-VASc score equals 2.  Start Eliquis 5 mg twice daily. -Had a documented episode in April.  Really only having PACs for now.  We will start metoprolol succinate 25 mg daily.  He will continue to monitor his heart rhythm at home. -He will need a full panel of labs including TSH, CBC, CMP and cholesterol.  This will be checked by his primary care physician next week and he will forward me the results. -No murmurs.  He needs an echo.  Calcium score very minimal.  He does need a stress test just to make sure nothing is changed given a new diagnosis of atrial fibrillation. -He will continue to monitor his heart rhythm and see me back in 3 months.  Sleep study as below.  2. Primary hypertension -Starting metoprolol succinate 25 mg daily.  May need to back off on olmesartan.  We will see how he does.  3. Snoring 4. Other fatigue -He does snore.  He is fatigued.  This occurs daily.  He will need a home sleep study with a new diagnosis of atrial fibrillation.   Shared Decision Making/Informed Consent The risks [chest pain, shortness of breath, cardiac  arrhythmias, dizziness, blood pressure fluctuations, myocardial infarction, stroke/transient ischemic attack, nausea, vomiting, allergic reaction, radiation exposure, metallic taste sensation and life-threatening complications (estimated to be 1 in 10,000)], benefits (risk stratification, diagnosing coronary artery disease, treatment guidance) and alternatives of a nuclear stress test were discussed in detail with Mr. Coury and he agrees to proceed.  Disposition: Return in about 3 months (around 05/22/2022).  Medication Adjustments/Labs and Tests Ordered: Current medicines are reviewed at length with the patient today.  Concerns regarding medicines are outlined above.  Orders Placed This Encounter  Procedures   MYOCARDIAL PERFUSION IMAGING   EKG 12-Lead   Home sleep test   Meds ordered this encounter  Medications   apixaban (ELIQUIS) 5 MG TABS tablet    Sig: Take 1 tablet (5 mg total) by mouth 2 (two) times daily.    Dispense:  60 tablet    Refill:  3   metoprolol succinate (TOPROL XL) 25 MG 24 hr tablet    Sig: Take 1 tablet (25 mg total) by mouth daily.    Dispense:  90 tablet    Refill:  1    Patient Instructions  Medication Instructions:  STOP Aspirin START Eliquis 5 mg twice daily  START Metoprolol Succinate 25 mg daily   *If you need a refill on your cardiac medications before your next appointment, please call your pharmacy*   Testing/Procedures: Your physician has requested that you have a lexiscan myoview. For further information please visit [email or web address]. Please follow instruction sheet, as given.  Your physician has recommended that you have a sleep study. This test records several body functions during sleep, including: brain activity, eye movement, oxygen and carbon dioxide blood levels, heart rate and rhythm, breathing rate and rhythm, the flow of air through your mouth and nose, snoring, body muscle movements, and chest and belly movement.   Follow-Up: At  [facility], you and your health needs are our priority.  As  part of our continuing mission to provide you with exceptional heart care, we have created designated Provider Care Teams.  These Care Teams include your primary Cardiologist (physician) and Advanced Practice Providers (APPs -  Physician Assistants and Nurse Practitioners) who all work together to provide you with the care you need, when you need it.  We recommend signing up for the patient portal called "MyChart".  Sign up information is provided on this After Visit Summary.  MyChart is used to connect with patients for Virtual Visits (Telemedicine).  Patients are able to view lab/test results, encounter notes, upcoming appointments, etc.  Non-urgent messages can be sent to your provider as well.   To learn more about what you can do with MyChart, go to [email or web address].    Your next appointment:   3 month(s)  The format for your next appointment:   In Person  Provider:   Evalina Field, MD            Time Spent with Patient: I have spent a total of 35 minutes with patient reviewing hospital notes, telemetry, EKGs, labs and examining the patient as well as establishing an assessment and plan that was discussed with the patient.  > 50% of time was spent in direct patient care.  Signed, Addison Naegeli. Audie Box, MD, [facility]  [address] [place], [place] [address] [phone]  02/19/2022 10:32 AM

## 2022-02-19 ENCOUNTER — Ambulatory Visit (INDEPENDENT_AMBULATORY_CARE_PROVIDER_SITE_OTHER): Payer: Medicare Other | Admitting: Cardiovascular Disease

## 2022-02-19 ENCOUNTER — Encounter: Payer: Self-pay | Admitting: Cardiovascular Disease

## 2022-02-19 ENCOUNTER — Encounter: Payer: Self-pay | Admitting: *Deleted

## 2022-02-19 ENCOUNTER — Other Ambulatory Visit: Payer: Self-pay

## 2022-02-19 ENCOUNTER — Telehealth (HOSPITAL_COMMUNITY): Payer: Self-pay

## 2022-02-19 VITALS — BP 133/93 | HR 66 | Ht 71.0 in | Wt 213.8 lb

## 2022-02-19 DIAGNOSIS — I480 Paroxysmal atrial fibrillation: Secondary | ICD-10-CM | POA: Diagnosis not present

## 2022-02-19 DIAGNOSIS — I10 Essential (primary) hypertension: Secondary | ICD-10-CM

## 2022-02-19 DIAGNOSIS — R5383 Other fatigue: Secondary | ICD-10-CM | POA: Diagnosis not present

## 2022-02-19 DIAGNOSIS — R0683 Snoring: Secondary | ICD-10-CM | POA: Diagnosis not present

## 2022-02-19 MED ORDER — METOPROLOL SUCCINATE ER 25 MG PO TB24: 25.0000 mg | ORAL_TABLET | Freq: Every day | ORAL | 1 refills | Status: DC

## 2022-02-19 MED ORDER — APIXABAN 5 MG PO TABS: 5.0000 mg | ORAL_TABLET | Freq: Two times a day (BID) | ORAL | 3 refills | Status: DC

## 2022-02-19 NOTE — Patient Instructions (Signed)
Medication Instructions:  STOP Aspirin START Eliquis 5 mg twice daily  START Metoprolol Succinate 25 mg daily   *If you need a refill on your cardiac medications before your next appointment, please call your pharmacy*   Testing/Procedures: Your physician has requested that you have a lexiscan myoview. For further information please visit [email or web address]. Please follow instruction sheet, as given.  Your physician has recommended that you have a sleep study. This test records several body functions during sleep, including: brain activity, eye movement, oxygen and carbon dioxide blood levels, heart rate and rhythm, breathing rate and rhythm, the flow of air through your mouth and nose, snoring, body muscle movements, and chest and belly movement.   Follow-Up: At [facility], you and your health needs are our priority.  As part of our continuing mission to provide you with exceptional heart care, we have created designated Provider Care Teams.  These Care Teams include your primary Cardiologist (physician) and Advanced Practice Providers (APPs -  Physician Assistants and Nurse Practitioners) who all work together to provide you with the care you need, when you need it.  We recommend signing up for the patient portal called "MyChart".  Sign up information is provided on this After Visit Summary.  MyChart is used to connect with patients for Virtual Visits (Telemedicine).  Patients are able to view lab/test results, encounter notes, upcoming appointments, etc.  Non-urgent messages can be sent to your provider as well.   To learn more about what you can do with MyChart, go to [email or web address].    Your next appointment:   3 month(s)  The format for your next appointment:   In Person  Provider:   Evalina Field, MD

## 2022-02-19 NOTE — Telephone Encounter (Signed)
Spoke with the patient, detailed instructions given and left on the patient's answering machine. He stated that he would be here for his test. Asked to call back with any questions. S.Beata Beason EMTP

## 2022-02-20 DIAGNOSIS — Z96642 Presence of left artificial hip joint: Secondary | ICD-10-CM | POA: Diagnosis not present

## 2022-02-20 DIAGNOSIS — M6281 Muscle weakness (generalized): Secondary | ICD-10-CM | POA: Diagnosis not present

## 2022-02-23 ENCOUNTER — Other Ambulatory Visit: Payer: Self-pay

## 2022-02-23 DIAGNOSIS — M6281 Muscle weakness (generalized): Secondary | ICD-10-CM | POA: Diagnosis not present

## 2022-02-23 DIAGNOSIS — Z96642 Presence of left artificial hip joint: Secondary | ICD-10-CM | POA: Diagnosis not present

## 2022-02-23 DIAGNOSIS — I480 Paroxysmal atrial fibrillation: Secondary | ICD-10-CM

## 2022-02-24 ENCOUNTER — Ambulatory Visit (HOSPITAL_COMMUNITY): Payer: Medicare Other | Attending: Cardiology

## 2022-02-24 ENCOUNTER — Encounter: Payer: Self-pay | Admitting: Cardiovascular Disease

## 2022-02-24 ENCOUNTER — Ambulatory Visit: Payer: Medicare Other | Admitting: Orthopaedic Surgery

## 2022-02-24 DIAGNOSIS — I480 Paroxysmal atrial fibrillation: Secondary | ICD-10-CM | POA: Diagnosis not present

## 2022-02-24 LAB — MYOCARDIAL PERFUSION IMAGING
LV dias vol: 99 mL (ref 62–150)
LV sys vol: 40 mL
Nuc Stress EF: 59 %
Peak HR: 88 {beats}/min
Rest HR: 55 {beats}/min
Rest Nuclear Isotope Dose: 10.5 mCi
SDS: 0
SRS: 0
SSS: 0
ST Depression (mm): 0 mm
Stress Nuclear Isotope Dose: 32.2 mCi
TID: 1.07

## 2022-02-24 MED ORDER — TECHNETIUM TC 99M TETROFOSMIN IV KIT
32.2000 | PACK | Freq: Once | INTRAVENOUS | Status: AC | PRN
  Administered 2022-02-24: 32.2 via INTRAVENOUS

## 2022-02-24 MED ORDER — REGADENOSON 0.4 MG/5ML IV SOLN
0.4000 mg | Freq: Once | INTRAVENOUS | Status: AC
  Administered 2022-02-24: 0.4 mg via INTRAVENOUS

## 2022-02-24 MED ORDER — TECHNETIUM TC 99M TETROFOSMIN IV KIT
10.5000 | PACK | Freq: Once | INTRAVENOUS | Status: AC | PRN
  Administered 2022-02-24: 10.5 via INTRAVENOUS

## 2022-02-26 DIAGNOSIS — Z96642 Presence of left artificial hip joint: Secondary | ICD-10-CM | POA: Diagnosis not present

## 2022-02-26 DIAGNOSIS — M6281 Muscle weakness (generalized): Secondary | ICD-10-CM | POA: Diagnosis not present

## 2022-02-27 ENCOUNTER — Ambulatory Visit: Payer: Medicare Other | Admitting: Cardiovascular Disease

## 2022-02-27 DIAGNOSIS — M6281 Muscle weakness (generalized): Secondary | ICD-10-CM | POA: Diagnosis not present

## 2022-02-27 DIAGNOSIS — Z96642 Presence of left artificial hip joint: Secondary | ICD-10-CM | POA: Diagnosis not present

## 2022-03-02 DIAGNOSIS — M6281 Muscle weakness (generalized): Secondary | ICD-10-CM | POA: Diagnosis not present

## 2022-03-02 DIAGNOSIS — Z96642 Presence of left artificial hip joint: Secondary | ICD-10-CM | POA: Diagnosis not present

## 2022-03-04 ENCOUNTER — Ambulatory Visit: Payer: Medicare Other | Admitting: Cardiovascular Disease

## 2022-03-04 ENCOUNTER — Other Ambulatory Visit (HOSPITAL_COMMUNITY): Payer: Medicare Other

## 2022-03-04 DIAGNOSIS — Z96642 Presence of left artificial hip joint: Secondary | ICD-10-CM | POA: Diagnosis not present

## 2022-03-04 DIAGNOSIS — M6281 Muscle weakness (generalized): Secondary | ICD-10-CM | POA: Diagnosis not present

## 2022-03-05 ENCOUNTER — Encounter (HOSPITAL_COMMUNITY): Payer: Self-pay

## 2022-03-05 ENCOUNTER — Ambulatory Visit: Payer: Medicare Other | Admitting: Cardiovascular Disease

## 2022-03-05 ENCOUNTER — Telehealth: Payer: Self-pay | Admitting: Cardiovascular Disease

## 2022-03-05 ENCOUNTER — Ambulatory Visit (HOSPITAL_COMMUNITY): Payer: Medicare Other | Attending: Cardiology

## 2022-03-05 DIAGNOSIS — I480 Paroxysmal atrial fibrillation: Secondary | ICD-10-CM | POA: Diagnosis not present

## 2022-03-05 DIAGNOSIS — R82998 Other abnormal findings in urine: Secondary | ICD-10-CM | POA: Diagnosis not present

## 2022-03-05 LAB — ECHOCARDIOGRAM COMPLETE
Area-P 1/2: 2.99 cm2
S' Lateral: 3 cm

## 2022-03-05 NOTE — Telephone Encounter (Signed)
Called and discussed CT findings with Keith Hernandez. Concerning for myxoma. Will need cardiac MRI and CTA in anticipation for cardiac surgery. We will get him in office to discuss next week. No stroke-like symptoms.   [place] T. Audie Box, MD, [facility]  [address] [place], [place] [address] [phone]  5:08 PM

## 2022-03-06 DIAGNOSIS — Z96642 Presence of left artificial hip joint: Secondary | ICD-10-CM | POA: Diagnosis not present

## 2022-03-06 DIAGNOSIS — M6281 Muscle weakness (generalized): Secondary | ICD-10-CM | POA: Diagnosis not present

## 2022-03-06 NOTE — Telephone Encounter (Signed)
Called patient, he is scheduled to be seen on Monday 03/09/22. Patient verbalized understanding, thanks!

## 2022-03-09 ENCOUNTER — Ambulatory Visit: Payer: Medicare Other | Admitting: Cardiovascular Disease

## 2022-03-09 ENCOUNTER — Ambulatory Visit (INDEPENDENT_AMBULATORY_CARE_PROVIDER_SITE_OTHER): Payer: Medicare Other | Admitting: Cardiovascular Disease

## 2022-03-09 ENCOUNTER — Encounter: Payer: Self-pay | Admitting: Cardiovascular Disease

## 2022-03-09 ENCOUNTER — Encounter: Payer: Self-pay | Admitting: Orthopaedic Surgery

## 2022-03-09 ENCOUNTER — Ambulatory Visit (INDEPENDENT_AMBULATORY_CARE_PROVIDER_SITE_OTHER): Payer: Medicare Other | Admitting: Orthopaedic Surgery

## 2022-03-09 VITALS — BP 154/94 | HR 74 | Ht 71.0 in | Wt 212.6 lb

## 2022-03-09 DIAGNOSIS — I10 Essential (primary) hypertension: Secondary | ICD-10-CM

## 2022-03-09 DIAGNOSIS — D151 Benign neoplasm of heart: Secondary | ICD-10-CM | POA: Diagnosis not present

## 2022-03-09 DIAGNOSIS — Z96642 Presence of left artificial hip joint: Secondary | ICD-10-CM | POA: Diagnosis not present

## 2022-03-09 DIAGNOSIS — R9430 Abnormal result of cardiovascular function study, unspecified: Secondary | ICD-10-CM

## 2022-03-09 DIAGNOSIS — M6281 Muscle weakness (generalized): Secondary | ICD-10-CM | POA: Diagnosis not present

## 2022-03-09 DIAGNOSIS — I480 Paroxysmal atrial fibrillation: Secondary | ICD-10-CM

## 2022-03-09 DIAGNOSIS — M25552 Pain in left hip: Secondary | ICD-10-CM | POA: Diagnosis not present

## 2022-03-10 ENCOUNTER — Encounter: Payer: Self-pay | Admitting: Cardiovascular Disease

## 2022-03-11 ENCOUNTER — Ambulatory Visit: Payer: Medicare Other | Admitting: Cardiovascular Disease

## 2022-03-11 ENCOUNTER — Ambulatory Visit (HOSPITAL_COMMUNITY)
Admission: RE | Admit: 2022-03-11 | Discharge: 2022-03-11 | Disposition: A | Discharge status: Home / Self Care | Payer: Medicare Other | Source: Ambulatory Visit | Attending: Cardiovascular Disease | Admitting: Cardiovascular Disease

## 2022-03-11 DIAGNOSIS — E785 Hyperlipidemia, unspecified: Secondary | ICD-10-CM | POA: Diagnosis not present

## 2022-03-11 DIAGNOSIS — Z23 Encounter for immunization: Secondary | ICD-10-CM | POA: Diagnosis not present

## 2022-03-11 DIAGNOSIS — I2584 Coronary atherosclerosis due to calcified coronary lesion: Secondary | ICD-10-CM | POA: Diagnosis not present

## 2022-03-11 DIAGNOSIS — D151 Benign neoplasm of heart: Secondary | ICD-10-CM | POA: Diagnosis not present

## 2022-03-11 DIAGNOSIS — I714 Abdominal aortic aneurysm, without rupture, unspecified: Secondary | ICD-10-CM | POA: Diagnosis not present

## 2022-03-11 DIAGNOSIS — D692 Other nonthrombocytopenic purpura: Secondary | ICD-10-CM | POA: Diagnosis not present

## 2022-03-11 DIAGNOSIS — I4891 Unspecified atrial fibrillation: Secondary | ICD-10-CM | POA: Diagnosis not present

## 2022-03-11 DIAGNOSIS — Z1331 Encounter for screening for depression: Secondary | ICD-10-CM | POA: Diagnosis not present

## 2022-03-11 DIAGNOSIS — Z0000 Encounter for general adult medical examination without abnormal findings: Secondary | ICD-10-CM | POA: Diagnosis not present

## 2022-03-11 DIAGNOSIS — R9430 Abnormal result of cardiovascular function study, unspecified: Secondary | ICD-10-CM | POA: Insufficient documentation

## 2022-03-11 DIAGNOSIS — D6869 Other thrombophilia: Secondary | ICD-10-CM | POA: Diagnosis not present

## 2022-03-11 DIAGNOSIS — I10 Essential (primary) hypertension: Secondary | ICD-10-CM | POA: Diagnosis not present

## 2022-03-11 DIAGNOSIS — F419 Anxiety disorder, unspecified: Secondary | ICD-10-CM | POA: Diagnosis not present

## 2022-03-11 DIAGNOSIS — Z1339 Encounter for screening examination for other mental health and behavioral disorders: Secondary | ICD-10-CM | POA: Diagnosis not present

## 2022-03-11 MED ORDER — NITROGLYCERIN 0.4 MG SL SUBL
0.8000 mg | SUBLINGUAL_TABLET | Freq: Once | SUBLINGUAL | Status: AC
  Administered 2022-03-11: 0.8 mg via SUBLINGUAL

## 2022-03-11 MED ORDER — NITROGLYCERIN 0.4 MG SL SUBL
SUBLINGUAL_TABLET | SUBLINGUAL | Status: AC
  Filled 2022-03-11: qty 2

## 2022-03-11 MED ORDER — IOHEXOL 350 MG/ML SOLN
100.0000 mL | Freq: Once | INTRAVENOUS | Status: AC | PRN
  Administered 2022-03-11: 100 mL via INTRAVENOUS

## 2022-03-12 ENCOUNTER — Encounter: Payer: Self-pay | Admitting: Cardiovascular Disease

## 2022-03-25 ENCOUNTER — Telehealth (HOSPITAL_COMMUNITY): Payer: Self-pay | Admitting: *Deleted

## 2022-03-25 NOTE — Telephone Encounter (Signed)
Patient returning call regarding upcoming cardiac imaging study; pt verbalizes understanding of appt date/time, parking situation and where to check in, and verified current allergies; name and call back number provided for further questions should they arise  Roddy Bellamy RN Navigator Cardiac Imaging [facility] [phone] office [phone] cell  Patient denies metal or claustrophobia. 

## 2022-03-27 ENCOUNTER — Inpatient Hospital Stay (HOSPITAL_COMMUNITY): Admission: RE | Admit: 2022-03-27 | Payer: Medicare Other | Source: Ambulatory Visit

## 2022-03-27 ENCOUNTER — Ambulatory Visit (HOSPITAL_COMMUNITY)
Admission: RE | Admit: 2022-03-27 | Discharge: 2022-03-27 | Disposition: A | Discharge status: Home / Self Care | Payer: Medicare Other | Source: Ambulatory Visit | Attending: Cardiovascular Disease | Admitting: Cardiovascular Disease

## 2022-03-27 DIAGNOSIS — M6281 Muscle weakness (generalized): Secondary | ICD-10-CM | POA: Diagnosis not present

## 2022-03-27 DIAGNOSIS — Z96642 Presence of left artificial hip joint: Secondary | ICD-10-CM | POA: Diagnosis not present

## 2022-03-27 DIAGNOSIS — D151 Benign neoplasm of heart: Secondary | ICD-10-CM | POA: Insufficient documentation

## 2022-03-27 MED ORDER — GADOBUTROL 1 MMOL/ML IV SOLN
12.0000 mL | Freq: Once | INTRAVENOUS | Status: AC | PRN
  Administered 2022-03-27: 12 mL via INTRAVENOUS

## 2022-03-30 NOTE — Telephone Encounter (Signed)
Referral made and Dr. Aundra Millet assistant notifed.

## 2022-04-02 NOTE — Telephone Encounter (Signed)
The patient is scheduled for consult with Dr. Cheree Ditto 04/13/22.

## 2022-04-03 ENCOUNTER — Encounter: Payer: Medicare Other | Admitting: Thoracic Surgery (Cardiothoracic Vascular Surgery)

## 2022-04-03 ENCOUNTER — Institutional Professional Consult (permissible substitution) (INDEPENDENT_AMBULATORY_CARE_PROVIDER_SITE_OTHER): Payer: Medicare Other | Admitting: Surgery

## 2022-04-03 ENCOUNTER — Encounter: Payer: Medicare Other | Admitting: Surgery

## 2022-04-03 ENCOUNTER — Encounter: Payer: Self-pay | Admitting: Surgery

## 2022-04-03 VITALS — BP 160/89 | HR 78 | Resp 20 | Ht 71.0 in | Wt 212.0 lb

## 2022-04-03 DIAGNOSIS — I499 Cardiac arrhythmia, unspecified: Secondary | ICD-10-CM | POA: Insufficient documentation

## 2022-04-03 DIAGNOSIS — I2510 Atherosclerotic heart disease of native coronary artery without angina pectoris: Secondary | ICD-10-CM | POA: Insufficient documentation

## 2022-04-03 DIAGNOSIS — D151 Benign neoplasm of heart: Secondary | ICD-10-CM | POA: Diagnosis not present

## 2022-04-03 DIAGNOSIS — I4891 Unspecified atrial fibrillation: Secondary | ICD-10-CM | POA: Insufficient documentation

## 2022-04-03 DIAGNOSIS — I471 Supraventricular tachycardia: Secondary | ICD-10-CM | POA: Insufficient documentation

## 2022-04-03 NOTE — Progress Notes (Signed)
Cardiothoracic Surgery Consultation  PCP is Velna Hatchet, MD Referring Provider is Geralynn Rile, *  Chief Complaint  Patient presents with   Atrial Myxoma    Surgical consutl/ ECHO 03/05/22/Cardiac CT 03/11/22/MRI Cardiac 03/27/22    HPI:  The patient is a 71 year old gentleman with history of hypertension, hyperlipidemia, abdominal aortic aneurysm followed by Dr. Trula Slade, and paroxysmal atrial fibrillation which prompted a 2D echocardiogram.  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  The mitral valve is structurally normal with trivial MR.  He was referred to Dr. Cheree Ditto at [facility] for consideration of minimally invasive atrial myxoma resection and possible Maze procedure.  He requested to see me to discuss his studies and get another opinion.  He is scheduled to see Dr. Cheree Ditto in the next week or 2.  He continues to feel well without chest pain or shortness of breath.  He has had no neurologic symptoms or visual changes that might be attributable to the myxoma.   Past Medical History:  Diagnosis Date   AAA (abdominal aortic aneurysm) (HCC)    Arthritis    Bulging of cervical intervertebral disc    C6-7   Dupuytren's disease    GERD (gastroesophageal reflux disease)    OTC meds pnr   Hyperlipidemia    Hypertension    Migraines     Past Surgical History:  Procedure Laterality Date   achilles reattachment     FASCIOTOMY Left 10/11/2015   Procedure: LEFT HAND SUBTOTAL PALMAR FASCIOTOMY WITH REPAIR RECONSTRUCTION ;  Surgeon: Roseanne Kaufman, MD;  Location: [place];  Service: Orthopedics;  Laterality: Left;   LESION DESTRUCTION Bilateral 07/29/2017   Procedure: EXCISION LIP LESION;  Surgeon: [name], DO;  Location: [place];  Service: Plastics;  Laterality: Bilateral;   LIPOMA EXCISION Right 07/29/2017   Procedure: EXCISION BACK LIPOMA;  Surgeon:  [name], DO;  Location: [place];  Service: Plastics;  Laterality: Right;   TOTAL HIP ARTHROPLASTY Left 09/30/2018   Procedure: LEFT TOTAL HIP ARTHROPLASTY ANTERIOR APPROACH;  Surgeon: Mcarthur Rossetti, MD;  Location: [facility];  Service: Orthopedics;  Laterality: Left;    Family History  Problem Relation Age of Onset   Arthritis Mother    Heart disease Mother    Heart attack Mother    Asthma Father    Asthma Paternal Grandfather     Social History Social History   Tobacco Use   Smoking status: Former    Packs/day: 1.00    Years: 30.00    Total pack years: 30.00    Types: Cigarettes    Quit date: 07/15/1999    Years since quitting: 22.7   Smokeless tobacco: Never  Vaping Use   Vaping Use: Never used  Substance Use Topics   Alcohol use: Yes    Alcohol/week: 7.0 standard drinks of alcohol    Types: 5 Glasses of wine, 2 Shots of liquor per week    Comment: social   Drug use: No    Current Outpatient Medications  Medication Sig Dispense Refill   apixaban (ELIQUIS) 5 MG TABS tablet Take 1 tablet (5 mg total) by mouth 2 (two) times daily. 60 tablet 3   Cholecalciferol (VITAMIN D3 PO) Take 1 capsule by mouth daily.     metoprolol succinate (TOPROL XL) 25 MG 24 hr tablet Take 1 tablet (25 mg total) by mouth daily.  90 tablet 1   Multiple Vitamin (MULTIVITAMIN) tablet Take 1 tablet by mouth daily.     olmesartan (BENICAR) 40 MG tablet Take 20 mg by mouth 2 (two) times daily.      rosuvastatin (CRESTOR) 5 MG tablet TAKE 1 TABLET (5 MG TOTAL) BY MOUTH DAILY. 90 tablet 3   triamcinolone (NASACORT) 55 MCG/ACT AERO nasal inhaler Place 1 spray into the nose daily as needed (for congestion).     No current facility-administered medications for this visit.    Allergies  Allergen Reactions   Atorvastatin     Other reaction(s): myalgia    Review of Systems  Constitutional:  Negative for activity change and fatigue.  HENT:         Last dental  visit in April 2023  Eyes:        Floaters  Respiratory:  Negative for shortness of breath.   Cardiovascular:  Positive for palpitations. Negative for chest pain and leg swelling.  Gastrointestinal:        Frequent heartburn  Endocrine: Negative.   Genitourinary: Negative.   Musculoskeletal:  Positive for arthralgias.  Skin: Negative.   Neurological:  Negative for dizziness, syncope, speech difficulty and headaches.  Hematological: Negative.   Psychiatric/Behavioral: Negative.      BP (!) 160/89   Pulse 78   Resp 20   Ht '5\' 11"'$  (1.803 m)   Wt 212 lb (96.2 kg)   SpO2 97% Comment: RA  BMI 29.57 kg/m  Physical Exam Constitutional:      Appearance: Normal appearance. He is normal weight.  Neck:     Vascular: No carotid bruit.  Cardiovascular:     Rate and Rhythm: Normal rate and regular rhythm.     Pulses: Normal pulses.     Heart sounds: Normal heart sounds. No murmur heard. Pulmonary:     Effort: Pulmonary effort is normal.     Breath sounds: Normal breath sounds.  Musculoskeletal:        General: No swelling.  Skin:    General: Skin is warm and dry.  Neurological:     General: No focal deficit present.     Mental Status: He is alert and oriented to person, place, and time.  Psychiatric:        Mood and Affect: Mood normal.        Behavior: Behavior normal.      Diagnostic Tests:  ECHOCARDIOGRAM REPORT         Patient Name:   Keith Hernandez Date of Exam: 03/05/2022  Medical Rec #:  [id number]     Height:       71.0 in  Accession #:    [id number]    Weight:       213.0 lb  Date of Birth:  17-Sep-1950     BSA:          2.166 m  Patient Age:    78 years      BP:           133/93 mmHg  Patient Gender: M             HR:           65 bpm.  Exam Location:  [place]   Procedure: 2D Echo, 3D Echo, Cardiac Doppler, Color Doppler and Strain  Analysis   Indications:    I48 Atrial fibrillation     History:        Patient has no prior history of Echocardiogram  examinations.                  Arrythmias:Atrial Fibrillation; Risk Factors:Former  Smoker,                  Hypertension and Dyslipidemia. AAA.     Sonographer:    Basilia Jumbo BS, RDCS  Referring Phys: [id number] [place]     1. Global longitudinal strain is -24.6%. Left ventricular ejection  fraction, by estimation, is 60 to 65%. The left ventricle has normal  function. The left ventricle has no regional wall motion abnormalities.  There is mild left ventricular hypertrophy.  Left ventricular diastolic parameters were normal.   2. Right ventricular systolic function is normal. The right ventricular  size is normal.   3. Spherical mass is broadly attached to interatrial septum. Measures 27  x 18 mm. Consistent with myxoma. Recommend other imaging modality to  further define this and other portions of the septum. . Left atrial size  was moderately dilated.   4. The mitral valve is normal in structure. Trivial mitral valve  regurgitation.   5. The aortic valve is normal in structure. Aortic valve regurgitation is  not visualized.   6. Proximal ascending aorta is 39 mm.. Aortic dilatation noted. There is  mild dilatation of the aortic root, measuring 41 mm.   7. The inferior vena cava is normal in size with greater than 50%  respiratory variability, suggesting right atrial pressure of 3 mmHg.   FINDINGS   Left Ventricle: Global longitudinal strain is -24.6%. Left ventricular  ejection fraction, by estimation, is 60 to 65%. The left ventricle has  normal function. The left ventricle has no regional wall motion  abnormalities. The left ventricular internal  cavity size was normal in size. There is mild left ventricular  hypertrophy. Left ventricular diastolic parameters were normal.   Right Ventricle: The right ventricular size is normal. Right vetricular  wall thickness was not assessed. Right ventricular systolic function is  normal.   Left Atrium:  Spherical mass is broadly attached to interatrial septum.  Measures 27 x 18 mm. Consistent with myxoma. Recommend other imaging  modality to further define this and other portions of the septum. Left  atrial size was moderately dilated.   Right Atrium: Right atrial size was normal in size.   Pericardium: There is no evidence of pericardial effusion.   Mitral Valve: The mitral valve is normal in structure. Trivial mitral  valve regurgitation.   Tricuspid Valve: The tricuspid valve is normal in structure. Tricuspid  valve regurgitation is trivial.   Aortic Valve: The aortic valve is normal in structure. Aortic valve  regurgitation is not visualized.   Pulmonic Valve: The pulmonic valve was normal in structure. Pulmonic valve  regurgitation is not visualized.   Aorta: Proximal ascending aorta is 39 mm. Aortic dilatation noted. There  is mild dilatation of the aortic root, measuring 41 mm.   Venous: The inferior vena cava is normal in size with greater than 50%  respiratory variability, suggesting right atrial pressure of 3 mmHg.   IAS/Shunts: No atrial level shunt detected by color flow Doppler.      LEFT VENTRICLE  PLAX 2D  LVIDd:         4.20 cm   Diastology  LVIDs:         3.00 cm   LV e' medial:    7.87 cm/s  LV PW:  1.20 cm   LV E/e' medial:  8.3  LV IVS:        1.00 cm   LV e' lateral:   11.90 cm/s  LVOT diam:     2.40 cm   LV E/e' lateral: 5.5  LV SV:         112  LV SV Index:   52        2D Longitudinal Strain  LVOT Area:     4.52 cm  2D Strain GLS (A2C):   -22.4 %                           2D Strain GLS (A3C):   -19.6 %                           2D Strain GLS (A4C):   -31.8 %                           2D Strain GLS Avg:     -24.6 %                              3D Volume EF:                           3D EF:        66 %                           LV EDV:       144 ml                           LV ESV:       50 ml                           LV SV:        95 ml    RIGHT VENTRICLE             IVC  RV Basal diam:  3.20 cm     IVC diam: 1.60 cm  RV S prime:     12.30 cm/s  TAPSE (M-mode): 2.3 cm   LEFT ATRIUM             Index        RIGHT ATRIUM           Index  LA diam:        3.40 cm 1.57 cm/m   RA Area:     17.20 cm  LA Vol (A2C):   78.9 ml 36.43 ml/m  RA Volume:   45.80 ml  21.15 ml/m  LA Vol (A4C):   38.1 ml 17.59 ml/m  LA Biplane Vol: 56.4 ml 26.04 ml/m   AORTIC VALVE  LVOT Vmax:   106.00 cm/s  LVOT Vmean:  69.200 cm/s  LVOT VTI:    0.248 m     AORTA  Ao Root diam: 4.10 cm  Ao Asc diam:  3.90 cm   MITRAL VALVE                             SHUNTS  MV Decel  Time: 254 msec    Systemic VTI:  0.25 m  MV E velocity: 65.70 cm/s  Systemic Diam: 2.40 cm  MV A velocity: 69.40 cm/s  MV E/A ratio:  0.95   Dorris Carnes MD  Electronically signed by Dorris Carnes MD  Signature Date/Time: 03/05/2022/4:53:55 PM         Final     ADDENDUM REPORT: 03/11/2022 17:30   HISTORY: left atrial myxoma   EXAM: Cardiac/Coronary  CT   TECHNIQUE: The patient was scanned on a [employer].   PROTOCOL: A 120 kV prospective scan was triggered in the descending thoracic aorta at 111 HU's. Axial non-contrast 3 mm slices were carried out through the heart. The data set was analyzed on a dedicated work station and scored using the Agatston method. Gantry rotation speed was 250 msecs and collimation was .6 mm. Beta blockade and 0.8 mg of sl NTG was given. The 3D data set was reconstructed in 5% intervals of the 35-75 % of the R-R cycle. Systolic and diastolic phases were analyzed on a dedicated work station using MPR, MIP and VRT modes. The patient received contrast: 17m OMNIPAQUE IOHEXOL 350 MG/ML SOLN.   FINDINGS: Image quality: Good   Noise artifact is: Limited   Coronary calcium score is 70.5, which places the patient in the 36th percentile for age and sex matched control.   Coronary arteries: Normal coronary origins.  Right  dominance.   Right Coronary Artery: Minimal mixed atherosclerotic plaque in the proximal, mid and distal RCA, <25% stenosis. Large caliber patent PDA and PLA without significant plaque or stenosis, which supplies the entire posterior aspect of the heart.   Left Main Coronary Artery: No detectable plaque or stenosis.   Left Anterior Descending Coronary Artery: Minimal atherosclerotic plaque in the mid LAD, <25% stenosis, at the level of a trifurcation of the first large diagonal branch, second septal perforator and LAD. Minimal atherosclerotic plaque in the mid-distal LAD, <25% stenosis, followed by a segment of superficial myocardial bridging in the distal LAD.   Ramus intermedius: Small caliber vessel is patent without detectable plaque or stenosis.   Left Circumflex Artery: Minimal atherosclerotic plaque in the distal LCx, <25% stenosis, small caliber distal vessel.   Aorta: Mild dilation, 40 x 40 x 40 mm at the sinus of Valsalva. Borderline dilation 38 mm at mid ascending aorta (level of the PA bifurcation) measured double oblique. No calcifications. No dissection.   Aortic Valve: No calcifications.  Tricuspid aortic valve.   Other findings:   Normal pulmonary vein drainage into the left atrium.   Normal left atrial appendage without thrombus.   Normal size of the pulmonary artery.   Mobile mass adherent to the left atrial aspect of the atrial septum, most consistent with left atrial myxoma, 30 x 25 x 26.5 mm.   Possible small patent foramen ovale.   IMPRESSION: 1. Minimal CAD, <25% stenosis, CADRADS 1.   2. Coronary calcium score is 70.5, which places the patient in the 36th percentile for age and sex matched control.   3. Normal coronary origins with right dominance.   4. Mobile mass adherent to the left atrial aspect of the atrial septum, most consistent with left atrial myxoma, 30 x 25 x 26.5 mm.   5. Mild dilation of ascending aorta at sinus of Valsalva 40  mm.   RECOMMENDATIONS: CAD-RADS 1. Minimal non-obstructive CAD (0-24%). Consider preventive therapy and risk factor modification.     Electronically Signed   By: GNadean Corwin  Margaretann Loveless M.D.   On: 03/11/2022 17:30    Addended by Elouise Munroe, MD on 03/11/2022  5:33 PM   Study Result  Narrative & Impression  EXAM: OVER-READ INTERPRETATION  CT CHEST   The following report is a limited chest CT over-read performed by radiologist Dr. Yetta Glassman of [facility], [place] on 03/11/2022. This over-read does not include interpretation of cardiac or coronary anatomy or pathology. The cardiac CTA interpretation by the cardiologist is attached.   COMPARISON:  None Available.   FINDINGS: Vascular: Normal heart size. No pericardial effusion. Normal caliber thoracic aorta with mild atherosclerotic disease. Mass of the left atrium, see cardiac CTA interpretation for further discussion.   Mediastinum/Nodes: Patulous esophagus. No pathologically enlarged lymph nodes seen in the chest.   Lungs/Pleura: Central airways are patent. No consolidation, pleural effusion or pneumothorax.   Upper Abdomen: No acute abnormality.   Musculoskeletal: No chest wall mass or suspicious bone lesions identified.   IMPRESSION: No acute extracardiac abnormalities.   Electronically Signed: By: Yetta Glassman M.D. On: 03/11/2022 14:31      Narrative & Impression  CLINICAL DATA:  Left Atrial Mass   EXAM: CARDIAC MRI   TECHNIQUE: The patient was scanned on a 1.5 Tesla GE magnet. A dedicated cardiac coil was used. Functional imaging was done using [employer] sequences. 2,3, and 4 chamber views were done to assess for RWMA's. Modified Simpson's rule using a short axis stack was used to calculate an ejection fraction on a dedicated work Conservation officer, nature. The patient received 9 cc of Gadavist. After 10 minutes inversion recovery sequences were used to assess for infiltration and  scar tissue.   CONTRAST:  Gadavist   FINDINGS: Normal atrial sizes. No PFO/ASD. No LAA thrombus. No pericardial effusion. Normal ascending thoracic aorta 3.2 cm normal MV,AV, TV and PV. Large mobile mass in the LA   Attached to the fossa with a pedicle Bright on T2 imaging and isointense on T1 imaging Delayed gadolinium uptake in mass All of these   Confirm that the mass which measures 2.7 cm x 2.0 cm is most likely an atrial myxoma   Normal LV size and function Quantitative EF 58% (EDV 121 cc ESV 51 cc SV 70 cc )   Normal RV size and function Quantitative RVEF 49% (EDV 145 cc ESV 73 cc SV 71 cc )   IMPRESSION: 1. 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 Has characteristic bright T2 signal, isointense T1 signal and gadolinium uptake   2.  No PFO/ASD   3.  Normal cardiac valves   4.  No pericardial effusion   5.  Normal RV/LV function   Jenkins Rouge     Electronically Signed   By: Jenkins Rouge M.D.   On: 03/27/2022 16:39     Impression:  This 71 year old gentleman has a large left atrial myxoma found incidentally on 2D echocardiogram done for paroxysmal atrial fibrillation.  This will require surgical resection and would probably be most amenable to a minimally invasive right chest approach.  Maze procedure or some form of ablation would be reasonable at the same time to try to prevent further atrial fibrillation.  I reviewed the CT and echo studies with him and his wife and answered their questions.   Plan:  He has an appointment to see Dr. Cheree Ditto at [facility] in the near future.  I told him I would be happy to answer any further questions that  he has.  I spent 30 minutes performing this consultation and > 50% of this time was spent face to face counseling and coordinating the care of this patient's left atrial myxoma.  Gaye Pollack, MD [facility] [phone]

## 2022-04-06 DIAGNOSIS — Z96642 Presence of left artificial hip joint: Secondary | ICD-10-CM | POA: Diagnosis not present

## 2022-04-06 DIAGNOSIS — M6281 Muscle weakness (generalized): Secondary | ICD-10-CM | POA: Diagnosis not present

## 2022-04-13 ENCOUNTER — Encounter: Payer: Self-pay | Admitting: Orthopaedic Surgery

## 2022-04-13 DIAGNOSIS — I5189 Other ill-defined heart diseases: Secondary | ICD-10-CM | POA: Diagnosis not present

## 2022-04-23 NOTE — Telephone Encounter (Signed)
Received a call re leg muscle spasm. Associated w HTN. No cp, doe, palpitations, presyncope or syncope. Pt took benzos rx. Feels better. Reassurance provided

## 2022-04-24 ENCOUNTER — Encounter (HOSPITAL_BASED_OUTPATIENT_CLINIC_OR_DEPARTMENT_OTHER): Payer: Medicare Other | Admitting: Cardiology

## 2022-04-24 DIAGNOSIS — M6281 Muscle weakness (generalized): Secondary | ICD-10-CM | POA: Diagnosis not present

## 2022-04-24 DIAGNOSIS — Z96642 Presence of left artificial hip joint: Secondary | ICD-10-CM | POA: Diagnosis not present

## 2022-04-29 DIAGNOSIS — M6281 Muscle weakness (generalized): Secondary | ICD-10-CM | POA: Diagnosis not present

## 2022-04-29 DIAGNOSIS — Z96642 Presence of left artificial hip joint: Secondary | ICD-10-CM | POA: Diagnosis not present

## 2022-05-01 DIAGNOSIS — M6281 Muscle weakness (generalized): Secondary | ICD-10-CM | POA: Diagnosis not present

## 2022-05-01 DIAGNOSIS — Z96642 Presence of left artificial hip joint: Secondary | ICD-10-CM | POA: Diagnosis not present

## 2022-05-04 DIAGNOSIS — Z96642 Presence of left artificial hip joint: Secondary | ICD-10-CM | POA: Diagnosis not present

## 2022-05-04 DIAGNOSIS — M6281 Muscle weakness (generalized): Secondary | ICD-10-CM | POA: Diagnosis not present

## 2022-05-06 DIAGNOSIS — Z87891 Personal history of nicotine dependence: Secondary | ICD-10-CM | POA: Diagnosis not present

## 2022-05-06 DIAGNOSIS — Z7982 Long term (current) use of aspirin: Secondary | ICD-10-CM | POA: Diagnosis not present

## 2022-05-06 DIAGNOSIS — I480 Paroxysmal atrial fibrillation: Secondary | ICD-10-CM | POA: Diagnosis not present

## 2022-05-06 DIAGNOSIS — J939 Pneumothorax, unspecified: Secondary | ICD-10-CM | POA: Diagnosis not present

## 2022-05-06 DIAGNOSIS — Z452 Encounter for adjustment and management of vascular access device: Secondary | ICD-10-CM | POA: Diagnosis not present

## 2022-05-06 DIAGNOSIS — F419 Anxiety disorder, unspecified: Secondary | ICD-10-CM | POA: Insufficient documentation

## 2022-05-06 DIAGNOSIS — Z96642 Presence of left artificial hip joint: Secondary | ICD-10-CM | POA: Diagnosis not present

## 2022-05-06 DIAGNOSIS — D696 Thrombocytopenia, unspecified: Secondary | ICD-10-CM | POA: Diagnosis not present

## 2022-05-06 DIAGNOSIS — R918 Other nonspecific abnormal finding of lung field: Secondary | ICD-10-CM | POA: Diagnosis not present

## 2022-05-06 DIAGNOSIS — J90 Pleural effusion, not elsewhere classified: Secondary | ICD-10-CM | POA: Diagnosis not present

## 2022-05-06 DIAGNOSIS — D72828 Other elevated white blood cell count: Secondary | ICD-10-CM | POA: Diagnosis not present

## 2022-05-06 DIAGNOSIS — I5189 Other ill-defined heart diseases: Secondary | ICD-10-CM | POA: Diagnosis not present

## 2022-05-06 DIAGNOSIS — D62 Acute posthemorrhagic anemia: Secondary | ICD-10-CM | POA: Diagnosis not present

## 2022-05-06 DIAGNOSIS — J951 Acute pulmonary insufficiency following thoracic surgery: Secondary | ICD-10-CM | POA: Diagnosis not present

## 2022-05-06 DIAGNOSIS — I10 Essential (primary) hypertension: Secondary | ICD-10-CM | POA: Diagnosis not present

## 2022-05-06 DIAGNOSIS — Z4682 Encounter for fitting and adjustment of non-vascular catheter: Secondary | ICD-10-CM | POA: Diagnosis not present

## 2022-05-06 DIAGNOSIS — Z792 Long term (current) use of antibiotics: Secondary | ICD-10-CM | POA: Diagnosis not present

## 2022-05-06 DIAGNOSIS — J811 Chronic pulmonary edema: Secondary | ICD-10-CM | POA: Diagnosis not present

## 2022-05-06 DIAGNOSIS — I4891 Unspecified atrial fibrillation: Secondary | ICD-10-CM | POA: Diagnosis not present

## 2022-05-06 DIAGNOSIS — Z9981 Dependence on supplemental oxygen: Secondary | ICD-10-CM | POA: Diagnosis not present

## 2022-05-06 DIAGNOSIS — I519 Heart disease, unspecified: Secondary | ICD-10-CM | POA: Diagnosis not present

## 2022-05-06 DIAGNOSIS — Z85828 Personal history of other malignant neoplasm of skin: Secondary | ICD-10-CM | POA: Diagnosis not present

## 2022-05-06 DIAGNOSIS — D151 Benign neoplasm of heart: Secondary | ICD-10-CM | POA: Insufficient documentation

## 2022-05-06 DIAGNOSIS — I9581 Postprocedural hypotension: Secondary | ICD-10-CM | POA: Diagnosis not present

## 2022-05-06 DIAGNOSIS — Z9889 Other specified postprocedural states: Secondary | ICD-10-CM | POA: Diagnosis not present

## 2022-05-06 DIAGNOSIS — I951 Orthostatic hypotension: Secondary | ICD-10-CM | POA: Diagnosis not present

## 2022-05-06 DIAGNOSIS — K219 Gastro-esophageal reflux disease without esophagitis: Secondary | ICD-10-CM | POA: Diagnosis not present

## 2022-05-06 DIAGNOSIS — G8918 Other acute postprocedural pain: Secondary | ICD-10-CM | POA: Diagnosis not present

## 2022-05-06 DIAGNOSIS — I714 Abdominal aortic aneurysm, without rupture, unspecified: Secondary | ICD-10-CM | POA: Diagnosis not present

## 2022-05-06 DIAGNOSIS — J9811 Atelectasis: Secondary | ICD-10-CM | POA: Diagnosis not present

## 2022-05-06 DIAGNOSIS — Z8679 Personal history of other diseases of the circulatory system: Secondary | ICD-10-CM | POA: Diagnosis not present

## 2022-05-06 DIAGNOSIS — E785 Hyperlipidemia, unspecified: Secondary | ICD-10-CM | POA: Diagnosis not present

## 2022-05-06 DIAGNOSIS — Z7901 Long term (current) use of anticoagulants: Secondary | ICD-10-CM | POA: Diagnosis not present

## 2022-05-07 DIAGNOSIS — I5189 Other ill-defined heart diseases: Secondary | ICD-10-CM | POA: Insufficient documentation

## 2022-05-07 HISTORY — PX: MAZE: SHX5063

## 2022-05-07 HISTORY — PX: OTHER SURGICAL HISTORY: SHX169

## 2022-05-08 DIAGNOSIS — Z8679 Personal history of other diseases of the circulatory system: Secondary | ICD-10-CM | POA: Insufficient documentation

## 2022-05-08 DIAGNOSIS — Z9889 Other specified postprocedural states: Secondary | ICD-10-CM | POA: Insufficient documentation

## 2022-05-09 DIAGNOSIS — M62838 Other muscle spasm: Secondary | ICD-10-CM | POA: Insufficient documentation

## 2022-05-22 ENCOUNTER — Ambulatory Visit: Payer: Medicare Other | Admitting: Cardiovascular Disease

## 2022-05-25 ENCOUNTER — Ambulatory Visit: Payer: Medicare Other | Admitting: Cardiovascular Disease

## 2022-05-25 DIAGNOSIS — Z48812 Encounter for surgical aftercare following surgery on the circulatory system: Secondary | ICD-10-CM | POA: Diagnosis not present

## 2022-05-25 DIAGNOSIS — D151 Benign neoplasm of heart: Secondary | ICD-10-CM | POA: Diagnosis not present

## 2022-05-25 DIAGNOSIS — Z9889 Other specified postprocedural states: Secondary | ICD-10-CM | POA: Diagnosis not present

## 2022-05-25 DIAGNOSIS — Z8679 Personal history of other diseases of the circulatory system: Secondary | ICD-10-CM | POA: Diagnosis not present

## 2022-05-25 DIAGNOSIS — I5189 Other ill-defined heart diseases: Secondary | ICD-10-CM | POA: Diagnosis not present

## 2022-05-25 DIAGNOSIS — Z79899 Other long term (current) drug therapy: Secondary | ICD-10-CM | POA: Diagnosis not present

## 2022-05-25 DIAGNOSIS — R9431 Abnormal electrocardiogram [ECG] [EKG]: Secondary | ICD-10-CM | POA: Diagnosis not present

## 2022-05-25 DIAGNOSIS — J90 Pleural effusion, not elsewhere classified: Secondary | ICD-10-CM | POA: Diagnosis not present

## 2022-05-25 DIAGNOSIS — J9811 Atelectasis: Secondary | ICD-10-CM | POA: Diagnosis not present

## 2022-05-26 ENCOUNTER — Encounter: Payer: Self-pay | Admitting: Cardiovascular Disease

## 2022-05-26 ENCOUNTER — Other Ambulatory Visit: Payer: Self-pay | Admitting: Cardiovascular Disease

## 2022-05-26 DIAGNOSIS — I480 Paroxysmal atrial fibrillation: Secondary | ICD-10-CM

## 2022-05-26 NOTE — Telephone Encounter (Signed)
Eliquis '5mg'$  refill request received. Patient is 71 years old, weight-96.2kg, Crea- 1.2 on 05/25/2022, Diagnosis-Afib, and last seen by Dr. Audie Box on 03/09/2022. Dose is appropriate based on dosing criteria. Will send in refill to requested pharmacy.

## 2022-06-09 ENCOUNTER — Encounter: Payer: Self-pay | Admitting: Cardiovascular Disease

## 2022-06-09 ENCOUNTER — Telehealth: Payer: Self-pay | Admitting: Cardiovascular Disease

## 2022-06-09 ENCOUNTER — Other Ambulatory Visit: Payer: Self-pay

## 2022-06-09 DIAGNOSIS — I499 Cardiac arrhythmia, unspecified: Secondary | ICD-10-CM

## 2022-06-09 DIAGNOSIS — Z79899 Other long term (current) drug therapy: Secondary | ICD-10-CM

## 2022-06-09 MED ORDER — METOPROLOL SUCCINATE ER 25 MG PO TB24: 25.0000 mg | ORAL_TABLET | Freq: Two times a day (BID) | ORAL | 3 refills | Status: DC

## 2022-06-09 NOTE — Telephone Encounter (Addendum)
Patient informed or Dr. Kathalene Frames recommendation to take metoprolol succinate '25mg'$  twice daily. Patient will contact us if this does not help with palpitations. Medication list updated.

## 2022-06-09 NOTE — Telephone Encounter (Signed)
Patient returned Judson Roch RN's call.

## 2022-06-09 NOTE — Telephone Encounter (Signed)
Patient stated he has more frequent runs of afib. He denies sob or lightheadedness. He took an early dose of metoprolol this AM and the afib stopped. He is open to taking a second dose of metoprolol during the day. Recommended that if he has sob, lightheadedness, or dizziness, to be brought to the ED. He voiced understanding. He has an appointment with Arnold Long on //29. Please advise on met succ. extra dosing.

## 2022-06-11 NOTE — Progress Notes (Signed)
Cardiology Clinic Note   Patient Name: Keith Hernandez Date of Encounter: 06/12/2022  Primary Care Provider:  Velna Hatchet, MD Primary Cardiologist:  Evalina Field, MD  Patient Profile    71 year old male with history of PAF CHADS VASC 2 (Age HTN), , left atrial myxoma., s/p left atrial mass excision and MAZE via thoracotomy 05/07/2022. , AAA (-3.2 cm) hyperlipidemia,hypertension, GERD, Migraines, anxiety.  Past Medical History    Past Medical History:  Diagnosis Date   AAA (abdominal aortic aneurysm) (HCC)    Arthritis    Bulging of cervical intervertebral disc    C6-7   Dupuytren's disease    GERD (gastroesophageal reflux disease)    OTC meds pnr   Hyperlipidemia    Hypertension    Migraines    Past Surgical History:  Procedure Laterality Date   achilles reattachment     FASCIOTOMY Left 10/11/2015   Procedure: LEFT HAND SUBTOTAL PALMAR FASCIOTOMY WITH REPAIR RECONSTRUCTION ;  Surgeon: Roseanne Kaufman, MD;  Location: [facility];  Service: Orthopedics;  Laterality: Left;   Left Atrial Mass Excision  Left 05/07/2022   [facility] Dr. Cheree Ditto   LESION DESTRUCTION Bilateral 07/29/2017   Procedure: EXCISION LIP LESION;  Surgeon: [name], DO;  Location: [facility];  Service: Plastics;  Laterality: Bilateral;   LIPOMA EXCISION Right 07/29/2017   Procedure: EXCISION BACK LIPOMA;  Surgeon: [name], DO;  Location: [facility];  Service: Plastics;  Laterality: Right;   MAZE Left 05/07/2022   [facility]   TOTAL HIP ARTHROPLASTY Left 09/30/2018   Procedure: LEFT TOTAL HIP ARTHROPLASTY ANTERIOR APPROACH;  Surgeon: Mcarthur Rossetti, MD;  Location: [facility];  Service: Orthopedics;  Laterality: Left;    Allergies  Allergies  Allergen Reactions   Pollen Extract     Other reaction(s): Other (See Comments) Nasal drainage, itchy eyes   Atorvastatin     Other reaction(s): myalgia    History of Present Illness     Keith Hernandez is a very pleasant 71 year old male who returns today with his wife, who is a retired cardiac ICU RN, for ongoing assessment and management of PAF, s/p surgical excision of LA myxoma and MAZE operation for atrial fibrillation by Dr.Jeffery Gaca at [facility] 05/07/2022. Was seen on post op follow up by Dr.Gaca on 05/25/2022.Continued on Eliquis and ASA.    Called our office on 06/09/2022 with complaints of more frequent runs of atrial fib. Was given instructions by Dr.O'Neal to take metoprolol succinate 25 mg BID.   He has been meticulous concerning blood pressure readings, weight, and heart rate documentation.  He has documented paroxysms of atrial fibrillation via his Kardia monitor, and is very cardiac aware concerning recurrent irregular heart rhythms.  On 06/08/2022 the patient had 2 hours of irregular heart rhythm which began around 2 AM.  After beginning metoprolol 25 mg twice daily, the following evening he had no recurrence, however on Wednesday, 06/11/2022 he had 10 minutes of every occurrence of irregular heart rhythm lasting approximately 10 minutes.    He is asymptomatic with this other than being very aware that it is occurring.  Irregular heart rate stops on its own without any intervention.  He has also recorded his heart rate which is averaging between 89 and 97 bpm at home.  This does correlate with heart rate here in the office at rest.  He denies any symptoms of bleeding, melena, epistaxis.  He is medically compliant.  He has  been released from [facility] to our practice for ongoing follow-up.  Home Medications    Current Outpatient Medications  Medication Sig Dispense Refill   aspirin 81 MG chewable tablet Chew by mouth.     Cholecalciferol (VITAMIN D3 PO) Take 1 capsule by mouth daily.     LORazepam (ATIVAN) 0.5 MG tablet Take 0.5 mg by mouth 2 (two) times daily as needed.     Multiple Vitamin (MULTIVITAMIN) tablet Take 1 tablet by mouth daily.      rosuvastatin (CRESTOR) 5 MG tablet TAKE 1 TABLET (5 MG TOTAL) BY MOUTH DAILY. 90 tablet 3   triamcinolone (NASACORT) 55 MCG/ACT AERO nasal inhaler Place 1 spray into the nose daily as needed (for congestion).     apixaban (ELIQUIS) 5 MG TABS tablet Take 1 tablet (5 mg total) by mouth 2 (two) times daily. 180 tablet 3   metoprolol succinate (TOPROL XL) 25 MG 24 hr tablet Take 1.5 tablets (37.'5mg'$ ) in the morning and 1 tablet ('25mg'$ ) at bedtime. You may take an additional 0.5 tablet as needed if your heart rate is elevated for more than 30 minutes. 225 tablet 1   No current facility-administered medications for this visit.     Family History    Family History  Problem Relation Age of Onset   Arthritis Mother    Heart disease Mother    Heart attack Mother    Asthma Father    Asthma Paternal Grandfather    He indicated that his mother is deceased. He indicated that his father is deceased. He indicated that the status of his paternal grandfather is unknown.  Social History    Social History   Socioeconomic History   Marital status: Married    Spouse name: Not on file   Number of children: 0   Years of education: Not on file   Highest education level: Not on file  Occupational History   Occupation: retired  Tobacco Use   Smoking status: Former    Packs/day: 1.00    Years: 30.00    Total pack years: 30.00    Types: Cigarettes    Quit date: 07/15/1999    Years since quitting: 22.9   Smokeless tobacco: Never  Vaping Use   Vaping Use: Never used  Substance and Sexual Activity   Alcohol use: Yes    Alcohol/week: 7.0 standard drinks of alcohol    Types: 5 Glasses of wine, 2 Shots of liquor per week    Comment: social   Drug use: No   Sexual activity: Not on file  Other Topics Concern   Not on file  Social History Narrative   Not on file   Social Determinants of Health   Financial Resource Strain: Not on file  Food Insecurity: Not on file  Transportation Needs: Not on file   Physical Activity: Not on file  Stress: Not on file  Social Connections: Not on file  Intimate Partner Violence: Not on file     Review of Systems    General:  No chills, fever, night sweats or weight changes.  Cardiovascular:  No chest pain, dyspnea on exertion, edema, orthopnea, palpitations, paroxysmal nocturnal dyspnea. Dermatological: No rash, lesions/masses.  Respiratory: No cough, dyspnea Urologic: No hematuria, dysuria Abdominal:   No nausea, vomiting, diarrhea, bright red blood per rectum, melena, or hematemesis Neurologic:  No visual changes, wkns, changes in mental status. All other systems reviewed and are otherwise negative except as noted above.     Physical  Exam    VS:  BP (!) 136/90   Pulse 87   Ht 5' 11.5" (1.816 m)   Wt 199 lb 6.4 oz (90.4 kg)   SpO2 96%   BMI 27.42 kg/m  , BMI Body mass index is 27.42 kg/m.     GEN: Well nourished, well developed, in no acute distress. HEENT: normal. Neck: Supple, no JVD, carotid bruits, or masses. Cardiac: RRR, no murmurs, rubs, or gallops. No clubbing, cyanosis, edema.  Radials/DP/PT 2+ and equal bilaterally.  Respiratory:  Respirations regular and unlabored, clear to auscultation bilaterally.  Mildly diminished in the right base without crackles or wheezing. GI: Soft, nontender, nondistended, BS + x 4. MS: no deformity or atrophy.Well-healed incisions on the right lateral chest, and chest wall. Skin: warm and dry, no rash. Neuro:  Strength and sensation are intact. Psych: Normal affect.  Accessory Clinical Findings    ECG personally reviewed by me today-normal sinus rhythm, heart rate 87 bpm- No acute changes  Lab Results  Component Value Date   WBC 10.9 (H) 10/03/2018   HGB 11.4 (L) 10/03/2018   HCT 35.5 (L) 10/03/2018   MCV 90.8 10/03/2018   PLT 141 (L) 10/03/2018   Lab Results  Component Value Date   CREATININE 1.00 10/03/2018   BUN 12 10/03/2018   NA 136 10/03/2018   K 3.5 10/03/2018   CL 104  10/03/2018   CO2 24 10/03/2018   No results found for: "ALT", "AST", "GGT", "ALKPHOS", "BILITOT" No results found for: "CHOL", "HDL", "LDLCALC", "LDLDIRECT", "TRIG", "CHOLHDL"  No results found for: "HGBA1C"  Review of Prior Studies: TTE 03/05/2022  1. Global longitudinal strain is -24.6%. Left ventricular ejection  fraction, by estimation, is 60 to 65%. The left ventricle has normal  function. The left ventricle has no regional wall motion abnormalities.  There is mild left ventricular hypertrophy.  Left ventricular diastolic parameters were normal.   2. Right ventricular systolic function is normal. The right ventricular  size is normal.   3. Spherical mass is broadly attached to interatrial septum. Measures 27  x 18 mm. Consistent with myxoma. Recommend other imaging modality to  further define this and other portions of the septum. . Left atrial size  was moderately dilated.   4. The mitral valve is normal in structure. Trivial mitral valve  regurgitation.   5. The aortic valve is normal in structure. Aortic valve regurgitation is  not visualized.   6. Proximal ascending aorta is 39 mm.. Aortic dilatation noted. There is  mild dilatation of the aortic root, measuring 41 mm.   7. The inferior vena cava is normal in size with greater than 50%  respiratory variability, suggesting right atrial pressure of 3 mmHg.    Cardiac CTA 03/11/2022 IMPRESSION: 1. Minimal CAD, <25% stenosis, CADRADS 1.   2. Coronary calcium score is 70.5, which places the patient in the 36th percentile for age and sex matched control.   3. Normal coronary origins with right dominance.   4. Mobile mass adherent to the left atrial aspect of the atrial septum, most consistent with left atrial myxoma, 30 x 25 x 26.5 mm.   5. Mild dilation of ascending aorta at sinus of Valsalva 40 mm.  Cardiac MRI  IMPRESSION: 1. 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 Has  characteristic bright T2 signal, isointense T1 signal and gadolinium uptake   2.  No PFO/ASD   3.  Normal cardiac valves  4.  No pericardial effusion   5.  Normal RV/LV function   [place] [place]  Assessment & Plan   1.  Paroxysmal atrial fibrillation: He is status post Maze procedure, continues on Eliquis and aspirin.  We will stop aspirin in 1 month as he is on DOAC.  Due to resting heart rate which remains in the high 80s to 90s, I will increase metoprolol to 37.5 mg in the morning, and continue metoprolol 25 mg in the evening, for better cardiac output.  He is to continue to follow blood pressure and heart rate recordings, and document this for follow-up appointment.  If patient has recurrent atrial fibrillation lasting greater than 30 minutes he may take an additional 12.5 mg of metoprolol.  If this continues he is to report to ED.  He verbalizes understanding  2.  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  He is status post atrial myxoma excision.  He has finished his course of Lasix.  Healing well.  Does continue to use incentive spirometer, and has some mildly diminished breath sounds in the right base.  Would repeat chest x-ray on follow-up appointment.  3.  Hyperlipidemia: Goal of LDL less than 100.  He is currently on Crestor 5 mg daily.  Will need repeat fasting lipids and LFTs if not completed by PCP.  4.  Hypertension: Blood pressures currently well controlled.  Continue to monitor this with increased dose of metoprolol for a.m. dosing.  Current medicines are reviewed at length with the patient today.  I have spent 45 min's  dedicated to the care of this patient on the date of this encounter to include pre-visit review of records, assessment, management and diagnostic testing,with shared decision making and questions answered.  Signed, Phill Myron. West Pugh, ANP, AACC   06/12/2022 10:59 AM      Office [phone] Fax [phone]  Notice: This dictation was  prepared with Dragon dictation along with smaller phrase technology. Any transcriptional errors that result from this process are unintentional and may not be corrected upon review.

## 2022-06-12 ENCOUNTER — Encounter: Payer: Self-pay | Admitting: Adult Health

## 2022-06-12 ENCOUNTER — Ambulatory Visit: Payer: Medicare Other | Attending: Adult Health | Admitting: Adult Health

## 2022-06-12 VITALS — BP 136/90 | HR 87 | Ht 71.5 in | Wt 199.4 lb

## 2022-06-12 DIAGNOSIS — E7800 Pure hypercholesterolemia, unspecified: Secondary | ICD-10-CM

## 2022-06-12 DIAGNOSIS — I10 Essential (primary) hypertension: Secondary | ICD-10-CM | POA: Diagnosis not present

## 2022-06-12 DIAGNOSIS — I480 Paroxysmal atrial fibrillation: Secondary | ICD-10-CM

## 2022-06-12 DIAGNOSIS — D151 Benign neoplasm of heart: Secondary | ICD-10-CM

## 2022-06-12 DIAGNOSIS — Z9889 Other specified postprocedural states: Secondary | ICD-10-CM

## 2022-06-12 DIAGNOSIS — Z48812 Encounter for surgical aftercare following surgery on the circulatory system: Secondary | ICD-10-CM

## 2022-06-12 MED ORDER — METOPROLOL SUCCINATE ER 25 MG PO TB24: ORAL_TABLET | ORAL | 1 refills | Status: DC

## 2022-06-12 MED ORDER — APIXABAN 5 MG PO TABS: 5.0000 mg | ORAL_TABLET | Freq: Two times a day (BID) | ORAL | 3 refills | Status: DC

## 2022-06-12 NOTE — Patient Instructions (Signed)
Medication Instructions:  Your physician has recommended you make the following change in your medication:  INCREASE: Metoprolol 1.5 tablets (37.'5mg'$ ) in the AM and 1 tablet ('25mg'$ ) at night. May take 0.5 tablet as needed if your heart rate is elevated for more than 30 minutes.   *If you need a refill on your cardiac medications before your next appointment, please call your pharmacy*   Lab Work: NONE ordered at this time of appointment  If you have labs (blood work) drawn today and your tests are completely normal, you will receive your results only by: [place] (if you have MyChart) OR A paper copy in the mail If you have any lab test that is abnormal or we need to change your treatment, we will call you to review the results.   Testing/Procedures: NONE ordered at this time of appointment     Follow-Up: At [facility], you and your health needs are our priority.  As part of our continuing mission to provide you with exceptional heart care, we have created designated Provider Care Teams.  These Care Teams include your primary Cardiologist (physician) and Advanced Practice Providers (APPs -  Physician Assistants and Nurse Practitioners) who all work together to provide you with the care you need, when you need it.  We recommend signing up for the patient portal called "MyChart".  Sign up information is provided on this After Visit Summary.  MyChart is used to connect with patients for Virtual Visits (Telemedicine).  Patients are able to view lab/test results, encounter notes, upcoming appointments, etc.  Non-urgent messages can be sent to your provider as well.   To learn more about what you can do with MyChart, go to [email or web address].    Your next appointment:   1 month(s)  The format for your next appointment:   In Person  Provider:   Evalina Field, MD or Jory Sims, DNP, ANP

## 2022-06-15 ENCOUNTER — Telehealth: Payer: Self-pay | Admitting: Home Health

## 2022-06-15 NOTE — Telephone Encounter (Signed)
Patient called after hour line reporting he has been in and out A fib over the past week, felt this has worsened today, that he is having frequent A fib episodes, had heart rate between 80-120s, episodes last minutes and resolve spontaneously. His BP is 140/100. He feels heart flutter sensation, denied any chest pain, SOB, dizziness, syncope. He had taken metoprolol 37.5 mg this morning, 12.'5mg'$  at 3pm, and 12.'5mg'$  7pm today so far. Felt this did not make significant changes. Advised patient to take additional metoprolol '25mg'$  now, monitor if symptoms improve, if so, may increase metoprolol to 37.'5mg'$  BID going forward. If no improvement of symptoms, call back tomorrow, will need an appointment with A fib clinic. Advised patient go to ER if having chest pain, SOB, dizziness, syncope, or persistent A fib with RVR >130s. He is agreeable with above plan. He is compliant with Eliquis.

## 2022-06-17 ENCOUNTER — Other Ambulatory Visit: Payer: Self-pay

## 2022-06-17 DIAGNOSIS — Z79899 Other long term (current) drug therapy: Secondary | ICD-10-CM

## 2022-06-17 MED ORDER — FLECAINIDE ACETATE 50 MG PO TABS: 50.0000 mg | ORAL_TABLET | Freq: Two times a day (BID) | ORAL | 1 refills | Status: DC

## 2022-06-17 MED ORDER — METOPROLOL SUCCINATE ER 50 MG PO TB24: 50.0000 mg | ORAL_TABLET | Freq: Two times a day (BID) | ORAL | 1 refills | Status: DC

## 2022-06-17 NOTE — Telephone Encounter (Addendum)
Spoke with patient to inform him of new orders:  Take metoprolol succinate '50mg'$  twice daily Take flecainide '50mg'$  twice daily Get blood work this Friday for BMET, CBC, TSH Patient verbalized understanding. His BP from yesterday was 130/85. He stated he did not feel palpitations last night or this morning. He will continue to keep a diary of his BP and P. Orders placed.

## 2022-06-18 ENCOUNTER — Other Ambulatory Visit: Payer: Self-pay

## 2022-06-18 DIAGNOSIS — I480 Paroxysmal atrial fibrillation: Secondary | ICD-10-CM

## 2022-06-18 NOTE — Telephone Encounter (Signed)
ECHO ordered.   Patient made aware- they will call him to get scheduled.   Will send to scheduling.

## 2022-06-18 NOTE — Telephone Encounter (Signed)
Scheduled for ECHO on 10/09.  Thanks!

## 2022-06-18 NOTE — Telephone Encounter (Signed)
I attempted to contact patient to discuss medication changes.   Left call back number.

## 2022-06-19 ENCOUNTER — Encounter: Payer: Self-pay | Admitting: Cardiovascular Disease

## 2022-06-19 DIAGNOSIS — Z79899 Other long term (current) drug therapy: Secondary | ICD-10-CM | POA: Diagnosis not present

## 2022-06-19 NOTE — Telephone Encounter (Signed)
Called patient, scheduled to see Dr.O'Neal 10/19 at 4:00 PM.  This was the only option we had for scheduling.

## 2022-06-20 LAB — BASIC METABOLIC PANEL
BUN/Creatinine Ratio: 11 (ref 10–24)
BUN: 12 mg/dL (ref 8–27)
CO2: 25 mmol/L (ref 20–29)
Calcium: 9.4 mg/dL (ref 8.6–10.2)
Chloride: 101 mmol/L (ref 96–106)
Creatinine, Ser: 1.11 mg/dL (ref 0.76–1.27)
Glucose: 100 mg/dL — ABNORMAL HIGH (ref 70–99)
Potassium: 5 mmol/L (ref 3.5–5.2)
Sodium: 140 mmol/L (ref 134–144)
eGFR: 71 mL/min/{1.73_m2} (ref >59)

## 2022-06-20 LAB — CBC
Hematocrit: 40.2 % (ref 37.5–51.0)
Hemoglobin: 13.2 g/dL (ref 13.0–17.7)
MCH: 29.1 pg (ref 26.6–33.0)
MCHC: 32.8 g/dL (ref 31.5–35.7)
MCV: 89 fL (ref 79–97)
Platelets: 191 10*3/uL (ref 150–450)
RBC: 4.54 x10E6/uL (ref 4.14–5.80)
RDW: 13.1 % (ref 11.6–15.4)
WBC: 7.1 10*3/uL (ref 3.4–10.8)

## 2022-06-20 LAB — TSH: TSH: 1.15 u[IU]/mL (ref 0.450–4.500)

## 2022-06-22 ENCOUNTER — Ambulatory Visit (HOSPITAL_COMMUNITY)
Admission: RE | Admit: 2022-06-22 | Discharge: 2022-06-22 | Disposition: A | Discharge status: Home / Self Care | Payer: Medicare Other | Source: Ambulatory Visit | Attending: Cardiovascular Disease | Admitting: Cardiovascular Disease

## 2022-06-22 DIAGNOSIS — E785 Hyperlipidemia, unspecified: Secondary | ICD-10-CM | POA: Insufficient documentation

## 2022-06-22 DIAGNOSIS — I2510 Atherosclerotic heart disease of native coronary artery without angina pectoris: Secondary | ICD-10-CM | POA: Insufficient documentation

## 2022-06-22 DIAGNOSIS — F17200 Nicotine dependence, unspecified, uncomplicated: Secondary | ICD-10-CM | POA: Insufficient documentation

## 2022-06-22 DIAGNOSIS — I340 Nonrheumatic mitral (valve) insufficiency: Secondary | ICD-10-CM | POA: Diagnosis not present

## 2022-06-22 DIAGNOSIS — I480 Paroxysmal atrial fibrillation: Secondary | ICD-10-CM | POA: Insufficient documentation

## 2022-06-22 DIAGNOSIS — I10 Essential (primary) hypertension: Secondary | ICD-10-CM | POA: Insufficient documentation

## 2022-06-22 DIAGNOSIS — I77819 Aortic ectasia, unspecified site: Secondary | ICD-10-CM | POA: Diagnosis not present

## 2022-06-22 LAB — ECHOCARDIOGRAM COMPLETE
Area-P 1/2: 2.8 cm2
S' Lateral: 3.2 cm

## 2022-06-24 ENCOUNTER — Telehealth: Payer: Self-pay

## 2022-06-24 NOTE — Telephone Encounter (Addendum)
Called patient regarding results. Patient had understanding of results.----- Message from Lendon Colonel, NP sent at 06/20/2022 11:08 AM EDT ----- Labs reviewed. All of essentially normal. Continue meds including flecainide. See Dr Audie Box on follow up.    KL

## 2022-06-30 ENCOUNTER — Encounter: Payer: Self-pay | Admitting: Surgery

## 2022-07-01 NOTE — Progress Notes (Unsigned)
Cardiology Office Note:   Date:  07/02/2022  NAME:  Keith Hernandez    MRN: [id number] DOB:  Jul 31, 1951   PCP:  Velna Hatchet, MD  Cardiologist:  Evalina Field, MD  Electrophysiologist:  None   Referring MD: Velna Hatchet, MD   Chief Complaint  Patient presents with   Follow-up    History of Present Illness:   Keith Hernandez is a 71 y.o. male with a hx of left atrial myxoma status postresection, paroxysmal atrial fibrillation status post surgical PVI, hypertension, hyperlipidemia who presents for follow-up.  He was having difficulties with atrial fibrillation after his recent left atrial myxoma resection.  He also underwent surgical pulmonary vein isolation.  We started him on flecainide.  He may notice palpitations 1-2 times per day.  Symptoms last seconds.  I have reviewed his cardia mobile which demonstrate PACs.  No documented atrial fibrillation.  Blood pressure well controlled.  Denies any major symptoms in office today.  Plan to continue flecainide and complete a exercise treadmill stress test.  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 Paroxysmal Afib -surgical PVI 8/24/82023 3. Coronary calcium 11 (20th percentile) -<25% stenosis CCTA 03/11/2022 4. HLD -Total cholesterol 177, HDL 49, LDL 108, triglycerides 101 5. HTN 6. AAA -3.2 cm   Past Medical History: Past Medical History:  Diagnosis Date   AAA (abdominal aortic aneurysm) ([facility])    Arthritis    Bulging of cervical intervertebral disc    C6-7   Dupuytren's disease    GERD (gastroesophageal reflux disease)    OTC meds pnr   Hyperlipidemia    Hypertension    Migraines     Past Surgical History: Past Surgical History:  Procedure Laterality Date   achilles reattachment     FASCIOTOMY Left 10/11/2015   Procedure: LEFT HAND SUBTOTAL PALMAR FASCIOTOMY WITH REPAIR RECONSTRUCTION ;  Surgeon: Roseanne Kaufman, MD;  Location: [place];  Service: Orthopedics;   Laterality: Left;   Left Atrial Mass Excision  Left 05/07/2022   [facility] Dr. Cheree Ditto   LESION DESTRUCTION Bilateral 07/29/2017   Procedure: EXCISION LIP LESION;  Surgeon: [name], DO;  Location: [place];  Service: Plastics;  Laterality: Bilateral;   LIPOMA EXCISION Right 07/29/2017   Procedure: EXCISION BACK LIPOMA;  Surgeon: [name], DO;  Location: [place];  Service: Plastics;  Laterality: Right;   MAZE Left 05/07/2022   [place]   TOTAL HIP ARTHROPLASTY Left 09/30/2018   Procedure: LEFT TOTAL HIP ARTHROPLASTY ANTERIOR APPROACH;  Surgeon: Mcarthur Rossetti, MD;  Location: [facility];  Service: Orthopedics;  Laterality: Left;    Current Medications: Current Meds  Medication Sig   apixaban (ELIQUIS) 5 MG TABS tablet Take 1 tablet (5 mg total) by mouth 2 (two) times daily.   aspirin 81 MG chewable tablet Chew by mouth.   Cholecalciferol (VITAMIN D3 PO) Take 1 capsule by mouth daily.   flecainide (TAMBOCOR) 50 MG tablet Take 1 tablet (50 mg total) by mouth 2 (two) times daily.   LORazepam (ATIVAN) 0.5 MG tablet Take 0.5 mg by mouth 2 (two) times daily as needed.   metoprolol succinate (TOPROL-XL) 50 MG 24 hr tablet Take 1 tablet (50 mg total) by mouth in the morning and at bedtime. Take with or immediately following a meal.   Multiple Vitamin (MULTIVITAMIN) tablet Take 1 tablet by mouth daily.   rosuvastatin (CRESTOR) 5 MG tablet TAKE 1 TABLET (5  MG TOTAL) BY MOUTH DAILY.   triamcinolone (NASACORT) 55 MCG/ACT AERO nasal inhaler Place 1 spray into the nose daily as needed (for congestion).     Allergies:    Pollen extract and Atorvastatin   Social History: Social History   Socioeconomic History   Marital status: Married    Spouse name: Not on file   Number of children: 0   Years of education: Not on file   Highest education level: Not on file  Occupational History   Occupation: retired  Tobacco Use   Smoking status:  Former    Packs/day: 1.00    Years: 30.00    Total pack years: 30.00    Types: Cigarettes    Quit date: 07/15/1999    Years since quitting: 22.9   Smokeless tobacco: Never  Vaping Use   Vaping Use: Never used  Substance and Sexual Activity   Alcohol use: Yes    Alcohol/week: 7.0 standard drinks of alcohol    Types: 5 Glasses of wine, 2 Shots of liquor per week    Comment: social   Drug use: No   Sexual activity: Not on file  Other Topics Concern   Not on file  Social History Narrative   Not on file   Social Determinants of Health   Financial Resource Strain: Not on file  Food Insecurity: Not on file  Transportation Needs: Not on file  Physical Activity: Not on file  Stress: Not on file  Social Connections: Not on file     Family History: The patient's family history includes Arthritis in his mother; Asthma in his father and paternal grandfather; Heart attack in his mother; Heart disease in his mother.  ROS:   All other ROS reviewed and negative. Pertinent positives noted in the HPI.     EKGs/Labs/Other Studies Reviewed:   The following studies were personally reviewed by me today:  EKG:  EKG is ordered today.  The ekg ordered today demonstrates normal sinus rhythm heart 72, no acute ischemic changes or evidence of infarction, and was personally reviewed by me.   Recent Labs: 06/19/2022: BUN 12; Creatinine, Ser 1.11; Hemoglobin 13.2; Platelets 191; Potassium 5.0; Sodium 140; TSH 1.150   Recent Lipid Panel No results found for: "CHOL", "TRIG", "HDL", "CHOLHDL", "VLDL", "LDLCALC", "LDLDIRECT"  Physical Exam:   VS:  BP 134/84 (BP Location: Left Arm, Patient Position: Sitting)   Pulse 72   Wt 197 lb 12.8 oz (89.7 kg)   SpO2 98%   BMI 27.20 kg/m    Wt Readings from Last 3 Encounters:  07/02/22 197 lb 12.8 oz (89.7 kg)  06/12/22 199 lb 6.4 oz (90.4 kg)  04/03/22 212 lb (96.2 kg)    General: Well nourished, well developed, in no acute distress Head: Atraumatic,  normal size  Eyes: PEERLA, EOMI  Neck: Supple, no JVD Endocrine: No thryomegaly Cardiac: Normal S1, S2; RRR; no murmurs, rubs, or gallops Lungs: Clear to auscultation bilaterally, no wheezing, rhonchi or rales  Abd: Soft, nontender, no hepatomegaly  Ext: No edema, pulses 2+ Musculoskeletal: No deformities, BUE and BLE strength normal and equal Skin: Warm and dry, no rashes   Neuro: Alert and oriented to person, place, time, and situation, CNII-XII grossly intact, no focal deficits  Psych: Normal mood and affect   ASSESSMENT:   Keith Hernandez is a 71 y.o. male who presents for the following: 1. Paroxysmal atrial fibrillation (HCC)   2. H/O maze procedure   3. Atrial myxoma   4. Essential  hypertension   5. Hypercholesterolemia     PLAN:   1. Paroxysmal atrial fibrillation (HCC) 2. H/O maze procedure -History of paroxysmal atrial fibrillation.  Status post surgical PVI at the time of left atrial myxoma resection.  Was having increased A-fib episodes.  Started on flecainide 50 mg twice daily.  Also on metoprolol succinate 25 mg daily.  He is on Eliquis as well.  Symptoms are improving with flecainide.  We will continue at current dose.  He will obtain an exercise treadmill stress test to exclude exercise-induced arrhythmias.  3. Atrial myxoma -Status post resection doing well  4. Essential hypertension -Continue current regimen  5. Hypercholesterolemia -Lipids at goal   Shared Decision Making/Informed Consent The risks [chest pain, shortness of breath, cardiac arrhythmias, dizziness, blood pressure fluctuations, myocardial infarction, stroke/transient ischemic attack, and life-threatening complications (estimated to be 1 in 10,000)], benefits (risk stratification, diagnosing coronary artery disease, treatment guidance) and alternatives of an exercise tolerance test were discussed in detail with Keith Hernandez and he agrees to proceed.  Disposition: Return in about 6 months (around  01/01/2023).  Medication Adjustments/Labs and Tests Ordered: Current medicines are reviewed at length with the patient today.  Concerns regarding medicines are outlined above.  Orders Placed This Encounter  Procedures   Cardiac Stress Test: Informed Consent Details: Physician/Practitioner Attestation; Transcribe to consent form and obtain patient signature   EXERCISE TOLERANCE TEST (ETT)   EKG 12-Lead   No orders of the defined types were placed in this encounter.   Patient Instructions  Medication Instructions:  The current medical regimen is effective;  continue present plan and medications.  *If you need a refill on your cardiac medications before your next appointment, please call your pharmacy*   Testing:  Your physician has requested that you have an exercise tolerance test, this is a screening tool to track your fitness level. This test evaluates the your exercise capacity by measuring cardiovascular response to exercise, the stress response is induced by exercise (exercise-treadmill).  Graded exercise test is also known as maximal exercise test or stress EKG test  . Please also follow instruction sheet given.   Follow-Up: At [facility], you and your health needs are our priority.  As part of our continuing mission to provide you with exceptional heart care, we have created designated Provider Care Teams.  These Care Teams include your primary Cardiologist (physician) and Advanced Practice Providers (APPs -  Physician Assistants and Nurse Practitioners) who all work together to provide you with the care you need, when you need it.  We recommend signing up for the patient portal called "MyChart".  Sign up information is provided on this After Visit Summary.  MyChart is used to connect with patients for Virtual Visits (Telemedicine).  Patients are able to view lab/test results, encounter notes, upcoming appointments, etc.  Non-urgent messages can be sent to your provider as  well.   To learn more about what you can do with MyChart, go to [email or web address].    Your next appointment:   6 month(s)  The format for your next appointment:   In Person  Provider:   Evalina Field, MD             Time Spent with Patient: I have spent a total of 35 minutes with patient reviewing hospital notes, telemetry, EKGs, labs and examining the patient as well as establishing an assessment and plan that was discussed with the patient.  > 50% of time was spent in direct  patient care.  Signed, Addison Naegeli. Audie Box, MD, [place]  [address] [place], [place] [address] [phone]  07/02/2022 5:50 PM

## 2022-07-02 ENCOUNTER — Encounter: Payer: Self-pay | Admitting: Cardiovascular Disease

## 2022-07-02 ENCOUNTER — Ambulatory Visit: Payer: Medicare Other | Attending: Cardiovascular Disease | Admitting: Cardiovascular Disease

## 2022-07-02 VITALS — BP 134/84 | HR 72 | Wt 197.8 lb

## 2022-07-02 DIAGNOSIS — Z9889 Other specified postprocedural states: Secondary | ICD-10-CM

## 2022-07-02 DIAGNOSIS — I480 Paroxysmal atrial fibrillation: Secondary | ICD-10-CM | POA: Diagnosis not present

## 2022-07-02 DIAGNOSIS — E7800 Pure hypercholesterolemia, unspecified: Secondary | ICD-10-CM | POA: Diagnosis not present

## 2022-07-02 DIAGNOSIS — D151 Benign neoplasm of heart: Secondary | ICD-10-CM | POA: Diagnosis not present

## 2022-07-02 DIAGNOSIS — I10 Essential (primary) hypertension: Secondary | ICD-10-CM

## 2022-07-02 NOTE — Patient Instructions (Addendum)
Medication Instructions:  The current medical regimen is effective;  continue present plan and medications.  *If you need a refill on your cardiac medications before your next appointment, please call your pharmacy*   Testing:  Your physician has requested that you have an exercise tolerance test, this is a screening tool to track your fitness level. This test evaluates the your exercise capacity by measuring cardiovascular response to exercise, the stress response is induced by exercise (exercise-treadmill).  Graded exercise test is also known as maximal exercise test or stress EKG test  . Please also follow instruction sheet given.   Follow-Up: At [facility], you and your health needs are our priority.  As part of our continuing mission to provide you with exceptional heart care, we have created designated Provider Care Teams.  These Care Teams include your primary Cardiologist (physician) and Advanced Practice Providers (APPs -  Physician Assistants and Nurse Practitioners) who all work together to provide you with the care you need, when you need it.  We recommend signing up for the patient portal called "MyChart".  Sign up information is provided on this After Visit Summary.  MyChart is used to connect with patients for Virtual Visits (Telemedicine).  Patients are able to view lab/test results, encounter notes, upcoming appointments, etc.  Non-urgent messages can be sent to your provider as well.   To learn more about what you can do with MyChart, go to [email or web address].    Your next appointment:   6 month(s)  The format for your next appointment:   In Person  Provider:   Evalina Field, MD

## 2022-07-09 ENCOUNTER — Other Ambulatory Visit: Payer: Self-pay | Admitting: Cardiovascular Disease

## 2022-07-11 ENCOUNTER — Other Ambulatory Visit: Payer: Self-pay | Admitting: Cardiovascular Disease

## 2022-07-15 ENCOUNTER — Ambulatory Visit (HOSPITAL_BASED_OUTPATIENT_CLINIC_OR_DEPARTMENT_OTHER): Payer: Medicare Other | Attending: Cardiovascular Disease | Admitting: Cardiology

## 2022-07-15 VITALS — Ht 72.0 in | Wt 190.0 lb

## 2022-07-15 DIAGNOSIS — G4733 Obstructive sleep apnea (adult) (pediatric): Secondary | ICD-10-CM | POA: Diagnosis not present

## 2022-07-15 DIAGNOSIS — R5383 Other fatigue: Secondary | ICD-10-CM | POA: Diagnosis not present

## 2022-07-15 DIAGNOSIS — R0683 Snoring: Secondary | ICD-10-CM | POA: Diagnosis not present

## 2022-07-17 ENCOUNTER — Ambulatory Visit: Payer: Medicare Other | Admitting: Adult Health

## 2022-07-18 NOTE — Procedures (Signed)
   Patient Name: Keith Hernandez, Keith Hernandez Date: 07/15/2022 Gender: Male D.O.B: 1951/04/16 Age (years): 71 Referring Provider: Cassie Freer ONeal Height (inches): 71 Interpreting Physician: Fransico Him MD, ABSM Weight (lbs): 190 RPSGT: Neeriemer, Holly BMI: 26 MRN: [id number] Neck Size: 17.00  CLINICAL INFORMATION Sleep Study Type: HST  Indication for sleep study: N/A  Epworth Sleepiness Score: 7  SLEEP STUDY TECHNIQUE A multi-channel overnight portable sleep study was performed. The channels recorded were: nasal airflow, thoracic respiratory movement, and oxygen saturation with a pulse oximetry. Snoring was also monitored.  MEDICATIONS Patient self administered medications include: N/A.  SLEEP ARCHITECTURE Patient was studied for 377.5 minutes. The sleep efficiency was 100.0 % and the patient was supine for 0%. The arousal index was 0.0 per hour.  RESPIRATORY PARAMETERS The overall AHI was 8.4 per hour, with a central apnea index of 0 per hour.  The oxygen nadir was 89% during sleep.  CARDIAC DATA Mean heart rate during sleep was 63.9 bpm.  IMPRESSIONS - Mild obstructive sleep apnea occurred during this study (AHI = 8.4/h). - Mild oxygen desaturation was noted during this study (Min O2 = 89%). - Patient snored 14.1% during the sleep.  DIAGNOSIS - Obstructive Sleep Apnea (G47.33)  RECOMMENDATIONS - Therapeutic CPAP titration to determine optimal pressure required to alleviate sleep disordered breathing. - Oral appliance may be considered. - Avoid alcohol, sedatives and other CNS depressants that may worsen sleep apnea and disrupt normal sleep architecture. - Sleep hygiene should be reviewed to assess factors that may improve sleep quality. - Weight management and regular exercise should be initiated or continued. - Return to Sleep Center in 6 weeks.  [Electronically signed] 07/18/2022 06:07 PM  Fransico Him MD, ABSM Diplomate, American Board of Sleep Medicine

## 2022-07-22 ENCOUNTER — Telehealth: Payer: Self-pay | Admitting: *Deleted

## 2022-07-22 NOTE — Telephone Encounter (Signed)
-----   Message from Lauralee Evener, [place] sent at 07/20/2022  9:38 AM EST -----  ----- Message ----- From: Sueanne Margarita, MD Sent: 07/18/2022   6:09 PM EST To: Cv Div Sleep Studies  Please let patient know that they have sleep apnea and recommend treating with CPAP.  Please order an auto CPAP from 4-15cm H2O with heated humidity and mask of choice.  Order overnight pulse ox on CPAP.  Followup with me in 6 weeks.

## 2022-07-22 NOTE — Telephone Encounter (Signed)
The patient has been notified of the result and verbalized understanding.  All questions (if any) were answered. Keith Hernandez, [place] 07/22/2022 11:58 AM    Patient would like to think about his result before making his decision and call our office back.

## 2022-07-26 DIAGNOSIS — Z23 Encounter for immunization: Secondary | ICD-10-CM | POA: Diagnosis not present

## 2022-07-28 ENCOUNTER — Ambulatory Visit: Payer: Medicare Other | Attending: Cardiovascular Disease

## 2022-07-28 DIAGNOSIS — I480 Paroxysmal atrial fibrillation: Secondary | ICD-10-CM | POA: Diagnosis not present

## 2022-07-29 LAB — EXERCISE TOLERANCE TEST
Angina Index: 0
Duke Treadmill Score: 5
Estimated workload: 7
Exercise duration (min): 5 min
Exercise duration (sec): 0 s
MPHR: 148 {beats}/min
Peak HR: 109 {beats}/min
Percent HR: 73 %
RPE: 17
Rest HR: 68 {beats}/min
ST Depression (mm): 0 mm

## 2022-08-04 ENCOUNTER — Other Ambulatory Visit: Payer: Self-pay

## 2022-08-04 DIAGNOSIS — I7143 Infrarenal abdominal aortic aneurysm, without rupture: Secondary | ICD-10-CM

## 2022-08-06 ENCOUNTER — Other Ambulatory Visit: Payer: Self-pay | Admitting: Cardiovascular Disease

## 2022-08-11 ENCOUNTER — Encounter: Payer: Self-pay | Admitting: Orthopaedic Surgery

## 2022-08-12 ENCOUNTER — Other Ambulatory Visit: Payer: Self-pay

## 2022-08-12 DIAGNOSIS — Z96642 Presence of left artificial hip joint: Secondary | ICD-10-CM

## 2022-08-19 ENCOUNTER — Ambulatory Visit (HOSPITAL_COMMUNITY)
Admission: RE | Admit: 2022-08-19 | Discharge: 2022-08-19 | Disposition: A | Discharge status: Home / Self Care | Payer: Medicare Other | Source: Ambulatory Visit | Attending: Surgery | Admitting: Surgery

## 2022-08-19 DIAGNOSIS — I7143 Infrarenal abdominal aortic aneurysm, without rupture: Secondary | ICD-10-CM | POA: Diagnosis not present

## 2022-08-19 MED ORDER — IOHEXOL 350 MG/ML SOLN
75.0000 mL | Freq: Once | INTRAVENOUS | Status: AC | PRN
  Administered 2022-08-19: 75 mL via INTRAVENOUS

## 2022-08-24 ENCOUNTER — Encounter: Payer: Self-pay | Admitting: Surgery

## 2022-08-24 ENCOUNTER — Ambulatory Visit: Payer: Medicare Other | Admitting: Cardiovascular Disease

## 2022-08-24 ENCOUNTER — Ambulatory Visit (INDEPENDENT_AMBULATORY_CARE_PROVIDER_SITE_OTHER): Payer: Medicare Other | Admitting: Surgery

## 2022-08-24 VITALS — BP 150/92 | HR 68 | Temp 98.2°F | Resp 20 | Ht 72.0 in | Wt 199.0 lb

## 2022-08-24 DIAGNOSIS — I7143 Infrarenal abdominal aortic aneurysm, without rupture: Secondary | ICD-10-CM | POA: Diagnosis not present

## 2022-08-24 NOTE — Progress Notes (Signed)
[facility] [employer]  Patient name: Keith Hernandez MRN: [id number] DOB: 1951-09-01 Sex: male   REASON FOR VISIT:    Follow up  HISOTRY OF PRESENT ILLNESS:    Keith Hernandez is a 71 y.o. male who returns today for follow-up of a abdominal aortic aneurysm.  He is back with a CT scan because of concerns regarding accuracy of prior ultrasounds given his tortuous aorta.  Since I last saw him, he has undergone surgery for a myxoma at [facility].  He is recovering from this.  He has had a persistently elevated right hemidiaphragm  He is medically managed for hypertension with an ARB. He takes a statin for hypercholesterolemia 3 times a week. He is a former smoker.  PAST MEDICAL HISTORY:   Past Medical History:  Diagnosis Date   AAA (abdominal aortic aneurysm) (HCC)    Arthritis    Bulging of cervical intervertebral disc    C6-7   Dupuytren's disease    GERD (gastroesophageal reflux disease)    OTC meds pnr   Hyperlipidemia    Hypertension    Migraines      FAMILY HISTORY:   Family History  Problem Relation Age of Onset   Arthritis Mother    Heart disease Mother    Heart attack Mother    Asthma Father    Asthma Paternal Grandfather     SOCIAL HISTORY:   Social History   Tobacco Use   Smoking status: Former    Packs/day: 1.00    Years: 30.00    Total pack years: 30.00    Types: Cigarettes    Quit date: 07/15/1999    Years since quitting: 23.1   Smokeless tobacco: Never  Substance Use Topics   Alcohol use: Yes    Alcohol/week: 7.0 standard drinks of alcohol    Types: 5 Glasses of wine, 2 Shots of liquor per week    Comment: social     ALLERGIES:   Allergies  Allergen Reactions   Pollen Extract     Other reaction(s): Other (See Comments) Nasal drainage, itchy eyes   Atorvastatin     Other reaction(s): myalgia     CURRENT MEDICATIONS:   Current Outpatient Medications  Medication Sig Dispense Refill    apixaban (ELIQUIS) 5 MG TABS tablet Take 1 tablet (5 mg total) by mouth 2 (two) times daily. 180 tablet 3   Cholecalciferol (VITAMIN D3 PO) Take 1 capsule by mouth daily.     flecainide (TAMBOCOR) 50 MG tablet TAKE 1 TABLET BY MOUTH TWICE A DAY 60 tablet 5   LORazepam (ATIVAN) 0.5 MG tablet Take 0.5 mg by mouth 2 (two) times daily as needed.     metoprolol succinate (TOPROL-XL) 50 MG 24 hr tablet Take 1 tablet (50 mg total) by mouth in the morning and at bedtime. Take with or immediately following a meal. 180 tablet 1   Multiple Vitamin (MULTIVITAMIN) tablet Take 1 tablet by mouth daily.     rosuvastatin (CRESTOR) 5 MG tablet TAKE 1 TABLET (5 MG TOTAL) BY MOUTH DAILY. 90 tablet 3   triamcinolone (NASACORT) 55 MCG/ACT AERO nasal inhaler Place 1 spray into the nose daily as needed (for congestion).     No current facility-administered medications for this visit.    REVIEW OF SYSTEMS:   '[X]'$  denotes positive finding, '[ ]'$  denotes negative finding Cardiac  Comments:  Chest pain or chest pressure:    Shortness of breath upon exertion:    Short of  breath when lying flat:    Irregular heart rhythm:        Vascular    Pain in calf, thigh, or hip brought on by ambulation:    Pain in feet at night that wakes you up from your sleep:     Blood clot in your veins:    Leg swelling:         Pulmonary    Oxygen at home:    Productive cough:     Wheezing:         Neurologic    Sudden weakness in arms or legs:     Sudden numbness in arms or legs:     Sudden onset of difficulty speaking or slurred speech:    Temporary loss of vision in one eye:     Problems with dizziness:         Gastrointestinal    Blood in stool:     Vomited blood:         Genitourinary    Burning when urinating:     Blood in urine:        Psychiatric    Major depression:         Hematologic    Bleeding problems:    Problems with blood clotting too easily:        Skin    Rashes or ulcers:         Constitutional    Fever or chills:      PHYSICAL EXAM:   Vitals:   08/24/22 1014  BP: (!) 150/92  Pulse: 68  Resp: 20  Temp: 98.2 F (36.8 C)  SpO2: 96%  Weight: 199 lb (90.3 kg)  Height: 6' (1.829 m)    GENERAL: The patient is a well-nourished male, in no acute distress. The vital signs are documented above. CARDIAC: There is a regular rate and rhythm.  VASCULAR: Palpable pedal pulses PULMONARY: Non-labored respirations ABDOMEN: Soft and non-tender  MUSCULOSKELETAL: There are no major deformities or cyanosis. NEUROLOGIC: No focal weakness or paresthesias are detected. SKIN: There are no ulcers or rashes noted. PSYCHIATRIC: The patient has a normal affect.  STUDIES:   I have reviewed his CT scan with the following findings: 1. Bilobed infrarenal abdominal aortic aneurysm measuring up to 3.7 cm (previously 2.9 cm) just proximal to the bifurcation. Recommend follow-up ultrasound every 2 years. This recommendation follows ACR consensus guidelines: White Paper of the ACR Incidental Findings Committee II on Vascular Findings. J Am Coll Radiol 2013; 10:789-794. 2.  Aortic Atherosclerosis (ICD10-I70.0).   NON-VASCULAR   1. Right bibasilar cicatricial atelectasis and elevation of the right hemidiaphragm, new from comparison. Dedicated chest CT could be considered for further characterization. 2. Prostatomegaly. 3. Diverticulosis.  MEDICAL ISSUES:   AAA: Maximum diameter on CT scan is 3.7 cm.  He is without abdominal pain.  I recommended getting another CT scan in 18 months.  Ultrasound has proven not to be a reliable study for accurate diameter measurements.    Leia Alf, MD, FACS [facility] [phone] Pager [phone]

## 2022-09-02 ENCOUNTER — Other Ambulatory Visit: Payer: Self-pay | Admitting: Cardiovascular Disease

## 2022-09-02 DIAGNOSIS — E782 Mixed hyperlipidemia: Secondary | ICD-10-CM

## 2022-09-03 DIAGNOSIS — M25652 Stiffness of left hip, not elsewhere classified: Secondary | ICD-10-CM | POA: Diagnosis not present

## 2022-09-03 DIAGNOSIS — M6281 Muscle weakness (generalized): Secondary | ICD-10-CM | POA: Diagnosis not present

## 2022-09-03 DIAGNOSIS — Z96642 Presence of left artificial hip joint: Secondary | ICD-10-CM | POA: Diagnosis not present

## 2022-09-09 ENCOUNTER — Encounter: Payer: Self-pay | Admitting: Orthopaedic Surgery

## 2022-09-23 ENCOUNTER — Encounter: Payer: Self-pay | Admitting: Cardiovascular Disease

## 2022-09-23 DIAGNOSIS — M25652 Stiffness of left hip, not elsewhere classified: Secondary | ICD-10-CM | POA: Diagnosis not present

## 2022-09-23 DIAGNOSIS — Z96642 Presence of left artificial hip joint: Secondary | ICD-10-CM | POA: Diagnosis not present

## 2022-09-23 DIAGNOSIS — M6281 Muscle weakness (generalized): Secondary | ICD-10-CM | POA: Diagnosis not present

## 2022-09-23 NOTE — Telephone Encounter (Signed)
Patein stated he has been taking eliquis '5mg'$  twice daily and not tacking ASA. He wanted to know how long to be on eliquis. Six-month f/u for May 1 with Dr. Audie Box to discuss meds as well.

## 2022-09-25 DIAGNOSIS — M6281 Muscle weakness (generalized): Secondary | ICD-10-CM | POA: Diagnosis not present

## 2022-09-25 DIAGNOSIS — Z96642 Presence of left artificial hip joint: Secondary | ICD-10-CM | POA: Diagnosis not present

## 2022-09-25 DIAGNOSIS — M25652 Stiffness of left hip, not elsewhere classified: Secondary | ICD-10-CM | POA: Diagnosis not present

## 2022-09-29 DIAGNOSIS — M6281 Muscle weakness (generalized): Secondary | ICD-10-CM | POA: Diagnosis not present

## 2022-09-29 DIAGNOSIS — Z96642 Presence of left artificial hip joint: Secondary | ICD-10-CM | POA: Diagnosis not present

## 2022-09-29 DIAGNOSIS — M25652 Stiffness of left hip, not elsewhere classified: Secondary | ICD-10-CM | POA: Diagnosis not present

## 2022-10-01 DIAGNOSIS — Z96642 Presence of left artificial hip joint: Secondary | ICD-10-CM | POA: Diagnosis not present

## 2022-10-01 DIAGNOSIS — M6281 Muscle weakness (generalized): Secondary | ICD-10-CM | POA: Diagnosis not present

## 2022-10-01 DIAGNOSIS — M25652 Stiffness of left hip, not elsewhere classified: Secondary | ICD-10-CM | POA: Diagnosis not present

## 2022-10-05 DIAGNOSIS — Z96642 Presence of left artificial hip joint: Secondary | ICD-10-CM | POA: Diagnosis not present

## 2022-10-05 DIAGNOSIS — M6281 Muscle weakness (generalized): Secondary | ICD-10-CM | POA: Diagnosis not present

## 2022-10-05 DIAGNOSIS — M25652 Stiffness of left hip, not elsewhere classified: Secondary | ICD-10-CM | POA: Diagnosis not present

## 2022-10-07 DIAGNOSIS — Z96642 Presence of left artificial hip joint: Secondary | ICD-10-CM | POA: Diagnosis not present

## 2022-10-07 DIAGNOSIS — M6281 Muscle weakness (generalized): Secondary | ICD-10-CM | POA: Diagnosis not present

## 2022-10-07 DIAGNOSIS — M25652 Stiffness of left hip, not elsewhere classified: Secondary | ICD-10-CM | POA: Diagnosis not present

## 2022-10-08 ENCOUNTER — Other Ambulatory Visit: Payer: Self-pay

## 2022-10-08 ENCOUNTER — Telehealth: Payer: Self-pay | Admitting: Internal Medicine

## 2022-10-08 DIAGNOSIS — I499 Cardiac arrhythmia, unspecified: Secondary | ICD-10-CM

## 2022-10-08 MED ORDER — METOPROLOL SUCCINATE ER 50 MG PO TB24: 50.0000 mg | ORAL_TABLET | Freq: Two times a day (BID) | ORAL | 1 refills | Status: DC

## 2022-10-08 NOTE — Telephone Encounter (Signed)
Seen in recovery [facility] - wife is my patient He asks that I do his next colonoscopy which is coming up sometime soon.  I told him I would accept him as a patient  for his colonoscopy

## 2022-10-12 DIAGNOSIS — Z96642 Presence of left artificial hip joint: Secondary | ICD-10-CM | POA: Diagnosis not present

## 2022-10-12 DIAGNOSIS — M6281 Muscle weakness (generalized): Secondary | ICD-10-CM | POA: Diagnosis not present

## 2022-10-12 DIAGNOSIS — M25652 Stiffness of left hip, not elsewhere classified: Secondary | ICD-10-CM | POA: Diagnosis not present

## 2022-10-13 DIAGNOSIS — K080 Exfoliation of teeth due to systemic causes: Secondary | ICD-10-CM | POA: Diagnosis not present

## 2022-10-14 ENCOUNTER — Encounter: Payer: Self-pay | Admitting: Internal Medicine

## 2022-10-14 DIAGNOSIS — M6281 Muscle weakness (generalized): Secondary | ICD-10-CM | POA: Diagnosis not present

## 2022-10-14 DIAGNOSIS — Z96642 Presence of left artificial hip joint: Secondary | ICD-10-CM | POA: Diagnosis not present

## 2022-10-14 DIAGNOSIS — M25652 Stiffness of left hip, not elsewhere classified: Secondary | ICD-10-CM | POA: Diagnosis not present

## 2022-10-19 DIAGNOSIS — M25652 Stiffness of left hip, not elsewhere classified: Secondary | ICD-10-CM | POA: Diagnosis not present

## 2022-10-19 DIAGNOSIS — Z96642 Presence of left artificial hip joint: Secondary | ICD-10-CM | POA: Diagnosis not present

## 2022-10-19 DIAGNOSIS — M6281 Muscle weakness (generalized): Secondary | ICD-10-CM | POA: Diagnosis not present

## 2022-10-22 DIAGNOSIS — Z96642 Presence of left artificial hip joint: Secondary | ICD-10-CM | POA: Diagnosis not present

## 2022-10-22 DIAGNOSIS — M25652 Stiffness of left hip, not elsewhere classified: Secondary | ICD-10-CM | POA: Diagnosis not present

## 2022-10-22 DIAGNOSIS — M6281 Muscle weakness (generalized): Secondary | ICD-10-CM | POA: Diagnosis not present

## 2022-10-26 DIAGNOSIS — M6281 Muscle weakness (generalized): Secondary | ICD-10-CM | POA: Diagnosis not present

## 2022-10-26 DIAGNOSIS — Z96642 Presence of left artificial hip joint: Secondary | ICD-10-CM | POA: Diagnosis not present

## 2022-10-26 DIAGNOSIS — M25652 Stiffness of left hip, not elsewhere classified: Secondary | ICD-10-CM | POA: Diagnosis not present

## 2022-10-29 ENCOUNTER — Encounter: Payer: Self-pay | Admitting: Cardiovascular Disease

## 2022-10-30 DIAGNOSIS — Z96642 Presence of left artificial hip joint: Secondary | ICD-10-CM | POA: Diagnosis not present

## 2022-10-30 DIAGNOSIS — M6281 Muscle weakness (generalized): Secondary | ICD-10-CM | POA: Diagnosis not present

## 2022-10-30 DIAGNOSIS — M25652 Stiffness of left hip, not elsewhere classified: Secondary | ICD-10-CM | POA: Diagnosis not present

## 2022-11-02 ENCOUNTER — Encounter: Payer: Self-pay | Admitting: Orthopaedic Surgery

## 2022-11-05 ENCOUNTER — Encounter: Payer: Self-pay | Admitting: Orthopaedic Surgery

## 2022-11-19 DIAGNOSIS — M25652 Stiffness of left hip, not elsewhere classified: Secondary | ICD-10-CM | POA: Diagnosis not present

## 2022-11-19 DIAGNOSIS — M6281 Muscle weakness (generalized): Secondary | ICD-10-CM | POA: Diagnosis not present

## 2022-11-19 DIAGNOSIS — Z96642 Presence of left artificial hip joint: Secondary | ICD-10-CM | POA: Diagnosis not present

## 2022-11-24 DIAGNOSIS — M25652 Stiffness of left hip, not elsewhere classified: Secondary | ICD-10-CM | POA: Diagnosis not present

## 2022-11-24 DIAGNOSIS — M6281 Muscle weakness (generalized): Secondary | ICD-10-CM | POA: Diagnosis not present

## 2022-11-24 DIAGNOSIS — Z96642 Presence of left artificial hip joint: Secondary | ICD-10-CM | POA: Diagnosis not present

## 2022-11-26 ENCOUNTER — Other Ambulatory Visit: Payer: Self-pay | Admitting: Adult Health

## 2022-11-26 DIAGNOSIS — I499 Cardiac arrhythmia, unspecified: Secondary | ICD-10-CM

## 2022-11-27 DIAGNOSIS — Z96642 Presence of left artificial hip joint: Secondary | ICD-10-CM | POA: Diagnosis not present

## 2022-11-27 DIAGNOSIS — M6281 Muscle weakness (generalized): Secondary | ICD-10-CM | POA: Diagnosis not present

## 2022-11-27 DIAGNOSIS — M25652 Stiffness of left hip, not elsewhere classified: Secondary | ICD-10-CM | POA: Diagnosis not present

## 2022-12-16 ENCOUNTER — Ambulatory Visit (INDEPENDENT_AMBULATORY_CARE_PROVIDER_SITE_OTHER): Payer: Medicare Other | Admitting: Orthopaedic Surgery

## 2022-12-16 DIAGNOSIS — M25552 Pain in left hip: Secondary | ICD-10-CM | POA: Diagnosis not present

## 2022-12-16 DIAGNOSIS — Z96642 Presence of left artificial hip joint: Secondary | ICD-10-CM | POA: Diagnosis not present

## 2022-12-16 NOTE — Progress Notes (Signed)
The patient is someone well-known to me.  We replaced his left hip and January 2020.  He is 72 years old and he continues to have left hip flexor weakness and some pain along the iliopsoas tendon.  A MRI that we obtained of his hip sometime ago after surgery showed no muscle atrophy and no worrisome features around the hip.  I had sent him at 1 point for an ultrasound guided injection along the iliopsoas tendon he said that did not really help.  He has been through extensive physical therapy and that helped him quite a bit but he is still plateaued.  My exam his left hip moves smoothly and fluidly.  There is definitely weakness with his hip flexor and proximal thigh muscles on the left side.  I would like to send him to my partner Dr. Rolena Infante for an ultrasound-guided assessment of his left hip without any type of steroid injection but I would like Dr. Rolena Infante to see if he is a candidate for any type of other interventions for that left hip in terms of sonogram or anything else he can think of.  I would also like to send him to Dr. Ernestina Patches for left lower extremity nerve conduction studies just to make sure that were not sensing any nerve issues or atrophy as it relates to the hip.  We can then see him back in my section once he has been seen by my partners to see how he is doing overall and to see what our next recommendations may be.  He agrees with this treatment plan.  He is walking without a limp and his gotten through his issues with A-fib in terms of surgery that [facility] did on his atrium.

## 2022-12-17 ENCOUNTER — Other Ambulatory Visit: Payer: Self-pay

## 2022-12-17 DIAGNOSIS — M25552 Pain in left hip: Secondary | ICD-10-CM

## 2022-12-17 DIAGNOSIS — Z96642 Presence of left artificial hip joint: Secondary | ICD-10-CM

## 2022-12-22 ENCOUNTER — Encounter: Payer: Self-pay | Admitting: Sports Medicine

## 2022-12-22 ENCOUNTER — Ambulatory Visit (INDEPENDENT_AMBULATORY_CARE_PROVIDER_SITE_OTHER): Payer: Medicare Other | Admitting: Sports Medicine

## 2022-12-22 ENCOUNTER — Other Ambulatory Visit: Payer: Self-pay

## 2022-12-22 DIAGNOSIS — R29898 Other symptoms and signs involving the musculoskeletal system: Secondary | ICD-10-CM | POA: Diagnosis not present

## 2022-12-22 DIAGNOSIS — Z96642 Presence of left artificial hip joint: Secondary | ICD-10-CM

## 2022-12-22 DIAGNOSIS — M25552 Pain in left hip: Secondary | ICD-10-CM

## 2022-12-22 DIAGNOSIS — S76819A Strain of other specified muscles, fascia and tendons at thigh level, unspecified thigh, initial encounter: Secondary | ICD-10-CM | POA: Diagnosis not present

## 2022-12-22 MED ORDER — NITROGLYCERIN 0.2 MG/HR TD PT24: MEDICATED_PATCH | TRANSDERMAL | 1 refills | Status: DC

## 2022-12-22 NOTE — Progress Notes (Signed)
Keith Hernandez - 72 y.o. male MRN [id number]  Date of birth: [birth year]  Office Visit Note: Visit Date: 12/22/2022 PCP: Alysia Penna, MD Referred by: Alysia Penna, MD  Subjective: Chief Complaint  Patient presents with   Left Hip - Follow-up   HPI: Keith Hernandez is a pleasant 72 y.o. male who presents today for evaluation of left hip pain.  He is status post left hip THA with my partner Dr. Magnus Ivan around January 2020.  Brook continues to have some pain as well as weakness around the left hip flexor musculature.  Did have an MRI which did not show any worrisome features about the hip arthroplasty.  He reports in the past he had an ultrasound-guided iliopsoas injection which did not help at all.  He has been through rather extensive physical therapy and consistently helped him but feels like over the last few weeks or so this has plateaued.  He is PT did say that he felt like he was actually regressing in the last few sessions.  Has pain with hip flexion, getting in and out of the car. Not taking any consistent medication for this.  Review of cardiology note from 07/02/2022, patient follows with Dr. Bufford Buttner.  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  He is managed on metoprolol as well as flecainide.  Pertinent ROS were reviewed with the patient and found to be negative unless otherwise specified above in HPI.   Assessment & Plan: Visit Diagnoses:  1. Strain of rectus femoris muscle   2. History of left hip replacement   3. Pain in left hip   4. Weakness of left hip    Plan: I discussed with [place] today likely etiology of his hip pain and weakness.  Neither Dr. Magnus Ivan nor I see any abnormalities about the hip joint on imaging.  He does have some weakness with resisted hip extension, limited ultrasound does show a prior injury to the insertion of the rectus femoris with overlying scar tissue of the hip flexor musculature.  I do think this is  inhibiting his ability to flex the hip.  He also has notable weakness with hip abduction musculature, likely be gluteal muscles.  Treatment options with him including medication therapy, nitroglycerin patch protocol, extracorporeal shockwave therapy, PT/home rehab.  Did discuss referral for nitroglycerin patch.  He will place one fourth patch of the 0.2 mg/h patch to the painful area, daily.  Addendum: Did review indications and contraindications, extracorporeal shockwave therapy is safe to proceed with this total hip.  We will bring him back at his convenience to perform a trial of this to help break up the scar tissue injury of his rectus femoris pathology. Will provide HEP at that visit to address hip flexors and abductors as well.   Follow-up: Return for Will schedule for trial of shockwave (reg visit).   Meds & Orders:  Meds ordered this encounter  Medications   nitroGLYCERIN (NITRODUR - DOSED IN MG/24 HR) 0.2 mg/hr patch    Sig: Cut patch into fourths. Place 1/4 patch over affected area, change every 24 hours.    Dispense:  30 patch    Refill:  1    Orders Placed This Encounter  Procedures   [place] Extrem Low Left Ltd     Procedures: No procedures performed      Clinical History: No specialty comments available.  He reports that he quit smoking about 23 years ago. His smoking use included cigarettes. He has  a 30.00 pack-year smoking history. He has never used smokeless tobacco. No results for input(s): "HGBA1C", "LABURIC" in the last 8760 hours.  Objective:   Vital Signs: There were no vitals taken for this visit.  Physical Exam  Gen: Well-appearing, in no acute distress; non-toxic CV: Well-perfused. Warm.  Resp: Breathing unlabored on room air; no wheezing. Psych: Fluid speech in conversation; appropriate affect; normal thought process Neuro: Sensation intact throughout. No gross coordination deficits.   Ortho Exam - Left hip: Examination of the left hip shows a well-healed  prior THA incision without evidence of infection.  There is no swelling or redness.  There is some pain with resisted hip extension and Stinchfield test.  There is notable weakness with resisted hip abduction on the left, 5/5 strength on the contralateral hip.  Hip moves better fluidly with internal and external rotation.  Imaging:  [place] Extrem Low Left Ltd Limited musculoskeletal ultrasound of the left lower extremity, left hip  was performed today.  Evaluation of the ASIS demonstrates no cortical  irregularity.  Proper insertion of the sartorius muscle shows proper  insertion without evidence of high-grade tearing.  Evaluation of the  myotendinous junction of the hip flexor musculature demonstrates scar  tissue present in this region.  Underlying AIIS shows the cortical defect  with isoechoic change near the insertion of the rectus femoris tendon  origin.  This does suggest likely partial tearing with calcification.   Limited view of the hip joint shows a total hip without evidence of  effusion.       *Independent review and interpretation of the left hip x-ray from 09/23/2021 was performed by myself.  AP and lateral film shows a well-seated total hip arthroplasty without evidence of loosening or hardware malfunction.  There is heterotopic ossification laterally about the hip.  No other bony abnormality.  - Left hip xray 09/23/21: An AP pelvis and lateral left hip shows a well-seated total hip  arthroplasty with no complicating features.  The components are bone  ingrown and showed no evidence of loosening.   Narrative & Impression  CLINICAL DATA:  Chronic hip pain. Labral tear suspected. Left hip pain during flexing for 1-3 years. Prior hip replacement in 2020. Assess for fluid collection, hip flexor tendons, implant, trochanteric area.   EXAM: MR OF THE LEFT HIP WITHOUT CONTRAST   TECHNIQUE: Multiplanar, multisequence MR imaging was performed. No intravenous contrast was  administered.   COMPARISON:  Left hip radiographs 09/23/2021   FINDINGS: Bones: There is extensive metallic susceptibility artifact related to total left hip arthroplasty hardware obscuring much of the proximal left femur and left hemipelvis. Within this limitation, no large left hip joint effusion is seen beyond the artifact. No definite osteolysis or pseudotumor.   No acute fracture or avascular necrosis.   Articular cartilage and labrum   Left hip:   Articular cartilage:  Status post total left hip arthroplasty.   Labrum:  Status post total left hip arthroplasty.   Right hip:   On large field-of-view images, there is moderate anterior superior right acetabular subchondral degenerative cystic change. Mild high-grade anterior superior right acetabular and femoral head cartilage thinning. Mild anterior superior right femoral head-neck junction and anteromedial femoral head subchondral degenerative cystic change. Mild right femoral head peripheral circumferential degenerative osteophytes.   Joint or bursal effusion   Joint effusion:  No right hip joint effusion.   Bursae: No trochanteric bursitis on either hip.   Muscles and tendons   Muscles and tendons: The origins  of the bilateral rectus femoris and the bilateral common hamstring tendon origins are intact. The right gluteus minimus, gluteus medius, and iliopsoas tendon insertions are intact. The corresponding left-sided tendons are grossly intact, however obscured by artifact.   Other findings   Miscellaneous: The prostate is mildly to moderately enlarged, measuring up to 5.7 cm in transverse dimension.   IMPRESSION:: IMPRESSION: 1. Status post total left hip arthroplasty. Within the limitation of metallic susceptibility artifact, no large left hip joint effusion, osteolysis or pseudotumor is identified. 2. Moderate right femoroacetabular osteoarthritis, greatest at the anterior superior aspect.      Electronically Signed   By: Neita Garnetonald  Viola M.D.   On: 12/23/2021 12:39    Past Medical/Family/Surgical/Social History: Medications & Allergies reviewed per EMR, new medications updated. Patient Active Problem List   Diagnosis Date Noted   Irregular heart beat 04/03/2022   Atrial tachycardia 04/03/2022   Atherosclerosis of coronary artery 04/03/2022   Foreign body in respiratory tract 02/05/2020   Status post total replacement of left hip 09/30/2018   Unilateral primary osteoarthritis, left hip 07/27/2018   Lipoma 03/23/2017   Dysfunction of right eustachian tube 11/26/2015   Seasonal allergic rhinitis 11/26/2015   Changing skin lesion 09/20/2015   Migraine, unspecified, not intractable, without status migrainosus 04/29/2015   Hyperlipidemia 04/29/2015   Gastro-esophageal reflux disease without esophagitis 04/29/2015   Essential hypertension 04/29/2015   Contracture of palmar fascia 04/29/2015   Abdominal aortic aneurysm without rupture 04/29/2015   Past Medical History:  Diagnosis Date   AAA (abdominal aortic aneurysm) (HCC)    Arthritis    Bulging of cervical intervertebral disc    C6-7   Dupuytren's disease    GERD (gastroesophageal reflux disease)    OTC meds pnr   Hyperlipidemia    Hypertension    Migraines    Family History  Problem Relation Age of Onset   Arthritis Mother    Heart disease Mother    Heart attack Mother    Asthma Father    Asthma Paternal Grandfather    Past Surgical History:  Procedure Laterality Date   achilles reattachment     FASCIOTOMY Left 10/11/2015   Procedure: LEFT HAND SUBTOTAL PALMAR FASCIOTOMY WITH REPAIR RECONSTRUCTION ;  Surgeon: Dominica SeverinWilliam Gramig, MD;  Location: [facility];  Service: Orthopedics;  Laterality: Left;   Left Atrial Mass Excision  Left 05/07/2022   [facility] Dr. Zebedee IbaGaca   LESION DESTRUCTION Bilateral 07/29/2017   Procedure: EXCISION LIP LESION;  Surgeon: Peggye Formillingham, Claire S, DO;  Location: [facility];  Service: Plastics;  Laterality: Bilateral;   LIPOMA EXCISION Right 07/29/2017   Procedure: EXCISION BACK LIPOMA;  Surgeon: Peggye Formillingham, Claire S, DO;  Location: [facility];  Service: Plastics;  Laterality: Right;   MAZE Left 05/07/2022   [facility] Dr.Gaca   TOTAL HIP ARTHROPLASTY Left 09/30/2018   Procedure: LEFT TOTAL HIP ARTHROPLASTY ANTERIOR APPROACH;  Surgeon: Kathryne HitchBlackman, Christopher Y, MD;  Location: [facility];  Service: Orthopedics;  Laterality: Left;   Social History   Occupational History   Occupation: retired  Tobacco Use   Smoking status: Former    Packs/day: 1.00    Years: 30.00    Additional pack years: 0.00    Total pack years: 30.00    Types: Cigarettes    Quit date: 07/15/1999    Years since quitting: 23.4   Smokeless tobacco: Never  Vaping Use   Vaping Use: Never used  Substance and Sexual Activity   Alcohol use:  Yes    Alcohol/week: 7.0 standard drinks of alcohol    Types: 5 Glasses of wine, 2 Shots of liquor per week    Comment: social   Drug use: No   Sexual activity: Not on file

## 2022-12-22 NOTE — Patient Instructions (Signed)

## 2022-12-24 ENCOUNTER — Encounter: Payer: Self-pay | Admitting: Internal Medicine

## 2022-12-24 ENCOUNTER — Telehealth: Payer: Self-pay

## 2022-12-24 ENCOUNTER — Ambulatory Visit: Payer: Medicare Other | Admitting: Internal Medicine

## 2022-12-24 VITALS — BP 140/80 | HR 60 | Ht 71.5 in | Wt 204.0 lb

## 2022-12-24 DIAGNOSIS — Z7901 Long term (current) use of anticoagulants: Secondary | ICD-10-CM

## 2022-12-24 DIAGNOSIS — Z1211 Encounter for screening for malignant neoplasm of colon: Secondary | ICD-10-CM

## 2022-12-24 DIAGNOSIS — I4719 Other supraventricular tachycardia: Secondary | ICD-10-CM

## 2022-12-24 NOTE — Progress Notes (Signed)
Keith Hernandez 72 y.o. August 06, 1951 [id number]  Assessment & Plan:   Encounter Diagnoses  Name Primary?   Colon cancer screening Yes   Long term current use of anticoagulant - Eliquis    Atrial tachycardia    Will schedule colonoscopy, he has 2 trips coming up 1 is within the states and then he is going to [place] in late May returning in early June.  We have decided to postpone any colonoscopy testing until after he returns and it will be scheduled and he will have a previsit for instructions.  We will clarify with cardiology regarding holding Eliquis 2 days prior to colonoscopy.  I do not think that will be a problem I did explain that there is a very rare real risk of stroke while off his anticoagulation but we will try to minimize the time.  CC: Alysia Penna, MD   Subjective:   Chief Complaint: Colon cancer screening, patient on chronic Eliquis  HPI 72 year old married white man presents to schedule screening colonoscopy, in the setting of chronic Eliquis use after atrial myxoma resection and atrial tachycardia issues.  He is actually doing well from a cardiac standpoint but he remains on Eliquis at this point.  Status post minimally invasive left atrial mass excision 05/07/2022 at [facility].  Also had a maze procedure due to his atrial tachycardia.  Not having any gastrointestinal complaints.  I did his wife's colonoscopy earlier this year he is a previous patient of Dr. Newell Coral and Danise Edge.  10-year recall recommended after hyperplastic polyp removal. Allergies  Allergen Reactions   Pollen Extract     Other reaction(s): Other (See Comments) Nasal drainage, itchy eyes   Atorvastatin     Other reaction(s): myalgia   Current Meds  Medication Sig   apixaban (ELIQUIS) 5 MG TABS tablet Take 1 tablet (5 mg total) by mouth 2 (two) times daily.   Cholecalciferol (VITAMIN D3 PO) Take 1 capsule by mouth daily.   flecainide (TAMBOCOR) 50 MG tablet TAKE 1 TABLET BY MOUTH TWICE A  DAY   LORazepam (ATIVAN) 0.5 MG tablet Take 0.5 mg by mouth 2 (two) times daily as needed.   metoprolol succinate (TOPROL-XL) 50 MG 24 hr tablet TAKE 1 TABLET (50 MG TOTAL) BY MOUTH IN THE MORNING AND AT BEDTIME. TAKE WITH OR IMMEDIATELY FOLLOWING A MEAL.   Multiple Vitamin (MULTIVITAMIN) tablet Take 1 tablet by mouth daily.   nitroGLYCERIN (NITRODUR - DOSED IN MG/24 HR) 0.2 mg/hr patch Cut patch into fourths. Place 1/4 patch over affected area, change every 24 hours.   rosuvastatin (CRESTOR) 5 MG tablet TAKE 1 TABLET (5 MG TOTAL) BY MOUTH DAILY.   triamcinolone (NASACORT) 55 MCG/ACT AERO nasal inhaler Place 1 spray into the nose daily as needed (for congestion).   Past Medical History:  Diagnosis Date   AAA (abdominal aortic aneurysm)    Anxiety    Arthritis    Bulging of cervical intervertebral disc    C6-7   Dupuytren's disease    GERD (gastroesophageal reflux disease)    OTC meds pnr   History of colon polyps    Hyperlipidemia    Hypertension    Migraines    Pneumonia    Sleep apnea    Past Surgical History:  Procedure Laterality Date   achilles reattachment     FASCIOTOMY Left 10/11/2015   Procedure: LEFT HAND SUBTOTAL PALMAR FASCIOTOMY WITH REPAIR RECONSTRUCTION ;  Surgeon: Dominica Severin, MD;  Location: [facility];  Service:  Orthopedics;  Laterality: Left;   Left Atrial Mass Excision  Left 05/07/2022   [facility] Dr. Zebedee Iba, with cardiac ablation   LESION DESTRUCTION Bilateral 07/29/2017   Procedure: EXCISION LIP LESION;  Surgeon: Peggye Form, DO;  Location: [facility];  Service: Plastics;  Laterality: Bilateral;   LIPOMA EXCISION Right 07/29/2017   Procedure: EXCISION BACK LIPOMA;  Surgeon: Peggye Form, DO;  Location: [facility];  Service: Plastics;  Laterality: Right;   MAZE Left 05/07/2022   [facility] Dr.Gaca   TOTAL HIP ARTHROPLASTY Left 09/30/2018   Procedure: LEFT TOTAL HIP ARTHROPLASTY ANTERIOR APPROACH;  Surgeon:  Kathryne Hitch, MD;  Location: [facility];  Service: Orthopedics;  Laterality: Left;   Social History   Social History Narrative   Married and retired no children.  Former smoker one half an alcoholic beverage daily 1 caffeinated beverage daily no drug use no tobacco currently.   family history includes Arthritis in his mother; Asthma in his father and paternal grandfather; Heart attack in his mother; Heart disease in his mother.   Review of Systems See HPI.  Some arthritis pain in the toe and hip, occasional back pain seasonal allergies.  Insomnia issues.  He has some dyspnea since his heart surgery though it is improving.  Otherwise negative.  Objective:   Physical Exam @BP  (!) 140/80   Pulse 60   Ht 5' 11.5" (1.816 m)   Wt 204 lb (92.5 kg)   BMI 28.06 kg/m @  General:  NAD Eyes:   anicteric Lungs:  Clear Chest:  Post-op scars Heart::  S1S2 no rubs, murmurs or gallops Abdomen:  soft and nontender, BS+ Ext:   no edema, cyanosis or clubbing    Data Reviewed:  See HPI

## 2022-12-24 NOTE — Patient Instructions (Signed)
We have scheduled your colonoscopy with Dr. Leone Payor for 03/04/23 at 8:30 am and your telephone nurse visit is on 02/25/23 at 10:30am.   You will be contaced by our office prior to your procedure for directions on holding your Eliquis.  If you do not hear from our office 1 week prior to your scheduled procedure, please call [phone] to discuss.  The [facility] providers would like to encourage you to use [facility] to communicate with providers for non-urgent requests or questions.  Due to long hold times on the telephone, sending your provider a message by [facility] may be a faster and more efficient way to get a response.  Please allow 48 business hours for a response.  Please remember that this is for non-urgent requests.   Due to recent changes in healthcare laws, you may see the results of your imaging and laboratory studies on MyChart before your provider has had a chance to review them.  We understand that in some cases there may be results that are confusing or concerning to you. Not all laboratory results come back in the same time frame and the provider may be waiting for multiple results in order to interpret others.  Please give [place] 48 hours in order for your provider to thoroughly review all the results before contacting the office for clarification of your results.   Thank you for choosing me and [facility].  Stan Head, MD

## 2022-12-24 NOTE — Telephone Encounter (Signed)
[facility] Pre-operative Risk Assessment     Request for surgical clearance:     Endoscopy Procedure  What type of surgery is being performed?     colonoscopy  When is this surgery scheduled?     03/14/2023  What type of clearance is required ?   Pharmacy  Are there any medications that need to be held prior to surgery and how long? Eliquis, 2 days  Practice name and name of physician performing surgery?      [facility]  What is your office phone and fax number?      Phone- [phone]  Fax- [phone]  Anesthesia type (None, local, MAC, general) ?       MAC

## 2022-12-25 ENCOUNTER — Other Ambulatory Visit: Payer: Self-pay

## 2022-12-25 ENCOUNTER — Encounter: Payer: Self-pay | Admitting: Sports Medicine

## 2022-12-25 ENCOUNTER — Ambulatory Visit: Payer: Medicare Other | Admitting: Sports Medicine

## 2022-12-25 DIAGNOSIS — S76812A Strain of other specified muscles, fascia and tendons at thigh level, left thigh, initial encounter: Secondary | ICD-10-CM

## 2022-12-25 DIAGNOSIS — I499 Cardiac arrhythmia, unspecified: Secondary | ICD-10-CM

## 2022-12-25 DIAGNOSIS — Z96642 Presence of left artificial hip joint: Secondary | ICD-10-CM | POA: Diagnosis not present

## 2022-12-25 DIAGNOSIS — R29898 Other symptoms and signs involving the musculoskeletal system: Secondary | ICD-10-CM

## 2022-12-25 DIAGNOSIS — S76819A Strain of other specified muscles, fascia and tendons at thigh level, unspecified thigh, initial encounter: Secondary | ICD-10-CM

## 2022-12-25 MED ORDER — METOPROLOL SUCCINATE ER 50 MG PO TB24: 50.0000 mg | ORAL_TABLET | Freq: Two times a day (BID) | ORAL | 1 refills | Status: DC

## 2022-12-25 NOTE — Telephone Encounter (Signed)
Patient with diagnosis of afib on Eliquis for anticoagulation.    Procedure: colonoscopy Date of procedure: 03/14/23  CHA2DS2-VASc Score = 3  This indicates a 3.2% annual risk of stroke. The patient's score is based upon: CHF History: 0 HTN History: 1 Diabetes History: 0 Stroke History: 0 Vascular Disease History: 1 Age Score: 1 Gender Score: 0   CrCl 26mL/min Platelet count 191K  Per office protocol, patient can hold Eliquis for 2 days prior to procedure as requested.    **This guidance is not considered finalized until pre-operative APP has relayed final recommendations.**

## 2022-12-25 NOTE — Progress Notes (Signed)
Keith Hernandez - 72 y.o. male MRN [id number]  Date of birth: November 03, 1950  Office Visit Note: Visit Date: 12/25/2022 PCP: Alysia Penna, MD Referred by: Alysia Penna, MD  Subjective: Chief Complaint  Patient presents with   Left Hip - Follow-up   HPI: Keith Hernandez is a pleasant 72 y.o. male who presents today for left anterior hip pain.  He is status post left hip THA with my partner Dr. Magnus Ivan around January 2020. Chaddrick continues to have some pain as well as weakness around the left hip flexor musculature.   Last visit we did [place] his hip and saw some insertional partial tearing/tendinopathy over the AIIS and rectus femoris region. He has started the nitroglycerin patch protocol, tolerating well.  He was doing formalized physical therapy, but stopped over a week ago for this evaluation.   Pertinent ROS were reviewed with the patient and found to be negative unless otherwise specified above in HPI.   Assessment & Plan: Visit Diagnoses:  1. Strain of rectus femoris muscle   2. Weakness of left hip   3. History of left hip replacement    Plan: Discussed treatment options for his anterior hip pain and rectus femoris strain.  Through shared decision-making, elected to proceed with trial of extracorporeal shockwave therapy.  He will present next week for second treatment and after 2 sessions we will reevaluate to see what sort of improvement he has received.  He will continue his nitroglycerin 0.2 mg/hr patch, changing q 24 hrs. I did print out a customized hip stabilization and strengthening handout for him today, we did review this in the room together and he did perform ensure he understands.  I would like him to start this on Sunday and he will perform alternating days once daily. F/u in 1 week.  Follow-up: Return in about 1 week (around 01/01/2023) for left anterior hip pain.   Meds & Orders: No orders of the defined types were placed in this encounter.  No orders of the defined  types were placed in this encounter.    Procedures: Procedure: ECSWT Indications:  Rectus femoris tendinopathy   Procedure Details Consent: Risks of procedure as well as the alternatives and risks of each were explained to the patient.  Verbal consent for procedure obtained. Time Out: Verified patient identification, verified procedure, site was marked, verified correct patient position. The area was cleaned with alcohol swab.     The Left rectus femoris tendon and anterolateral hip was targeted for Extracorporeal shockwave therapy.    Preset: Status post muscular injury Power Level: 120 mJ Frequency: 14 Hz Impulse/cycles: 3500 Head size: Regular   Patient tolerated procedure well without immediate complications.        Clinical History: No specialty comments available.  He reports that he quit smoking about 23 years ago. His smoking use included cigarettes. He has a 30.00 pack-year smoking history. He has never used smokeless tobacco. No results for input(s): "HGBA1C", "LABURIC" in the last 8760 hours.  Objective:   Vital Signs: There were no vitals taken for this visit.  Physical Exam  Gen: Well-appearing, in no acute distress; non-toxic CV:  Well-perfused. Warm.  Resp: Breathing unlabored on room air; no wheezing. Psych: Fluid speech in conversation; appropriate affect; normal thought process Neuro: Sensation intact throughout. No gross coordination deficits.   Ortho Exam - Left hip: Examination shows left hip with a well-healed THA incision without evidence of infection.  There is a nitroglycerin patch just lateral to the  well-healed scar.  There is again weakness with hip flexion and hip abduction.  Imaging: [place] Extrem Low Left Ltd Limited musculoskeletal ultrasound of the left lower extremity, left hip  was performed today.  Evaluation of the ASIS demonstrates no cortical  irregularity.  Proper insertion of the sartorius muscle shows proper  insertion without  evidence of high-grade tearing.  Evaluation of the  myotendinous junction of the hip flexor musculature demonstrates scar  tissue present in this region.  Underlying AIIS shows the cortical defect  with isoechoic change near the insertion of the rectus femoris tendon  origin.  This does suggest likely partial tearing with calcification.   Limited view of the hip joint shows a total hip without evidence of  effusion.    Past Medical/Family/Surgical/Social History: Medications & Allergies reviewed per EMR, new medications updated. Patient Active Problem List   Diagnosis Date Noted   Irregular heart beat 04/03/2022   Atrial tachycardia 04/03/2022   Atherosclerosis of coronary artery 04/03/2022   Foreign body in respiratory tract 02/05/2020   Status post total replacement of left hip 09/30/2018   Unilateral primary osteoarthritis, left hip 07/27/2018   Lipoma 03/23/2017   Dysfunction of right eustachian tube 11/26/2015   Seasonal allergic rhinitis 11/26/2015   Changing skin lesion 09/20/2015   Migraine, unspecified, not intractable, without status migrainosus 04/29/2015   Hyperlipidemia 04/29/2015   Gastro-esophageal reflux disease without esophagitis 04/29/2015   Essential hypertension 04/29/2015   Contracture of palmar fascia 04/29/2015   Abdominal aortic aneurysm without rupture 04/29/2015   Past Medical History:  Diagnosis Date   AAA (abdominal aortic aneurysm)    Anxiety    Arthritis    Bulging of cervical intervertebral disc    C6-7   Dupuytren's disease    GERD (gastroesophageal reflux disease)    OTC meds pnr   History of colon polyps    Hyperlipidemia    Hypertension    Migraines    Pneumonia    Sleep apnea    Family History  Problem Relation Age of Onset   Arthritis Mother    Heart disease Mother    Heart attack Mother    Asthma Father    Asthma Paternal Grandfather    Past Surgical History:  Procedure Laterality Date   achilles reattachment      FASCIOTOMY Left 10/11/2015   Procedure: LEFT HAND SUBTOTAL PALMAR FASCIOTOMY WITH REPAIR RECONSTRUCTION ;  Surgeon: Dominica Severin, MD;  Location: [facility];  Service: Orthopedics;  Laterality: Left;   Left Atrial Mass Excision  Left 05/07/2022   [facility] Dr. Zebedee Iba, with cardiac ablation   LESION DESTRUCTION Bilateral 07/29/2017   Procedure: EXCISION LIP LESION;  Surgeon: Peggye Form, DO;  Location: [facility];  Service: Plastics;  Laterality: Bilateral;   LIPOMA EXCISION Right 07/29/2017   Procedure: EXCISION BACK LIPOMA;  Surgeon: Peggye Form, DO;  Location: [facility];  Service: Plastics;  Laterality: Right;   MAZE Left 05/07/2022   [facility] Dr.Gaca   TOTAL HIP ARTHROPLASTY Left 09/30/2018   Procedure: LEFT TOTAL HIP ARTHROPLASTY ANTERIOR APPROACH;  Surgeon: Kathryne Hitch, MD;  Location: [facility];  Service: Orthopedics;  Laterality: Left;   Social History   Occupational History   Occupation: retired  Tobacco Use   Smoking status: Former    Packs/day: 1.00    Years: 30.00    Additional pack years: 0.00    Total pack years: 30.00    Types: Cigarettes    Quit  date: 07/15/1999    Years since quitting: 23.4   Smokeless tobacco: Never  Vaping Use   Vaping Use: Never used  Substance and Sexual Activity   Alcohol use: Yes    Alcohol/week: 7.0 standard drinks of alcohol    Types: 5 Glasses of wine, 2 Shots of liquor per week    Comment: social, .5 per day   Drug use: No   Sexual activity: Not on file   Patient was instructed in 10 minutes of therapeutic exercises for left anterior and lateral hip to improve strength, ROM and function according to my instructions and plan of care by myself during the office visit. A customized handout was provided and demonstration of proper technique shown and discussed. Patient did perform exercises and demonstrate understanding through teachback.  All questions discussed and  answered.  Madelyn Brunner, DO Primary Care Sports Medicine Physician  [facility]  This note was dictated using Dragon naturally speaking software and may contain errors in syntax, spelling, or content which have not been identified prior to signing this note.

## 2022-12-25 NOTE — Telephone Encounter (Signed)
Patient has already been scheduled with Dr. Flora Lipps on 5/1 and pre-op clearance can be addressed then. I will update appointment notes to reflect pre-op. I will route to requesting surgeon's office.

## 2022-12-25 NOTE — Telephone Encounter (Signed)
   Name: Keith Hernandez  DOB: 1950-10-20  MRN: [id number]  Primary Cardiologist: Reatha Harps, MD   Preoperative team, please contact this patient and set up a phone call appointment for further preoperative risk assessment. Please obtain consent and complete medication review. Thank you for your help.  I confirm that guidance regarding antiplatelet and oral anticoagulation therapy has been completed and, if necessary, noted below. Per Pharm D: Patient with diagnosis of afib on Eliquis for anticoagulation.     Procedure: colonoscopy Date of procedure: 03/14/23    Per office protocol, patient can hold Eliquis for 2 days prior to procedure as requested.      Carlos Levering, NP 12/25/2022, 10:50 AM  [facility]

## 2023-01-01 ENCOUNTER — Telehealth: Payer: Self-pay

## 2023-01-01 ENCOUNTER — Ambulatory Visit: Payer: Medicare Other | Admitting: Sports Medicine

## 2023-01-01 ENCOUNTER — Encounter: Payer: Self-pay | Admitting: Sports Medicine

## 2023-01-01 DIAGNOSIS — Z96642 Presence of left artificial hip joint: Secondary | ICD-10-CM

## 2023-01-01 DIAGNOSIS — R29898 Other symptoms and signs involving the musculoskeletal system: Secondary | ICD-10-CM | POA: Diagnosis not present

## 2023-01-01 DIAGNOSIS — S76819A Strain of other specified muscles, fascia and tendons at thigh level, unspecified thigh, initial encounter: Secondary | ICD-10-CM | POA: Diagnosis not present

## 2023-01-01 NOTE — Telephone Encounter (Signed)
Dr. Shon Baton says patient is ready for lower extremity nerve study

## 2023-01-01 NOTE — Progress Notes (Signed)
MALIKE FOGLIO - 72 y.o. male MRN [id number]  Date of birth: May 17, 1951  Office Visit Note: Visit Date: 01/01/2023 PCP: Alysia Penna, MD Referred by: Alysia Penna, MD  Subjective: Chief Complaint  Patient presents with   Left Hip - Pain, Follow-up   HPI: Keith Hernandez is a pleasant 72 y.o. male who presents today for follow-up of left hip pain.  We did perform a extrapleural shockwave therapy last week, he did note some improvement for the first few days.  His pain has waxed and waned but feels like he does note some continued benefit.  Continues with nitroglycerin patch changing every 24 hours.  He has been given some of the home exercises, but I would like to clarification on a few of them today.  Inquiring about an EMG/nerve conduction study that was previously ordered by Dr. Magnus Ivan.  Pertinent ROS were reviewed with the patient and found to be negative unless otherwise specified above in HPI.   Assessment & Plan: Visit Diagnoses:  1. Strain of rectus femoris muscle   2. Weakness of left hip   3. History of left hip replacement    Plan: Discussed with Onalee Hua treatment options and further workup for left hip pain.  Given his ultrasound, we did show evidence of a prior partial tear of the rectus femoris which is where I think his pain and some of his weakness is originating from.  He did get some benefit from the initial extracorporeal shockwave therapy, we did repeat this again today.  I would like him to continue his nitroglycerin 0.2 mg/h patch, changing every 24 hours.  We did review case home hip stabilization and strengthening exercises today to modify them to make sure he understood.  He will continue these once daily.  I would like to see how his pain and weakness improves with ESWT and his home exercises.  Previously, Dr. Magnus Ivan did order EMG/nerve conduction studies to rule out any neuromuscular dysfunction, I do think this is pertinent for complete workup so we will have  him schedule wrist in addition.  He will follow-up with me next week.  Follow-up: Return in about 1 week (around 01/08/2023) for for L-hip; also may schedule EMG/NCS with [facility].   Meds & Orders: No orders of the defined types were placed in this encounter.  No orders of the defined types were placed in this encounter.    Procedures:  Procedure: ECSWT Indications:  Rectus femoris tendinopathy   Procedure Details Consent: Risks of procedure as well as the alternatives and risks of each were explained to the patient.  Verbal consent for procedure obtained. Time Out: Verified patient identification, verified procedure, site was marked, verified correct patient position. The area was cleaned with alcohol swab.     The Left rectus femoris tendon and anterolateral hip was targeted for Extracorporeal shockwave therapy.    Preset: Status post muscular injury Power Level: 120 mJ Frequency: 14 Hz Impulse/cycles: 3500 Head size: Regular   Patient tolerated procedure well without immediate complications.     Clinical History: No specialty comments available.  He reports that he quit smoking about 23 years ago. His smoking use included cigarettes. He has a 30.00 pack-year smoking history. He has never used smokeless tobacco. No results for input(s): "HGBA1C", "LABURIC" in the last 8760 hours.  Objective:   Vital Signs: There were no vitals taken for this visit.  Physical Exam  Gen: Well-appearing, in no acute distress; non-toxic CV: Well-perfused. Warm.  Resp: Breathing  unlabored on room air; no wheezing. Psych: Fluid speech in conversation; appropriate affect; normal thought process Neuro: Sensation intact throughout. No gross coordination deficits.   Ortho Exam - Left hip: Examination shows left hip with a well-healed THA incision without evidence of infection. There is a nitroglycerin patch just lateral to the well-healed scar.  There is noted weakness and mild pain with hip flexion  and hip abduction.   Imaging: No results found.  Past Medical/Family/Surgical/Social History: Medications & Allergies reviewed per EMR, new medications updated. Patient Active Problem List   Diagnosis Date Noted   Irregular heart beat 04/03/2022   Atrial tachycardia 04/03/2022   Atherosclerosis of coronary artery 04/03/2022   Foreign body in respiratory tract 02/05/2020   Status post total replacement of left hip 09/30/2018   Unilateral primary osteoarthritis, left hip 07/27/2018   Lipoma 03/23/2017   Dysfunction of right eustachian tube 11/26/2015   Seasonal allergic rhinitis 11/26/2015   Changing skin lesion 09/20/2015   Migraine, unspecified, not intractable, without status migrainosus 04/29/2015   Hyperlipidemia 04/29/2015   Gastro-esophageal reflux disease without esophagitis 04/29/2015   Essential hypertension 04/29/2015   Contracture of palmar fascia 04/29/2015   Abdominal aortic aneurysm without rupture 04/29/2015   Past Medical History:  Diagnosis Date   AAA (abdominal aortic aneurysm)    Anxiety    Arthritis    Bulging of cervical intervertebral disc    C6-7   Dupuytren's disease    GERD (gastroesophageal reflux disease)    OTC meds pnr   History of colon polyps    Hyperlipidemia    Hypertension    Migraines    Pneumonia    Sleep apnea    Family History  Problem Relation Age of Onset   Arthritis Mother    Heart disease Mother    Heart attack Mother    Asthma Father    Asthma Paternal Grandfather    Past Surgical History:  Procedure Laterality Date   achilles reattachment     FASCIOTOMY Left 10/11/2015   Procedure: LEFT HAND SUBTOTAL PALMAR FASCIOTOMY WITH REPAIR RECONSTRUCTION ;  Surgeon: Dominica Severin, MD;  Location: [facility];  Service: Orthopedics;  Laterality: Left;   Left Atrial Mass Excision  Left 05/07/2022   [facility] Dr. Zebedee Iba, with cardiac ablation   LESION DESTRUCTION Bilateral 07/29/2017   Procedure: EXCISION LIP LESION;   Surgeon: Peggye Form, DO;  Location: [facility];  Service: Plastics;  Laterality: Bilateral;   LIPOMA EXCISION Right 07/29/2017   Procedure: EXCISION BACK LIPOMA;  Surgeon: Peggye Form, DO;  Location: [facility];  Service: Plastics;  Laterality: Right;   MAZE Left 05/07/2022   [facility] Dr.Gaca   TOTAL HIP ARTHROPLASTY Left 09/30/2018   Procedure: LEFT TOTAL HIP ARTHROPLASTY ANTERIOR APPROACH;  Surgeon: Kathryne Hitch, MD;  Location: [facility];  Service: Orthopedics;  Laterality: Left;   Social History   Occupational History   Occupation: retired  Tobacco Use   Smoking status: Former    Packs/day: 1.00    Years: 30.00    Additional pack years: 0.00    Total pack years: 30.00    Types: Cigarettes    Quit date: 07/15/1999    Years since quitting: 23.4   Smokeless tobacco: Never  Vaping Use   Vaping Use: Never used  Substance and Sexual Activity   Alcohol use: Yes    Alcohol/week: 7.0 standard drinks of alcohol    Types: 5 Glasses of wine, 2 Shots of liquor  per week    Comment: social, .5 per day   Drug use: No   Sexual activity: Not on file   I spent 36 minutes in the care of the patient today including face-to-face time, preparation to see the patient, as well as review of customized home exercise plan, discussion on further treatment options, ordering and explanation of EMG/nerve conduction studies for workup for the above diagnoses.   Madelyn Brunner, DO Primary Care Sports Medicine Physician  [facility]  This note was dictated using Dragon naturally speaking software and may contain errors in syntax, spelling, or content which have not been identified prior to signing this note.

## 2023-01-01 NOTE — Progress Notes (Signed)
States shockwave did relieve his pain for a few days;  Brought in exercises today to get clarification on some of them, and stated he could not do 1 of them due to pain

## 2023-01-04 NOTE — Telephone Encounter (Signed)
Spoke with patient and scheduled NCV for 01/15/23.

## 2023-01-08 ENCOUNTER — Encounter: Payer: Self-pay | Admitting: Sports Medicine

## 2023-01-08 ENCOUNTER — Ambulatory Visit: Payer: Medicare Other | Admitting: Sports Medicine

## 2023-01-08 DIAGNOSIS — R29898 Other symptoms and signs involving the musculoskeletal system: Secondary | ICD-10-CM

## 2023-01-08 DIAGNOSIS — S76812A Strain of other specified muscles, fascia and tendons at thigh level, left thigh, initial encounter: Secondary | ICD-10-CM | POA: Diagnosis not present

## 2023-01-08 DIAGNOSIS — Z96642 Presence of left artificial hip joint: Secondary | ICD-10-CM | POA: Diagnosis not present

## 2023-01-08 DIAGNOSIS — S76819A Strain of other specified muscles, fascia and tendons at thigh level, unspecified thigh, initial encounter: Secondary | ICD-10-CM

## 2023-01-08 NOTE — Progress Notes (Signed)
Doing ok; has EMG scheduled with Alvester Morin for May 3

## 2023-01-08 NOTE — Progress Notes (Signed)
Keith Hernandez - 72 y.o. male MRN [id number]  Date of birth: 30-Nov-1950  Office Visit Note: Visit Date: 01/08/2023 PCP: Alysia Penna, MD Referred by: Alysia Penna, MD  Subjective: Chief Complaint  Patient presents with   Left Hip - Follow-up   HPI: Keith Hernandez is a pleasant 72 y.o. male who presents today for follow-up of left hip pain.  Previous US his hip and saw some insertional partial tearing/tendinopathy over the AIIS and rectus femoris region. He has continued on the nitroglycerin patch protocol, tolerating well. Have done 2 sessions of ECSWT, has noticed only minimal improvement.  After the first session he did note some improvement, but did not receive much benefit after the last 1.  He does have his EMG scheduled with Dr. Alvester Morin for 01/15/23.  Pertinent ROS were reviewed with the patient and found to be negative unless otherwise specified above in HPI.   Assessment & Plan: Visit Diagnoses:  1. Strain of rectus femoris muscle   2. Weakness of left hip   3. History of left hip replacement    Plan: Discussed with [facility] options for the left hip.  He did get some relief after the first shockwave therapy, although did not notice much of a difference after the last 1.  He is doing the home exercises, getting some pain with external rotation.  In general just feels like the hip feels weak.  At this point, we did proceed with 1 additional ESWT, he will let me know what sort of improvement he feels over the coming 1-2 weeks.  If he is 25% improved or greater, we may consider continuing this.  Given the weakness, I do think it is pertinent to get the EMG/nerve conduction study first.  He has this upcoming and we will follow-up the next week to review and discuss next steps.  He will continue nitroglycerin patch protocol until that time. Continue HEP but discontinue ER/IR of the hip exercise.  Follow-up: Return for after EMG/NCS.   Meds & Orders: No orders of the defined types were  placed in this encounter.  No orders of the defined types were placed in this encounter.    Procedures: Procedure: ECSWT Indications:  Rectus femoris tendinopathy   Procedure Details Consent: Risks of procedure as well as the alternatives and risks of each were explained to the patient.  Verbal consent for procedure obtained. Time Out: Verified patient identification, verified procedure, site was marked, verified correct patient position. The area was cleaned with alcohol swab.     The Left rectus femoris tendon and anterolateral hip was targeted for Extracorporeal shockwave therapy.    Preset: Status post muscular injury Power Level: 120 mJ Frequency: 15 Hz Impulse/cycles: 3500  Head size: Regular   Patient tolerated procedure well without immediate complications.      Clinical History: No specialty comments available.  He reports that he quit smoking about 23 years ago. His smoking use included cigarettes. He has a 30.00 pack-year smoking history. He has never used smokeless tobacco. No results for input(s): "HGBA1C", "LABURIC" in the last 8760 hours.  Objective:    Physical Exam  Gen: Well-appearing, in no acute distress; non-toxic CV: Well-perfused. Warm.  Resp: Breathing unlabored on room air; no wheezing. Psych: Fluid speech in conversation; appropriate affect; normal thought process Neuro: Sensation intact throughout. No gross coordination deficits.   Ortho Exam - Left hip: unchanged from prior exams.  Imaging: No results found.  Past Medical/Family/Surgical/Social History: Medications & Allergies reviewed  per EMR, new medications updated. Patient Active Problem List   Diagnosis Date Noted   Irregular heart beat 04/03/2022   Atrial tachycardia 04/03/2022   Atherosclerosis of coronary artery 04/03/2022   Foreign body in respiratory tract 02/05/2020   Status post total replacement of left hip 09/30/2018   Unilateral primary osteoarthritis, left hip 07/27/2018    Lipoma 03/23/2017   Dysfunction of right eustachian tube 11/26/2015   Seasonal allergic rhinitis 11/26/2015   Changing skin lesion 09/20/2015   Migraine, unspecified, not intractable, without status migrainosus 04/29/2015   Hyperlipidemia 04/29/2015   Gastro-esophageal reflux disease without esophagitis 04/29/2015   Essential hypertension 04/29/2015   Contracture of palmar fascia 04/29/2015   Abdominal aortic aneurysm without rupture (HCC) 04/29/2015   Past Medical History:  Diagnosis Date   AAA (abdominal aortic aneurysm) (HCC)    Anxiety    Arthritis    Bulging of cervical intervertebral disc    C6-7   Dupuytren's disease    GERD (gastroesophageal reflux disease)    OTC meds pnr   History of colon polyps    Hyperlipidemia    Hypertension    Migraines    Pneumonia    Sleep apnea    Family History  Problem Relation Age of Onset   Arthritis Mother    Heart disease Mother    Heart attack Mother    Asthma Father    Asthma Paternal Grandfather    Past Surgical History:  Procedure Laterality Date   achilles reattachment     FASCIOTOMY Left 10/11/2015   Procedure: LEFT HAND SUBTOTAL PALMAR FASCIOTOMY WITH REPAIR RECONSTRUCTION ;  Surgeon: Dominica Severin, MD;  Location: [facility];  Service: Orthopedics;  Laterality: Left;   Left Atrial Mass Excision  Left 05/07/2022   [facility] Dr. Zebedee Iba, with cardiac ablation   LESION DESTRUCTION Bilateral 07/29/2017   Procedure: EXCISION LIP LESION;  Surgeon: Peggye Form, DO;  Location: [facility];  Service: Plastics;  Laterality: Bilateral;   LIPOMA EXCISION Right 07/29/2017   Procedure: EXCISION BACK LIPOMA;  Surgeon: Peggye Form, DO;  Location: [facility];  Service: Plastics;  Laterality: Right;   MAZE Left 05/07/2022   [facility] Dr.Gaca   TOTAL HIP ARTHROPLASTY Left 09/30/2018   Procedure: LEFT TOTAL HIP ARTHROPLASTY ANTERIOR APPROACH;  Surgeon: Kathryne Hitch, MD;   Location: [facility];  Service: Orthopedics;  Laterality: Left;   Social History   Occupational History   Occupation: retired  Tobacco Use   Smoking status: Former    Packs/day: 1.00    Years: 30.00    Additional pack years: 0.00    Total pack years: 30.00    Types: Cigarettes    Quit date: 07/15/1999    Years since quitting: 23.5   Smokeless tobacco: Never  Vaping Use   Vaping Use: Never used  Substance and Sexual Activity   Alcohol use: Yes    Alcohol/week: 7.0 standard drinks of alcohol    Types: 5 Glasses of wine, 2 Shots of liquor per week    Comment: social, .5 per day   Drug use: No   Sexual activity: Not on file

## 2023-01-12 NOTE — Progress Notes (Unsigned)
Cardiology Office Note:   Date:  01/13/2023  NAME:  Keith Hernandez    MRN: [id number] DOB:  Sep 19, 1950   PCP:  Alysia Penna, MD  Cardiologist:  Reatha Harps, MD  Electrophysiologist:  None   Referring MD: Alysia Penna, MD   Chief Complaint  Patient presents with   Follow-up         History of Present Illness:   Keith Hernandez is a 72 y.o. male with a hx of atrial myxoma, pAF, minimal CAD who presents for follow-up.  He does report fatigue.  Pulse in the 60s.  Blood pressure has been well-controlled.  Remains on flecainide and metoprolol.  This could be contributing to his fatigue.  He reports occasional palpitations but nothing significant.  He wishes to come off flecainide.  I think this is reasonable to give a trial off this.  Has done well.  No significant bruising or bleeding on Eliquis.  Has a AAA that is followed by vascular surgery.  Seems to be stable.  Denies chest pains or trouble breathing.  Overall seems to be doing well.  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 Paroxysmal Afib -surgical PVI 8/24/82023 3. Coronary calcium 11 (20th percentile) -<25% stenosis CCTA 03/11/2022 4. HLD -T chol 126, HDL 43, LDL 71, TG 62 5. HTN 6. AAA -3.7 cm   Past Medical History: Past Medical History:  Diagnosis Date   AAA (abdominal aortic aneurysm) (HCC)    Anxiety    Arthritis    Bulging of cervical intervertebral disc    C6-7   Dupuytren's disease    GERD (gastroesophageal reflux disease)    OTC meds pnr   History of colon polyps    Hyperlipidemia    Hypertension    Migraines    Pneumonia    Sleep apnea     Past Surgical History: Past Surgical History:  Procedure Laterality Date   achilles reattachment     FASCIOTOMY Left 10/11/2015   Procedure: LEFT HAND SUBTOTAL PALMAR FASCIOTOMY WITH REPAIR RECONSTRUCTION ;  Surgeon: Dominica Severin, MD;  Location: [facility];  Service: Orthopedics;  Laterality: Left;   Left  Atrial Mass Excision  Left 05/07/2022   [facility] Dr. Zebedee Iba, with cardiac ablation   LESION DESTRUCTION Bilateral 07/29/2017   Procedure: EXCISION LIP LESION;  Surgeon: Peggye Form, DO;  Location: [facility];  Service: Plastics;  Laterality: Bilateral;   LIPOMA EXCISION Right 07/29/2017   Procedure: EXCISION BACK LIPOMA;  Surgeon: Peggye Form, DO;  Location: [facility];  Service: Plastics;  Laterality: Right;   MAZE Left 05/07/2022   [facility] Dr.Gaca   TOTAL HIP ARTHROPLASTY Left 09/30/2018   Procedure: LEFT TOTAL HIP ARTHROPLASTY ANTERIOR APPROACH;  Surgeon: Kathryne Hitch, MD;  Location: [facility];  Service: Orthopedics;  Laterality: Left;    Current Medications: Current Meds  Medication Sig   apixaban (ELIQUIS) 5 MG TABS tablet Take 1 tablet (5 mg total) by mouth 2 (two) times daily.   Cholecalciferol (VITAMIN D3 PO) Take 1 capsule by mouth daily.   LORazepam (ATIVAN) 0.5 MG tablet Take 0.5 mg by mouth 2 (two) times daily as needed.   Multiple Vitamin (MULTIVITAMIN) tablet Take 1 tablet by mouth daily.   nitroGLYCERIN (NITRODUR - DOSED IN MG/24 HR) 0.2 mg/hr patch Cut patch into fourths. Place 1/4 patch over affected area, change every 24 hours.   rosuvastatin (CRESTOR) 5 MG tablet TAKE 1 TABLET (  5 MG TOTAL) BY MOUTH DAILY.   triamcinolone (NASACORT) 55 MCG/ACT AERO nasal inhaler Place 1 spray into the nose daily as needed (for congestion).   [DISCONTINUED] flecainide (TAMBOCOR) 50 MG tablet TAKE 1 TABLET BY MOUTH TWICE A DAY   [DISCONTINUED] metoprolol succinate (TOPROL-XL) 50 MG 24 hr tablet Take 1 tablet (50 mg total) by mouth in the morning and at bedtime. Take with or immediately following a meal.     Allergies:    Pollen extract and Atorvastatin   Social History: Social History   Socioeconomic History   Marital status: Married    Spouse name: Not on file   Number of children: 0   Years of education: Not on file   Highest  education level: Not on file  Occupational History   Occupation: retired  Tobacco Use   Smoking status: Former    Packs/day: 1.00    Years: 30.00    Additional pack years: 0.00    Total pack years: 30.00    Types: Cigarettes    Quit date: 07/15/1999    Years since quitting: 23.5   Smokeless tobacco: Never  Vaping Use   Vaping Use: Never used  Substance and Sexual Activity   Alcohol use: Yes    Alcohol/week: 7.0 standard drinks of alcohol    Types: 5 Glasses of wine, 2 Shots of liquor per week    Comment: social, .5 per day   Drug use: No   Sexual activity: Not on file  Other Topics Concern   Not on file  Social History Narrative   Married and retired no children.  Former smoker one half an alcoholic beverage daily 1 caffeinated beverage daily no drug use no tobacco currently.   Social Determinants of Health   Financial Resource Strain: Not on file  Food Insecurity: Not on file  Transportation Needs: Not on file  Physical Activity: Not on file  Stress: Not on file  Social Connections: Not on file     Family History: The patient's family history includes Arthritis in his mother; Asthma in his father and paternal grandfather; Heart attack in his mother; Heart disease in his mother.  ROS:   All other ROS reviewed and negative. Pertinent positives noted in the HPI.     EKGs/Labs/Other Studies Reviewed:   The following studies were personally reviewed by me today:  EKG:  EKG is ordered today.  The ekg ordered today demonstrates normal sinus rhythm heart rate 61, no acute ischemic changes or evidence of infarction, and was personally reviewed by me.   TTE 06/22/2022  1. Left ventricular ejection fraction, by estimation, is 55 to 60%. The  left ventricle has normal function. The left ventricle has no regional  wall motion abnormalities. Left ventricular diastolic parameters were  normal.   2. Right ventricular systolic function is normal. The right ventricular  size is  normal.   3. The mitral valve is grossly normal. Mild mitral valve regurgitation.   4. The aortic valve is tricuspid. Aortic valve regurgitation is trivial.  Aortic valve sclerosis is present, with no evidence of aortic valve  stenosis.   5. Aortic dilatation noted. There is borderline dilatation of the aortic  root, measuring 39 mm. There is borderline dilatation of the ascending  aorta, measuring 39 mm.   6. The inferior vena cava is normal in size with <50% respiratory  variability, suggesting right atrial pressure of 8 mmHg.   Recent Labs: 06/19/2022: BUN 12; Creatinine, Ser 1.11; Hemoglobin 13.2; Platelets  191; Potassium 5.0; Sodium 140; TSH 1.150   Recent Lipid Panel No results found for: "CHOL", "TRIG", "HDL", "CHOLHDL", "VLDL", "LDLCALC", "LDLDIRECT"  Physical Exam:   VS:  BP 138/84   Pulse 61   Ht 5\' 11"  (1.803 m)   Wt 201 lb (91.2 kg)   SpO2 97%   BMI 28.03 kg/m    Wt Readings from Last 3 Encounters:  01/13/23 201 lb (91.2 kg)  12/24/22 204 lb (92.5 kg)  08/24/22 199 lb (90.3 kg)    General: Well nourished, well developed, in no acute distress Head: Atraumatic, normal size  Eyes: PEERLA, EOMI  Neck: Supple, no JVD Endocrine: No thryomegaly Cardiac: Normal S1, S2; RRR; no murmurs, rubs, or gallops Lungs: Clear to auscultation bilaterally, no wheezing, rhonchi or rales  Abd: Soft, nontender, no hepatomegaly  Ext: No edema, pulses 2+ Musculoskeletal: No deformities, BUE and BLE strength normal and equal Skin: Warm and dry, no rashes   Neuro: Alert and oriented to person, place, time, and situation, CNII-XII grossly intact, no focal deficits  Psych: Normal mood and affect   ASSESSMENT:   THARON BOMAR is a 72 y.o. male who presents for the following: 1. Paroxysmal atrial fibrillation (HCC)   2. H/O maze procedure   3. Atrial myxoma   4. Essential hypertension   5. Hypercholesterolemia   6. Irregular heart beat     PLAN:   1. Paroxysmal atrial fibrillation  (HCC) 2. H/O maze procedure -Paroxysmal atrial fibrillation.  Status post surgical maze at the time of left atrial myxoma excision.  Has been maintained on flecainide 50 mg twice daily.  Reports no episodes of A-fib.  Does not feel any rapid heartbeat or irregular heartbeat.  He is noticing fatigue.  I think it is worthwhile to give a trial off flecainide and see how he does.  We reduced his metoprolol succinate to 50 mg daily as well.  He will continue Eliquis.  No major issues with this.  CHA2DS2-VASc equals 2 (age, hypertension).  This is indicated.  3. Atrial myxoma -Status post left atrial myxoma excision at [facility].  Doing well.  We will likely plan to repeat an echo in 2 years just to make sure there is no recurrence.  4. Essential hypertension -BP seems to be well-controlled.  Stopping flecainide as above.  Reduce metoprolol succinate to 50 mg daily.  5. Hypercholesterolemia -Minimal CAD.  On a statin.  Most recent LDL at goal.     Disposition: Return in about 6 months (around 07/16/2023).  Medication Adjustments/Labs and Tests Ordered: Current medicines are reviewed at length with the patient today.  Concerns regarding medicines are outlined above.  No orders of the defined types were placed in this encounter.  Meds ordered this encounter  Medications   metoprolol succinate (TOPROL-XL) 50 MG 24 hr tablet    Sig: Take 1 tablet (50 mg total) by mouth daily. Take with or immediately following a meal.    Dispense:  90 tablet    Refill:  3    Patient Instructions  Medication Instructions:  STOP Flecainide  DECREASE Metoprolol to 50 mg once daily  *If you need a refill on your cardiac medications before your next appointment, please call your pharmacy*   Follow-Up: At [facility], you and your health needs are our priority.  As part of our continuing mission to provide you with exceptional heart care, we have created designated Provider Care Teams.  These Care Teams  include your primary Cardiologist (  physician) and Advanced Practice Providers (APPs -  Physician Assistants and Nurse Practitioners) who all work together to provide you with the care you need, when you need it.  We recommend signing up for the patient portal called "MyChart".  Sign up information is provided on this After Visit Summary.  MyChart is used to connect with patients for Virtual Visits (Telemedicine).  Patients are able to view lab/test results, encounter notes, upcoming appointments, etc.  Non-urgent messages can be sent to your provider as well.   To learn more about what you can do with MyChart, go to [email or web address].    Your next appointment:   6 month(s)  Provider:   Reatha Harps, MD        Time Spent with Patient: I have spent a total of 35 minutes with patient reviewing hospital notes, telemetry, EKGs, labs and examining the patient as well as establishing an assessment and plan that was discussed with the patient.  > 50% of time was spent in direct patient care.  Signed, Lenna Gilford. Flora Lipps, MD, [facility]  [facility]  [address], [address] [place], [place] [address] [phone]  01/13/2023 7:36 PM

## 2023-01-13 ENCOUNTER — Encounter: Payer: Self-pay | Admitting: Cardiovascular Disease

## 2023-01-13 ENCOUNTER — Ambulatory Visit: Payer: Medicare Other | Attending: Cardiovascular Disease | Admitting: Cardiovascular Disease

## 2023-01-13 VITALS — BP 138/84 | HR 61 | Ht 71.0 in | Wt 201.0 lb

## 2023-01-13 DIAGNOSIS — Z9889 Other specified postprocedural states: Secondary | ICD-10-CM

## 2023-01-13 DIAGNOSIS — I480 Paroxysmal atrial fibrillation: Secondary | ICD-10-CM | POA: Diagnosis not present

## 2023-01-13 DIAGNOSIS — E7800 Pure hypercholesterolemia, unspecified: Secondary | ICD-10-CM

## 2023-01-13 DIAGNOSIS — I499 Cardiac arrhythmia, unspecified: Secondary | ICD-10-CM

## 2023-01-13 DIAGNOSIS — D151 Benign neoplasm of heart: Secondary | ICD-10-CM

## 2023-01-13 DIAGNOSIS — I10 Essential (primary) hypertension: Secondary | ICD-10-CM

## 2023-01-13 MED ORDER — METOPROLOL SUCCINATE ER 50 MG PO TB24: 50.0000 mg | ORAL_TABLET | Freq: Every day | ORAL | 3 refills | Status: DC

## 2023-01-13 NOTE — Patient Instructions (Signed)
Medication Instructions:  STOP Flecainide  DECREASE Metoprolol to 50 mg once daily  *If you need a refill on your cardiac medications before your next appointment, please call your pharmacy*   Follow-Up: At [facility], you and your health needs are our priority.  As part of our continuing mission to provide you with exceptional heart care, we have created designated Provider Care Teams.  These Care Teams include your primary Cardiologist (physician) and Advanced Practice Providers (APPs -  Physician Assistants and Nurse Practitioners) who all work together to provide you with the care you need, when you need it.  We recommend signing up for the patient portal called "MyChart".  Sign up information is provided on this After Visit Summary.  MyChart is used to connect with patients for Virtual Visits (Telemedicine).  Patients are able to view lab/test results, encounter notes, upcoming appointments, etc.  Non-urgent messages can be sent to your provider as well.   To learn more about what you can do with MyChart, go to [email or web address].    Your next appointment:   6 month(s)  Provider:   Reatha Harps, MD

## 2023-01-15 ENCOUNTER — Ambulatory Visit: Payer: Medicare Other | Admitting: Physical Medicine and Rehabilitation

## 2023-01-15 DIAGNOSIS — S76812A Strain of other specified muscles, fascia and tendons at thigh level, left thigh, initial encounter: Secondary | ICD-10-CM | POA: Diagnosis not present

## 2023-01-15 DIAGNOSIS — R202 Paresthesia of skin: Secondary | ICD-10-CM

## 2023-01-15 DIAGNOSIS — M25552 Pain in left hip: Secondary | ICD-10-CM

## 2023-01-15 DIAGNOSIS — S76819A Strain of other specified muscles, fascia and tendons at thigh level, unspecified thigh, initial encounter: Secondary | ICD-10-CM

## 2023-01-15 DIAGNOSIS — Z96642 Presence of left artificial hip joint: Secondary | ICD-10-CM | POA: Diagnosis not present

## 2023-01-15 DIAGNOSIS — R29898 Other symptoms and signs involving the musculoskeletal system: Secondary | ICD-10-CM

## 2023-01-15 NOTE — Progress Notes (Signed)
Functional Pain Scale - descriptive words and definitions  Mild (2)   Noticeable when not distracted/no impact on ADL's/sleep only slightly affected and able to   use both passive and active distraction for comfort. Mild range order  Average Pain 2  Pain in left leg at the front of the thigh

## 2023-01-20 ENCOUNTER — Encounter: Payer: Self-pay | Admitting: Sports Medicine

## 2023-01-20 ENCOUNTER — Ambulatory Visit: Payer: Medicare Other | Admitting: Sports Medicine

## 2023-01-20 DIAGNOSIS — R29898 Other symptoms and signs involving the musculoskeletal system: Secondary | ICD-10-CM | POA: Diagnosis not present

## 2023-01-20 DIAGNOSIS — Z96642 Presence of left artificial hip joint: Secondary | ICD-10-CM

## 2023-01-20 DIAGNOSIS — S76819A Strain of other specified muscles, fascia and tendons at thigh level, unspecified thigh, initial encounter: Secondary | ICD-10-CM

## 2023-01-20 NOTE — Progress Notes (Signed)
RODERT Hernandez - 72 y.o. male MRN [id number]  Date of birth: 12-04-1950  Office Visit Note: Visit Date: 01/20/2023 PCP: Alysia Penna, MD Referred by: Alysia Penna, MD  Subjective: Chief Complaint  Patient presents with   Right Hip - Follow-up   HPI: Keith Hernandez is a pleasant 72 y.o. male who presents today for follow-up of right hip pain.  *Did undergo EMG/nerve conduction study with Dr. Alvester Morin on 01/15/2023.  Did review these with Onalee Hua as well as Dr. Alvester Morin which showed no muscular or neurological abnormalities to explain his left hip pain and weakness.  Deklin does note that he does feel like he has made some improvements over the last few days with the left anterior hip.  Notes that with the home hip exercises that he is able to do more reps without soreness or fatigability than he has in weeks past.  Has modified the internal and external rotation which was initially causing him pain.  He continues with the nitroglycerin patch changing once daily.  Pertinent ROS were reviewed with the patient and found to be negative unless otherwise specified above in HPI.   Assessment & Plan: Visit Diagnoses:  1. Strain of rectus femoris muscle   2. Weakness of left hip   3. History of left hip replacement    Plan: Discussed with Iyan that his EMG/nerve conduction studies were reassuring against any muscle weakness or neural abnormality.  Over the last week he has certainly noticed improvement in his durability and endurance with performing home exercises.  Noticing improvement in pain as well as mild strength improvements.  We did proceed decision making elected to proceed with an additional treatment of extracorporeal shockwave therapy.  He will continue his home hip strengthening exercises, we discussed appropriate modification today.  He will continue his nitroglycerin patch 0.2 mg/h protocol changing once daily.  We will plan to keep this for about 10-12 weeks, this was started on  12/23/2022.  He will follow-up in 1 week for repeat ESWT.  Follow-up: Return in about 1 week (around 01/27/2023) for L-hip follow-up.   Meds & Orders: No orders of the defined types were placed in this encounter.  No orders of the defined types were placed in this encounter.    Procedures: Procedure: ECSWT Indications:  Rectus femoris tendinopathy   Procedure Details Consent: Risks of procedure as well as the alternatives and risks of each were explained to the patient.  Verbal consent for procedure obtained. Time Out: Verified patient identification, verified procedure, site was marked, verified correct patient position. The area was cleaned with alcohol swab.     The Left rectus femoris tendon and anterolateral hip was targeted for Extracorporeal shockwave therapy.    Preset: Status post muscular injury Power Level: 120 mJ Frequency: 14-16 Hz Impulse/cycles: 3500   Head size: Regular   Patient tolerated procedure well without immediate complications.       Clinical History: MRI Left Hip IMPRESSION: 1. Status post total left hip arthroplasty. Within the limitation of metallic susceptibility artifact, no large left hip joint effusion, osteolysis or pseudotumor is identified. 2. Moderate right femoroacetabular osteoarthritis, greatest at the anterior superior aspect.     Electronically Signed   By: Keith Hernandez M.D.   On: 12/23/2021 12:39  He reports that he quit smoking about 23 years ago. His smoking use included cigarettes. He has a 30.00 pack-year smoking history. He has never used smokeless tobacco. No results for input(s): "HGBA1C", "LABURIC" in the last  8760 hours.  Objective:   Vital Signs: There were no vitals taken for this visit.  Physical Exam  Gen: Well-appearing, in no acute distress; non-toxic CV: Well-perfused. Warm.  Resp: Breathing unlabored on room air; no wheezing. Psych: Fluid speech in conversation; appropriate affect; normal thought  process Neuro: Sensation intact throughout. No gross coordination deficits.   Ortho Exam - Left hip: No redness or swelling.  There is some mild weakness with hip flexion which I think is multifactorial with weakness secondary to pain/guarding and some mild weakness overall.  This does appear slightly improved from prior visits.  Imaging: No results found.  Past Medical/Family/Surgical/Social History: Medications & Allergies reviewed per EMR, new medications updated. Patient Active Problem List   Diagnosis Date Noted   Irregular heart beat 04/03/2022   Atrial tachycardia 04/03/2022   Atherosclerosis of coronary artery 04/03/2022   Foreign body in respiratory tract 02/05/2020   Status post total replacement of left hip 09/30/2018   Unilateral primary osteoarthritis, left hip 07/27/2018   Lipoma 03/23/2017   Dysfunction of right eustachian tube 11/26/2015   Seasonal allergic rhinitis 11/26/2015   Changing skin lesion 09/20/2015   Migraine, unspecified, not intractable, without status migrainosus 04/29/2015   Hyperlipidemia 04/29/2015   Gastro-esophageal reflux disease without esophagitis 04/29/2015   Essential hypertension 04/29/2015   Contracture of palmar fascia 04/29/2015   Abdominal aortic aneurysm without rupture (HCC) 04/29/2015   Past Medical History:  Diagnosis Date   AAA (abdominal aortic aneurysm) (HCC)    Anxiety    Arthritis    Bulging of cervical intervertebral disc    C6-7   Dupuytren's disease    GERD (gastroesophageal reflux disease)    OTC meds pnr   History of colon polyps    Hyperlipidemia    Hypertension    Migraines    Pneumonia    Sleep apnea    Family History  Problem Relation Age of Onset   Arthritis Mother    Heart disease Mother    Heart attack Mother    Asthma Father    Asthma Paternal Grandfather    Past Surgical History:  Procedure Laterality Date   achilles reattachment     FASCIOTOMY Left 10/11/2015   Procedure: LEFT HAND  SUBTOTAL PALMAR FASCIOTOMY WITH REPAIR RECONSTRUCTION ;  Surgeon: Dominica Severin, MD;  Location: [facility];  Service: Orthopedics;  Laterality: Left;   Left Atrial Mass Excision  Left 05/07/2022   [facility] Dr. Zebedee Iba, with cardiac ablation   LESION DESTRUCTION Bilateral 07/29/2017   Procedure: EXCISION LIP LESION;  Surgeon: Peggye Form, DO;  Location: [facility];  Service: Plastics;  Laterality: Bilateral;   LIPOMA EXCISION Right 07/29/2017   Procedure: EXCISION BACK LIPOMA;  Surgeon: Peggye Form, DO;  Location: [facility];  Service: Plastics;  Laterality: Right;   MAZE Left 05/07/2022   [facility] Dr.Gaca   TOTAL HIP ARTHROPLASTY Left 09/30/2018   Procedure: LEFT TOTAL HIP ARTHROPLASTY ANTERIOR APPROACH;  Surgeon: Kathryne Hitch, MD;  Location: [facility];  Service: Orthopedics;  Laterality: Left;   Social History   Occupational History   Occupation: retired  Tobacco Use   Smoking status: Former    Packs/day: 1.00    Years: 30.00    Additional pack years: 0.00    Total pack years: 30.00    Types: Cigarettes    Quit date: 07/15/1999    Years since quitting: 23.5   Smokeless tobacco: Never  Vaping Use   Vaping Use:  Never used  Substance and Sexual Activity   Alcohol use: Yes    Alcohol/week: 7.0 standard drinks of alcohol    Types: 5 Glasses of wine, 2 Shots of liquor per week    Comment: social, .5 per day   Drug use: No   Sexual activity: Not on file   I spent 34 minutes in the care of the patient today including face-to-face time, preparation to see the patient, as well as review and discussion of his EMG/nerve conduction studies, counseling and guidance on his home exercise plan and rehab activities, review of previous ultrasound and discussion on further shockwave treatment therapy for the above diagnoses.   Madelyn Brunner, DO Primary Care Sports Medicine Physician  [facility] - Orthopedics  This note was  dictated using Dragon naturally speaking software and may contain errors in syntax, spelling, or content which have not been identified prior to signing this note.

## 2023-01-20 NOTE — Progress Notes (Signed)
Doing somewhat better; cannot tell if shockwave is helping much, but his pain is better

## 2023-01-21 DIAGNOSIS — D225 Melanocytic nevi of trunk: Secondary | ICD-10-CM | POA: Diagnosis not present

## 2023-01-21 DIAGNOSIS — D2262 Melanocytic nevi of left upper limb, including shoulder: Secondary | ICD-10-CM | POA: Diagnosis not present

## 2023-01-21 DIAGNOSIS — Z85828 Personal history of other malignant neoplasm of skin: Secondary | ICD-10-CM | POA: Diagnosis not present

## 2023-01-21 DIAGNOSIS — L820 Inflamed seborrheic keratosis: Secondary | ICD-10-CM | POA: Diagnosis not present

## 2023-01-21 DIAGNOSIS — H52203 Unspecified astigmatism, bilateral: Secondary | ICD-10-CM | POA: Diagnosis not present

## 2023-01-21 DIAGNOSIS — L570 Actinic keratosis: Secondary | ICD-10-CM | POA: Diagnosis not present

## 2023-01-21 DIAGNOSIS — L821 Other seborrheic keratosis: Secondary | ICD-10-CM | POA: Diagnosis not present

## 2023-01-21 DIAGNOSIS — D1801 Hemangioma of skin and subcutaneous tissue: Secondary | ICD-10-CM | POA: Diagnosis not present

## 2023-01-25 NOTE — Addendum Note (Signed)
Addended by: Brunetta Genera on: 01/25/2023 08:13 AM   Modules accepted: Orders

## 2023-01-25 NOTE — Procedures (Signed)
EMG & NCV Findings: Evaluation of the left sural sensory nerve showed reduced amplitude (4.4 V).  All remaining nerves (as indicated in the following tables) were within normal limits.    All examined muscles (as indicated in the following table) showed no evidence of electrical instability.    Impression: Essentially NORMAL electrodiagnostic study of the left lower limb.  There is no significant electrodiagnostic evidence of nerve entrapment, lumbosacral plexopathy or lumbar radiculopathy.    As you know, purely sensory or demyelinating radiculopathies and chemical radiculitis may not be detected with this particular electrodiagnostic study.  Recommendations: 1.  Follow-up with referring physician.  2.  Continue current management of symptoms.  Symptoms still seem to me to be more mechanical in nature and there clearly does not seem to be any major nerve issue that we can detect.  ___________________________ Elease Hashimoto Board Certified, American Board of Physical Medicine and Rehabilitation    Nerve Conduction Studies Anti Sensory Summary Table   Stim Site NR Peak (ms) Norm Peak (ms) P-T Amp (V) Norm P-T Amp Site1 Site2 Delta-P (ms) Dist (cm) Vel (m/s) Norm Vel (m/s)  Left Saphenous Anti Sensory (Ant Med Mall)  28.4C  14cm    4.4 <4.4 7.1 >2 14cm Ant Med Mall 4.4 0.0  >32  Left Sup Fibular Anti Sensory (Ant Lat Mall)  28.6C  14 cm    4.1 <4.4 8.4 >5.0 14 cm Ant Lat Mall 4.1 14.0 34 >32  Left Sural Anti Sensory (Lat Mall)  28.5C  Calf    3.8 <4.0 *4.4 >5.0 Calf Lat Mall 3.8 14.0 37 >35   Motor Summary Table   Stim Site NR Onset (ms) Norm Onset (ms) O-P Amp (mV) Norm O-P Amp Site1 Site2 Delta-0 (ms) Dist (cm) Vel (m/s) Norm Vel (m/s)  Left Fibular Motor (Ext Dig Brev)  28.8C  Ankle    3.6 <6.1 3.5 >2.5 B Fib Ankle 7.8 36.0 46 >38  B Fib    11.4  3.7  Poplt B Fib 2.0 8.0 40 >40  Poplt    13.4  3.5         Left Tibial Motor (Abd Hall Brev)  28.7C  Ankle    4.3 <6.1  10.5 >3.0 Knee Ankle 9.7 43.0 44 >35  Knee    14.0  7.8          EMG   Side Muscle Nerve Root Ins Act Fibs Psw Amp Dur Poly Recrt Int Dennie Bible Comment  Left AntTibialis Dp Br Peron L4-5 Nml Nml Nml Nml Nml 0 Nml Nml   Left Fibularis Longus  Sup Br Peron L5-S1 Nml Nml Nml Nml Nml 0 Nml Nml   Left MedGastroc Tibial S1-2 Nml Nml Nml Nml Nml 0 Nml Nml   Left VastusMed Femoral L2-4 Nml Nml Nml Nml Nml 0 Nml Nml   Left BicepsFemS Sciatic L5-S1 Nml Nml Nml Nml Nml 0 Nml Nml     Nerve Conduction Studies Anti Sensory Left/Right Comparison   Stim Site L Lat (ms) R Lat (ms) L-R Lat (ms) L Amp (V) R Amp (V) L-R Amp (%) Site1 Site2 L Vel (m/s) R Vel (m/s) L-R Vel (m/s)  Saphenous Anti Sensory (Ant Med Mall)  28.4C  14cm 4.4   7.1   14cm Ant Med Mall     Sup Fibular Anti Sensory (Ant Lat Mall)  28.6C  14 cm 4.1   8.4   14 cm Ant Lat Mall 34    Sural Anti Sensory (Lat  Mall)  28.5C  Calf 3.8   *4.4   Calf Lat Mall 37     Motor Left/Right Comparison   Stim Site L Lat (ms) R Lat (ms) L-R Lat (ms) L Amp (mV) R Amp (mV) L-R Amp (%) Site1 Site2 L Vel (m/s) R Vel (m/s) L-R Vel (m/s)  Fibular Motor (Ext Dig Brev)  28.8C  Ankle 3.6   3.5   B Fib Ankle 46    B Fib 11.4   3.7   Poplt B Fib 40    Poplt 13.4   3.5         Tibial Motor (Abd Hall Brev)  28.7C  Ankle 4.3   10.5   Knee Ankle 44    Knee 14.0   7.8            Waveforms:

## 2023-01-25 NOTE — Progress Notes (Signed)
[name] - 72 y.o. male MRN [id number]  Date of birth: 24-Jul-1951  Office Visit Note: Visit Date: 01/15/2023 PCP: Alysia Penna, MD Referred by: Kathryne Hitch*  Subjective: Chief Complaint  Patient presents with   Left Leg - Pain   HPI: Keith Hernandez is a 72 y.o. male who comes in today for evaluation and management at the request of Dr. Doneen Poisson for continued complicated course post total hip replacement with continued anterior thigh pain and weakness particularly with movement in and out of cars etc.  His course is complicated but briefly he had a left hip replacement January 2020 by Dr. Doneen Poisson.  Had some continued symptoms after that with extensive physical therapy and at least at 1 point through extended physical therapy and time had gotten to a point where he was doing fairly well.  He then had some heart issues and now is back to where he is having significant symptoms in the left thigh with weakness despite aggressive physical therapy.  He denies any real paresthesias.  No distal weakness.  Has some back pain but no prior lumbar spine issues.  He had workup with MRI of the left hip post replacement without any real findings.  Had ultrasound-guided injection of the iliopsoas without much relief.  He has most recently been seen and evaluated by Dr. 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  Subsequent injection and shockwave therapy really did not show much relief overall.  He rates his pain is only a 2 mile that its mainly the disability is having with feeling of weakness and wanting to get back doing regular activities.   I spent more than 30 minutes speaking face-to-face with the patient with 50% of the time in counseling and discussing coordination of care.      Review of Systems  Musculoskeletal:  Positive for back pain and joint pain.  Neurological:   Positive for weakness.  All other systems reviewed and are negative.  Otherwise per HPI.  Assessment & Plan: Visit Diagnoses:    ICD-10-CM   1. Paresthesia of skin  R20.2 NCV with EMG (electromyography)    2. Pain in left hip  M25.552     3. History of left hip replacement  Z96.642     4. Strain of rectus femoris muscle  S76.819A     5. Weakness of left hip  R29.898        Plan: Impression: Clinically his symptoms are vague in his sense of weakness with the movement and ability to do things and not so much focal weakness on exam at least to my exam.  No specific atrophy noted.  He is very concerned about his level of ability to do things and his pain level is quite low at this point but still affecting his daily activities.  Electrodiagnostic study performed.  Essentially NORMAL electrodiagnostic study of the left lower limb.  There is no significant electrodiagnostic evidence of nerve entrapment, lumbosacral plexopathy or lumbar radiculopathy.    As you know, purely sensory or demyelinating radiculopathies and chemical radiculitis may not be detected with this particular electrodiagnostic study.  Recommendations: 1.  Follow-up with referring physician.  2.  Continue current management of symptoms.  Symptoms still seem to me to be more mechanical in nature and there clearly does not seem to be any major nerve issue that we can detect.  Meds & Orders: No orders of the  defined types were placed in this encounter.   Orders Placed This Encounter  Procedures   NCV with EMG (electromyography)    Follow-up: No follow-ups on file.   Procedures: No procedures performed  EMG & NCV Findings: Evaluation of the left sural sensory nerve showed reduced amplitude (4.4 V).  All remaining nerves (as indicated in the following tables) were within normal limits.    All examined muscles (as indicated in the following table) showed no evidence of electrical instability.     Impression: Essentially NORMAL electrodiagnostic study of the left lower limb.  There is no significant electrodiagnostic evidence of nerve entrapment, lumbosacral plexopathy or lumbar radiculopathy.    As you know, purely sensory or demyelinating radiculopathies and chemical radiculitis may not be detected with this particular electrodiagnostic study.  Recommendations: 1.  Follow-up with referring physician.  2.  Continue current management of symptoms.  Symptoms still seem to me to be more mechanical in nature and there clearly does not seem to be any major nerve issue that we can detect.  ___________________________ Elease Hashimoto Board Certified, American Board of Physical Medicine and Rehabilitation    Nerve Conduction Studies Anti Sensory Summary Table   Stim Site NR Peak (ms) Norm Peak (ms) P-T Amp (V) Norm P-T Amp Site1 Site2 Delta-P (ms) Dist (cm) Vel (m/s) Norm Vel (m/s)  Left Saphenous Anti Sensory (Ant Med Mall)  28.4C  14cm    4.4 <4.4 7.1 >2 14cm Ant Med Mall 4.4 0.0  >32  Left Sup Fibular Anti Sensory (Ant Lat Mall)  28.6C  14 cm    4.1 <4.4 8.4 >5.0 14 cm Ant Lat Mall 4.1 14.0 34 >32  Left Sural Anti Sensory (Lat Mall)  28.5C  Calf    3.8 <4.0 *4.4 >5.0 Calf Lat Mall 3.8 14.0 37 >35   Motor Summary Table   Stim Site NR Onset (ms) Norm Onset (ms) O-P Amp (mV) Norm O-P Amp Site1 Site2 Delta-0 (ms) Dist (cm) Vel (m/s) Norm Vel (m/s)  Left Fibular Motor (Ext Dig Brev)  28.8C  Ankle    3.6 <6.1 3.5 >2.5 B Fib Ankle 7.8 36.0 46 >38  B Fib    11.4  3.7  Poplt B Fib 2.0 8.0 40 >40  Poplt    13.4  3.5         Left Tibial Motor (Abd Hall Brev)  28.7C  Ankle    4.3 <6.1 10.5 >3.0 Knee Ankle 9.7 43.0 44 >35  Knee    14.0  7.8          EMG   Side Muscle Nerve Root Ins Act Fibs Psw Amp Dur Poly Recrt Int Dennie Bible Comment  Left AntTibialis Dp Br Peron L4-5 Nml Nml Nml Nml Nml 0 Nml Nml   Left Fibularis Longus  Sup Br Peron L5-S1 Nml Nml Nml Nml Nml 0 Nml Nml   Left  MedGastroc Tibial S1-2 Nml Nml Nml Nml Nml 0 Nml Nml   Left VastusMed Femoral L2-4 Nml Nml Nml Nml Nml 0 Nml Nml   Left BicepsFemS Sciatic L5-S1 Nml Nml Nml Nml Nml 0 Nml Nml     Nerve Conduction Studies Anti Sensory Left/Right Comparison   Stim Site L Lat (ms) R Lat (ms) L-R Lat (ms) L Amp (V) R Amp (V) L-R Amp (%) Site1 Site2 L Vel (m/s) R Vel (m/s) L-R Vel (m/s)  Saphenous Anti Sensory (Ant Med Mall)  28.4C  14cm 4.4   7.1   14cm Ant  Med Mall     Sup Fibular Anti Sensory (Ant Lat Mall)  28.6C  14 cm 4.1   8.4   14 cm Ant Lat Mall 34    Sural Anti Sensory (Lat Mall)  28.5C  Calf 3.8   *4.4   Calf Lat Mall 37     Motor Left/Right Comparison   Stim Site L Lat (ms) R Lat (ms) L-R Lat (ms) L Amp (mV) R Amp (mV) L-R Amp (%) Site1 Site2 L Vel (m/s) R Vel (m/s) L-R Vel (m/s)  Fibular Motor (Ext Dig Brev)  28.8C  Ankle 3.6   3.5   B Fib Ankle 46    B Fib 11.4   3.7   Poplt B Fib 40    Poplt 13.4   3.5         Tibial Motor (Abd Hall Brev)  28.7C  Ankle 4.3   10.5   Knee Ankle 44    Knee 14.0   7.8            Waveforms:             Clinical History: MRI Left Hip IMPRESSION: 1. Status post total left hip arthroplasty. Within the limitation of metallic susceptibility artifact, no large left hip joint effusion, osteolysis or pseudotumor is identified. 2. Moderate right femoroacetabular osteoarthritis, greatest at the anterior superior aspect.     Electronically Signed   By: Neita Garnet M.D.   On: 12/23/2021 12:39   He reports that he quit smoking about 23 years ago. His smoking use included cigarettes. He has a 30.00 pack-year smoking history. He has never used smokeless tobacco. No results for input(s): "HGBA1C", "LABURIC" in the last 8760 hours.  Objective:  VS:  HT:    WT:   BMI:     BP:   HR: bpm  TEMP: ( )  RESP:  Physical Exam Vitals and nursing note reviewed.  Constitutional:      General: He is not in acute distress.    Appearance: Normal  appearance. He is well-developed.  HENT:     Head: Normocephalic and atraumatic.  Eyes:     Conjunctiva/sclera: Conjunctivae normal.     Pupils: Pupils are equal, round, and reactive to light.  Cardiovascular:     Rate and Rhythm: Normal rate.     Pulses: Normal pulses.     Heart sounds: Normal heart sounds.  Pulmonary:     Effort: Pulmonary effort is normal. No respiratory distress.  Musculoskeletal:        General: Tenderness present.     Cervical back: Normal range of motion and neck supple. No rigidity.     Right lower leg: No edema.     Left lower leg: No edema.     Comments: He has good range of motion of both hips with no groin pain.  No pain over the greater trochanter or tensor fascia lata.  At least 2 instantaneous strength is 5 out of 5 bilaterally with hip flexor and knee flexion extension.  No obvious muscle atrophy.  Skin:    General: Skin is warm and dry.     Findings: No erythema or rash.  Neurological:     General: No focal deficit present.     Mental Status: He is alert and oriented to person, place, and time.     Cranial Nerves: No cranial nerve deficit.     Sensory: No sensory deficit.     Motor: No weakness.  Coordination: Coordination normal.     Gait: Gait normal.  Psychiatric:        Mood and Affect: Mood normal.        Behavior: Behavior normal.     Ortho Exam  Imaging: No results found.  Past Medical/Family/Surgical/Social History: Medications & Allergies reviewed per EMR, new medications updated. Patient Active Problem List   Diagnosis Date Noted   Muscle spasms of neck 05/09/2022   Anxiety 05/06/2022   Irregular heart beat 04/03/2022   Atrial tachycardia 04/03/2022   Atherosclerosis of coronary artery 04/03/2022   Foreign body in respiratory tract 02/05/2020   Status post total replacement of left hip 09/30/2018   Unilateral primary osteoarthritis, left hip 07/27/2018   Lipoma 03/23/2017   Dysfunction of right eustachian tube  11/26/2015   Seasonal allergic rhinitis 11/26/2015   Changing skin lesion 09/20/2015   Migraine, unspecified, not intractable, without status migrainosus 04/29/2015   Hyperlipidemia 04/29/2015   Gastro-esophageal reflux disease without esophagitis 04/29/2015   Essential hypertension 04/29/2015   Contracture of palmar fascia 04/29/2015   Abdominal aortic aneurysm without rupture (HCC) 04/29/2015   Past Medical History:  Diagnosis Date   AAA (abdominal aortic aneurysm) (HCC)    Anxiety    Arthritis    Bulging of cervical intervertebral disc    C6-7   Dupuytren's disease    GERD (gastroesophageal reflux disease)    OTC meds pnr   History of colon polyps    Hyperlipidemia    Hypertension    Migraines    Pneumonia    Sleep apnea    Family History  Problem Relation Age of Onset   Arthritis Mother    Heart disease Mother    Heart attack Mother    Asthma Father    Asthma Paternal Grandfather    Past Surgical History:  Procedure Laterality Date   achilles reattachment     FASCIOTOMY Left 10/11/2015   Procedure: LEFT HAND SUBTOTAL PALMAR FASCIOTOMY WITH REPAIR RECONSTRUCTION ;  Surgeon: Dominica Severin, MD;  Location: [facility];  Service: Orthopedics;  Laterality: Left;   Left Atrial Mass Excision  Left 05/07/2022   [facility] Dr. Zebedee Iba, with cardiac ablation   LESION DESTRUCTION Bilateral 07/29/2017   Procedure: EXCISION LIP LESION;  Surgeon: Peggye Form, DO;  Location: [facility];  Service: Plastics;  Laterality: Bilateral;   LIPOMA EXCISION Right 07/29/2017   Procedure: EXCISION BACK LIPOMA;  Surgeon: Peggye Form, DO;  Location: [facility];  Service: Plastics;  Laterality: Right;   MAZE Left 05/07/2022   [facility] Dr.Gaca   TOTAL HIP ARTHROPLASTY Left 09/30/2018   Procedure: LEFT TOTAL HIP ARTHROPLASTY ANTERIOR APPROACH;  Surgeon: Kathryne Hitch, MD;  Location: [facility];  Service: Orthopedics;  Laterality: Left;    Social History   Occupational History   Occupation: retired  Tobacco Use   Smoking status: Former    Packs/day: 1.00    Years: 30.00    Additional pack years: 0.00    Total pack years: 30.00    Types: Cigarettes    Quit date: 07/15/1999    Years since quitting: 23.5   Smokeless tobacco: Never  Vaping Use   Vaping Use: Never used  Substance and Sexual Activity   Alcohol use: Yes    Alcohol/week: 7.0 standard drinks of alcohol    Types: 5 Glasses of wine, 2 Shots of liquor per week    Comment: social, .5 per day   Drug use: No  Sexual activity: Not on file

## 2023-01-28 ENCOUNTER — Ambulatory Visit: Payer: Medicare Other | Admitting: Sports Medicine

## 2023-01-28 ENCOUNTER — Encounter: Payer: Self-pay | Admitting: Sports Medicine

## 2023-01-28 DIAGNOSIS — Z96642 Presence of left artificial hip joint: Secondary | ICD-10-CM | POA: Diagnosis not present

## 2023-01-28 DIAGNOSIS — R29898 Other symptoms and signs involving the musculoskeletal system: Secondary | ICD-10-CM | POA: Diagnosis not present

## 2023-01-28 DIAGNOSIS — S76819A Strain of other specified muscles, fascia and tendons at thigh level, unspecified thigh, initial encounter: Secondary | ICD-10-CM

## 2023-01-28 DIAGNOSIS — S76812A Strain of other specified muscles, fascia and tendons at thigh level, left thigh, initial encounter: Secondary | ICD-10-CM | POA: Diagnosis not present

## 2023-01-28 NOTE — Progress Notes (Signed)
[name] - 72 y.o. male MRN [id number]  Date of birth: [birth year]  Office Visit Note: Visit Date: 01/28/2023 PCP: Alysia Penna, MD Referred by: Alysia Penna, MD  Subjective: Chief Complaint  Patient presents with   Left Hip - Follow-up   HPI: Keith Hernandez is a pleasant 72 y.o. male who presents today for follow-up of left hip pain.  He really feels like he is starting to turn the corner, has noticed at least 30-40% improvement from the prior week.  Improving function and pain.  Has been able to ramp up the home exercises, performing them once daily. Has had 4 treatment sessions of ECSWT Started Nitroglycerin patch on 12/22/22.  Pertinent ROS were reviewed with the patient and found to be negative unless otherwise specified above in HPI.   Assessment & Plan: Visit Diagnoses:  1. Strain of rectus femoris muscle   2. Weakness of left hip   3. History of left hip replacement    Plan: Discussed with Ashvath that we are both pleased with the improvement he has been making from extracorporeal shockwave therapy as well as his home hip rehab exercises.  We are noticing reduction in his pain and improvement in hip flexion based exercises.  We will continue this protocol, he will follow-up in 1 week for 1 additional ESWT.  At that point I think we will take a short 3-4 week hiatus from ESWT treatment to see his continued benefit with HEP.  He is tolerating the nitroglycerin patch 0.2 minutes/h daily, he will continue this.  Likely will continue this for the 10-12 weeks mark total.  Follow-up in 1 week.  Follow-up: Return in about 1 week (around 02/04/2023) for for L-hip.   Meds & Orders: No orders of the defined types were placed in this encounter.  No orders of the defined types were placed in this encounter.    Procedures: Procedure: ECSWT Indications:  Rectus femoris tendinopathy   Procedure Details Consent: Risks of procedure as well as the alternatives and risks of each were  explained to the patient.  Verbal consent for procedure obtained. Time Out: Verified patient identification, verified procedure, site was marked, verified correct patient position. The area was cleaned with alcohol swab.     The Left rectus femoris tendon and anterolateral hip was targeted for Extracorporeal shockwave therapy.    Preset: Status post muscular injury Power Level: 120 mJ  Frequency: 15 Hz Impulse/cycles: 3400   Head size: Regular   Patient tolerated procedure well without immediate complications.       Clinical History: MRI Left Hip IMPRESSION: 1. Status post total left hip arthroplasty. Within the limitation of metallic susceptibility artifact, no large left hip joint effusion, osteolysis or pseudotumor is identified. 2. Moderate right femoroacetabular osteoarthritis, greatest at the anterior superior aspect.     Electronically Signed   By: Neita Garnet M.D.   On: 12/23/2021 12:39  He reports that he quit smoking about 23 years ago. His smoking use included cigarettes. He has a 30.00 pack-year smoking history. He has never used smokeless tobacco. No results for input(s): "HGBA1C", "LABURIC" in the last 8760 hours.  Objective:   Vital Signs: There were no vitals taken for this visit.  Physical Exam  Gen: Well-appearing, in no acute distress; non-toxic CV: Regular Rate. Well-perfused. Warm.  Resp: Breathing unlabored on room air; no wheezing. Psych: Fluid speech in conversation; appropriate affect; normal thought process Neuro: Sensation intact throughout. No gross coordination deficits.   Ortho  Exam - Left hip: Well-healed incision from a prior THA without signs of infection.  No acute findings.  Imaging: No results found.  Past Medical/Family/Surgical/Social History: Medications & Allergies reviewed per EMR, new medications updated. Patient Active Problem List   Diagnosis Date Noted   Muscle spasms of neck 05/09/2022   Anxiety 05/06/2022   Irregular  heart beat 04/03/2022   Atrial tachycardia 04/03/2022   Atherosclerosis of coronary artery 04/03/2022   Foreign body in respiratory tract 02/05/2020   Status post total replacement of left hip 09/30/2018   Unilateral primary osteoarthritis, left hip 07/27/2018   Lipoma 03/23/2017   Dysfunction of right eustachian tube 11/26/2015   Seasonal allergic rhinitis 11/26/2015   Changing skin lesion 09/20/2015   Migraine, unspecified, not intractable, without status migrainosus 04/29/2015   Hyperlipidemia 04/29/2015   Gastro-esophageal reflux disease without esophagitis 04/29/2015   Essential hypertension 04/29/2015   Contracture of palmar fascia 04/29/2015   Abdominal aortic aneurysm without rupture (HCC) 04/29/2015   Past Medical History:  Diagnosis Date   AAA (abdominal aortic aneurysm) (HCC)    Anxiety    Arthritis    Bulging of cervical intervertebral disc    C6-7   Dupuytren's disease    GERD (gastroesophageal reflux disease)    OTC meds pnr   History of colon polyps    Hyperlipidemia    Hypertension    Migraines    Pneumonia    Sleep apnea    Family History  Problem Relation Age of Onset   Arthritis Mother    Heart disease Mother    Heart attack Mother    Asthma Father    Asthma Paternal Grandfather    Past Surgical History:  Procedure Laterality Date   achilles reattachment     FASCIOTOMY Left 10/11/2015   Procedure: LEFT HAND SUBTOTAL PALMAR FASCIOTOMY WITH REPAIR RECONSTRUCTION ;  Surgeon: Dominica Severin, MD;  Location: [facility];  Service: Orthopedics;  Laterality: Left;   Left Atrial Mass Excision  Left 05/07/2022   [facility] Dr. Zebedee Iba, with cardiac ablation   LESION DESTRUCTION Bilateral 07/29/2017   Procedure: EXCISION LIP LESION;  Surgeon: Peggye Form, DO;  Location:  [facility];  Service: Plastics;  Laterality: Bilateral;   LIPOMA EXCISION Right 07/29/2017   Procedure: EXCISION BACK LIPOMA;  Surgeon: Peggye Form, DO;   Location:  S[facility];  Service: Plastics;  Laterality: Right;   MAZE Left 05/07/2022   [facility] Dr.Gaca   TOTAL HIP ARTHROPLASTY Left 09/30/2018   Procedure: LEFT TOTAL HIP ARTHROPLASTY ANTERIOR APPROACH;  Surgeon: Kathryne Hitch, MD;  Location: [facility];  Service: Orthopedics;  Laterality: Left;   Social History   Occupational History   Occupation: retired  Tobacco Use   Smoking status: Former    Packs/day: 1.00    Years: 30.00    Additional pack years: 0.00    Total pack years: 30.00    Types: Cigarettes    Quit date: 07/15/1999    Years since quitting: 23.5   Smokeless tobacco: Never  Vaping Use   Vaping Use: Never used  Substance and Sexual Activity   Alcohol use: Yes    Alcohol/week: 7.0 standard drinks of alcohol    Types: 5 Glasses of wine, 2 Shots of liquor per week    Comment: social, .5 per day   Drug use: No   Sexual activity: Not on file

## 2023-01-28 NOTE — Progress Notes (Signed)
Doing good; states his hip pain is much better States these treatments along with HEP are helping

## 2023-01-29 NOTE — Telephone Encounter (Signed)
Just touching base on this clearance. Thanks for your time.

## 2023-02-01 NOTE — Telephone Encounter (Signed)
   Patient Name: Keith Hernandez  DOB: 14-Dec-1950 MRN: [id number]  Primary Cardiologist: Reatha Harps, MD  Chart reviewed as part of pre-operative protocol coverage. Given past medical history and time since last visit, based on ACC/AHA guidelines, Keith Hernandez is at acceptable risk for the planned procedure without further cardiovascular testing.  He was last seen in the office on 01/13/2023 by Dr. Flora Lipps and was doing well.  Per office protocol, patient can hold Eliquis for 2 days prior to procedure as requested. Please resume Eliquis as soon as possible postprocedure, at the discretion of the surgeon.     I will route this recommendation to the requesting party via Epic fax function and remove from pre-op pool.  Please call with questions.  Joylene Grapes, NP 02/01/2023, 3:29 PM

## 2023-02-02 NOTE — Telephone Encounter (Signed)
Keith Hernandez and his wife Keith Hernandez both informed about holding his Eliquis for 2 days prior to his procedure. He verbalized understanding.

## 2023-02-04 ENCOUNTER — Encounter: Payer: Self-pay | Admitting: Sports Medicine

## 2023-02-04 ENCOUNTER — Ambulatory Visit: Payer: Medicare Other | Admitting: Sports Medicine

## 2023-02-04 DIAGNOSIS — Z96642 Presence of left artificial hip joint: Secondary | ICD-10-CM | POA: Diagnosis not present

## 2023-02-04 DIAGNOSIS — S76819A Strain of other specified muscles, fascia and tendons at thigh level, unspecified thigh, initial encounter: Secondary | ICD-10-CM | POA: Diagnosis not present

## 2023-02-04 NOTE — Progress Notes (Signed)
Doing ok; states it seems to be improving  Did aggravate it it a few days ago, but has been taking it easy  Also has given the exercises a break for a bit as it was not bothering him as much

## 2023-02-04 NOTE — Progress Notes (Signed)
Keith Hernandez - 72 y.o. male MRN [id number]  Date of birth: 09-Dec-1950  Office Visit Note: Visit Date: 02/04/2023 PCP: Alysia Penna, MD Referred by: Alysia Penna, MD  Subjective: Chief Complaint  Patient presents with   Left Hip - Follow-up   HPI: Keith Hernandez is a pleasant 72 y.o. male who presents today for follow-up of left hip pain.  Overall feels at least 50% improved.  30% more since he has been doing his home therapy.  Has much improved with hip flexion and no pain with walking.  Still has some pain when the flexes and rotates the hip.  Took a few days break from the exercises and the nitroglycerin patch was intact mild exacerbation when he was crawling out of the window.  Has had 5 treatment sessions of ECSWT Started Nitroglycerin patch on 12/22/22.  Pertinent ROS were reviewed with the patient and found to be negative unless otherwise specified above in HPI.   Assessment & Plan: Visit Diagnoses:  1. Strain of rectus femoris muscle   2. History of left hip replacement    Plan: Keith Hernandez has certainly made progress with the extracorporeal shockwave treatment therapy as well as his home hip exercises.  He continues on the nitroglycerin patch (0.2 mg/hr) protocol as well. He will continue this for the 10-12-week mark total.  He is going on a trip with his wife, he will return in about 3 weeks for reevaluation.  At that point we may consider additional shockwave treatments.  Also discussed the role of a one-time injection into the scar tissue from the overlying rectus femoris tendon sheath, we will hold on this for now.  He will continue his home hip strengthening and stabilization exercises.  Follow-up: Return in about 3 weeks (around 02/25/2023) for for left hip.   Meds & Orders: No orders of the defined types were placed in this encounter.  No orders of the defined types were placed in this encounter.    Procedures: Procedure: ECSWT Indications:  Rectus femoris  tendinopathy   Procedure Details Consent: Risks of procedure as well as the alternatives and risks of each were explained to the patient.  Verbal consent for procedure obtained. Time Out: Verified patient identification, verified procedure, site was marked, verified correct patient position. The area was cleaned with alcohol swab.     The Left rectus femoris tendon and anterolateral hip was targeted for Extracorporeal shockwave therapy.    Preset: Status post muscular injury Power Level: 120 mJ  Frequency: 14 Hz Impulse/cycles: 3600   Head size: Regular   Patient tolerated procedure well without immediate complications.       Clinical History: MRI Left Hip IMPRESSION: 1. Status post total left hip arthroplasty. Within the limitation of metallic susceptibility artifact, no large left hip joint effusion, osteolysis or pseudotumor is identified. 2. Moderate right femoroacetabular osteoarthritis, greatest at the anterior superior aspect.     Electronically Signed   By: Neita Garnet M.D.   On: 12/23/2021 12:39  He reports that he quit smoking about 23 years ago. His smoking use included cigarettes. He has a 30.00 pack-year smoking history. He has never used smokeless tobacco. No results for input(s): "HGBA1C", "LABURIC" in the last 8760 hours.  Objective:   Vital Signs: There were no vitals taken for this visit.  Physical Exam  Gen: Well-appearing, in no acute distress; non-toxic CV: Well-perfused. Warm.  Resp: Breathing unlabored on room air; no wheezing. Psych: Fluid speech in conversation; appropriate affect; normal  thought process Neuro: Sensation intact throughout. No gross coordination deficits.   Ortho Exam - Left hip: Well-healed incision from prior THA.  There is mild TTP palpating over the rectus femoris tendon just lateral to his scar.  Improved strength and range of motion with hip flexion.  Imaging: No results found.  Past Medical/Family/Surgical/Social  History: Medications & Allergies reviewed per EMR, new medications updated. Patient Active Problem List   Diagnosis Date Noted   Muscle spasms of neck 05/09/2022   Anxiety 05/06/2022   Irregular heart beat 04/03/2022   Atrial tachycardia 04/03/2022   Atherosclerosis of coronary artery 04/03/2022   Foreign body in respiratory tract 02/05/2020   Status post total replacement of left hip 09/30/2018   Unilateral primary osteoarthritis, left hip 07/27/2018   Lipoma 03/23/2017   Dysfunction of right eustachian tube 11/26/2015   Seasonal allergic rhinitis 11/26/2015   Changing skin lesion 09/20/2015   Migraine, unspecified, not intractable, without status migrainosus 04/29/2015   Hyperlipidemia 04/29/2015   Gastro-esophageal reflux disease without esophagitis 04/29/2015   Essential hypertension 04/29/2015   Contracture of palmar fascia 04/29/2015   Abdominal aortic aneurysm without rupture (HCC) 04/29/2015   Past Medical History:  Diagnosis Date   AAA (abdominal aortic aneurysm) (HCC)    Anxiety    Arthritis    Bulging of cervical intervertebral disc    C6-7   Dupuytren's disease    GERD (gastroesophageal reflux disease)    OTC meds pnr   History of colon polyps    Hyperlipidemia    Hypertension    Migraines    Pneumonia    Sleep apnea    Family History  Problem Relation Age of Onset   Arthritis Mother    Heart disease Mother    Heart attack Mother    Asthma Father    Asthma Paternal Grandfather    Past Surgical History:  Procedure Laterality Date   achilles reattachment     FASCIOTOMY Left 10/11/2015   Procedure: LEFT HAND SUBTOTAL PALMAR FASCIOTOMY WITH REPAIR RECONSTRUCTION ;  Surgeon: Dominica Severin, MD;  Location: [facility];  Service: Orthopedics;  Laterality: Left;   Left Atrial Mass Excision  Left 05/07/2022   [facility] Dr. Zebedee Iba, with cardiac ablation   LESION DESTRUCTION Bilateral 07/29/2017   Procedure: EXCISION LIP LESION;  Surgeon: Peggye Form, DO;  Location: [facility];  Service: Plastics;  Laterality: Bilateral;   LIPOMA EXCISION Right 07/29/2017   Procedure: EXCISION BACK LIPOMA;  Surgeon: Peggye Form, DO;  Location: [facility];  Service: Plastics;  Laterality: Right;   MAZE Left 05/07/2022   [facility] Dr.Gaca   TOTAL HIP ARTHROPLASTY Left 09/30/2018   Procedure: LEFT TOTAL HIP ARTHROPLASTY ANTERIOR APPROACH;  Surgeon: Kathryne Hitch, MD;  Location: [facility];  Service: Orthopedics;  Laterality: Left;   Social History   Occupational History   Occupation: retired  Tobacco Use   Smoking status: Former    Packs/day: 1.00    Years: 30.00    Additional pack years: 0.00    Total pack years: 30.00    Types: Cigarettes    Quit date: 07/15/1999    Years since quitting: 23.5   Smokeless tobacco: Never  Vaping Use   Vaping Use: Never used  Substance and Sexual Activity   Alcohol use: Yes    Alcohol/week: 7.0 standard drinks of alcohol    Types: 5 Glasses of wine, 2 Shots of liquor per week    Comment: social, .5 per  day   Drug use: No   Sexual activity: Not on file

## 2023-02-22 ENCOUNTER — Encounter: Payer: Self-pay | Admitting: Sports Medicine

## 2023-02-22 ENCOUNTER — Ambulatory Visit: Payer: Medicare Other | Admitting: Sports Medicine

## 2023-02-22 DIAGNOSIS — Z96642 Presence of left artificial hip joint: Secondary | ICD-10-CM | POA: Diagnosis not present

## 2023-02-22 DIAGNOSIS — S76819A Strain of other specified muscles, fascia and tendons at thigh level, unspecified thigh, initial encounter: Secondary | ICD-10-CM | POA: Diagnosis not present

## 2023-02-22 NOTE — Progress Notes (Signed)
CHETT Hernandez - 72 y.o. male MRN [id number]  Date of birth: Nov 25, 1950  Office Visit Note: Visit Date: 02/22/2023 PCP: Alysia Penna, MD Referred by: Alysia Penna, MD  Subjective: Chief Complaint  Patient presents with   Left Hip - Follow-up   HPI: Keith Hernandez is a pleasant 72 y.o. male who presents today for follow-up of left anterior hip pain.  Has had several treatments of ECSWT - tolerating well.  Still using nitroglycerin patch and doing his home exercises, but did take a break for both of these as he went on a trip to [place].  Had a very nice time, the hip only bothered him 1 time.  Overall continues to make improvements.  Pertinent ROS were reviewed with the patient and found to be negative unless otherwise specified above in HPI.   Assessment & Plan: Visit Diagnoses:  1. Strain of rectus femoris muscle   2. History of left hip replacement    Plan: Bexton continues to make progress in both pain reduction in strength from extracorporeal shockwave therapy as well as his home hip exercises.  We did take a brief holiday but did repeat treatment today.  We did review his calorie today we will proceed with another 1-2 treatments and in the interim if he is not making good progress may consider a one-time injection into the scar tissue overlying the rectus femoris tendon sheath, we will hold on this for now.  He will continue his home hip stabilization and strengthening exercises as well as the nitroglycerin patch 0.2 mg/h changing once daily. F/u in 1 week.  Follow-up: Return in about 1 week (around 03/01/2023) for for left hip (reg visit).   Meds & Orders: No orders of the defined types were placed in this encounter.  No orders of the defined types were placed in this encounter.    Procedures: Procedure: ECSWT Indications:  Rectus femoris tendinopathy   Procedure Details Consent: Risks of procedure as well as the alternatives and risks of each were explained to the  patient.  Verbal consent for procedure obtained. Time Out: Verified patient identification, verified procedure, site was marked, verified correct patient position. The area was cleaned with alcohol swab.     The Left rectus femoris tendon and anterolateral hip was targeted for Extracorporeal shockwave therapy.    Preset: Status post muscular injury Power Level: 120 mJ  Frequency: 15 Hz Impulse/cycles: 3500   Head size: Regular   Patient tolerated procedure well without immediate complications.      Clinical History: MRI Left Hip IMPRESSION: 1. Status post total left hip arthroplasty. Within the limitation of metallic susceptibility artifact, no large left hip joint effusion, osteolysis or pseudotumor is identified. 2. Moderate right femoroacetabular osteoarthritis, greatest at the anterior superior aspect.     Electronically Signed   By: Neita Garnet M.D.   On: 12/23/2021 12:39  He reports that he quit smoking about 23 years ago. His smoking use included cigarettes. He has a 30.00 pack-year smoking history. He has never used smokeless tobacco. No results for input(s): "HGBA1C", "LABURIC" in the last 8760 hours.  Objective:   Vital Signs: There were no vitals taken for this visit.  Physical Exam  Gen: Well-appearing, in no acute distress; non-toxic CV: Regular Rate. Well-perfused. Warm.  Resp: Breathing unlabored on room air; no wheezing. Psych: Fluid speech in conversation; appropriate affect; normal thought process Neuro: Sensation intact throughout. No gross coordination deficits.   Ortho Exam - Left hip: Well-healed incision  from prior THA.  There is mild TTP palpating over the rectus femoris tendon just lateral to his scar.  Improved strength and range of motion with hip flexion.   Imaging: No results found.  Past Medical/Family/Surgical/Social History: Medications & Allergies reviewed per EMR, new medications updated. Patient Active Problem List   Diagnosis Date  Noted   Muscle spasms of neck 05/09/2022   Anxiety 05/06/2022   Irregular heart beat 04/03/2022   Atrial tachycardia 04/03/2022   Atherosclerosis of coronary artery 04/03/2022   Foreign body in respiratory tract 02/05/2020   Status post total replacement of left hip 09/30/2018   Unilateral primary osteoarthritis, left hip 07/27/2018   Lipoma 03/23/2017   Dysfunction of right eustachian tube 11/26/2015   Seasonal allergic rhinitis 11/26/2015   Changing skin lesion 09/20/2015   Migraine, unspecified, not intractable, without status migrainosus 04/29/2015   Hyperlipidemia 04/29/2015   Gastro-esophageal reflux disease without esophagitis 04/29/2015   Essential hypertension 04/29/2015   Contracture of palmar fascia 04/29/2015   Abdominal aortic aneurysm without rupture (HCC) 04/29/2015   Past Medical History:  Diagnosis Date   AAA (abdominal aortic aneurysm) (HCC)    Anxiety    Arthritis    Bulging of cervical intervertebral disc    C6-7   Dupuytren's disease    GERD (gastroesophageal reflux disease)    OTC meds pnr   History of colon polyps    Hyperlipidemia    Hypertension    Migraines    Pneumonia    Sleep apnea    Family History  Problem Relation Age of Onset   Arthritis Mother    Heart disease Mother    Heart attack Mother    Asthma Father    Asthma Paternal Grandfather    Past Surgical History:  Procedure Laterality Date   achilles reattachment     FASCIOTOMY Left 10/11/2015   Procedure: LEFT HAND SUBTOTAL PALMAR FASCIOTOMY WITH REPAIR RECONSTRUCTION ;  Surgeon: Dominica Severin, MD;  Location: [facility];  Service: Orthopedics;  Laterality: Left;   Left Atrial Mass Excision  Left 05/07/2022   [facility] Dr. Zebedee Iba, with cardiac ablation   LESION DESTRUCTION Bilateral 07/29/2017   Procedure: EXCISION LIP LESION;  Surgeon: Peggye Form, DO;  Location: [facility];  Service: Plastics;  Laterality: Bilateral;   LIPOMA EXCISION Right  07/29/2017   Procedure: EXCISION BACK LIPOMA;  Surgeon: Peggye Form, DO;  Location: [facility];  Service: Plastics;  Laterality: Right;   MAZE Left 05/07/2022   [facility] Dr.Gaca   TOTAL HIP ARTHROPLASTY Left 09/30/2018   Procedure: LEFT TOTAL HIP ARTHROPLASTY ANTERIOR APPROACH;  Surgeon: Kathryne Hitch, MD;  Location: [facility];  Service: Orthopedics;  Laterality: Left;   Social History   Occupational History   Occupation: retired  Tobacco Use   Smoking status: Former    Packs/day: 1.00    Years: 30.00    Additional pack years: 0.00    Total pack years: 30.00    Types: Cigarettes    Quit date: 07/15/1999    Years since quitting: 23.6   Smokeless tobacco: Never  Vaping Use   Vaping Use: Never used  Substance and Sexual Activity   Alcohol use: Yes    Alcohol/week: 7.0 standard drinks of alcohol    Types: 5 Glasses of wine, 2 Shots of liquor per week    Comment: social, .5 per day   Drug use: No   Sexual activity: Not on file

## 2023-02-22 NOTE — Progress Notes (Signed)
50% better from where he started Stated he did a lot of walking during cruise so he thinks he aggravated it to some degree  Unable to do exercises on cruise, but did a lot of walking

## 2023-02-23 ENCOUNTER — Other Ambulatory Visit: Payer: Self-pay | Admitting: Cardiovascular Disease

## 2023-02-25 ENCOUNTER — Ambulatory Visit (AMBULATORY_SURGERY_CENTER): Payer: Medicare Other

## 2023-02-25 ENCOUNTER — Encounter: Payer: Self-pay | Admitting: Internal Medicine

## 2023-02-25 VITALS — Ht 72.0 in | Wt 196.0 lb

## 2023-02-25 DIAGNOSIS — Z1211 Encounter for screening for malignant neoplasm of colon: Secondary | ICD-10-CM

## 2023-02-25 NOTE — Progress Notes (Signed)
No egg or soy allergy known to patient  No issues known to pt with past sedation with any surgeries or procedures Patient denies ever being told they had issues or difficulty with intubation  No FH of Malignant Hyperthermia Pt is not on diet pills Pt is not on  home 02  Pt is not on blood thinners  Pt denies issues with constipation  A fib corrected by surgery No A flutter Have any cardiac testing pending--no Pt instructed to use Single[employer] or GoodRx for a price reduction on prep  Patient's chart reviewed by Cathlyn Parsons CNRA prior to previsit and patient appropriate for the [facility].  Previsit completed and red dot placed by patient's name on their procedure day (on provider's schedule).   Can ambulate without assistance

## 2023-03-02 ENCOUNTER — Encounter: Payer: Self-pay | Admitting: Sports Medicine

## 2023-03-02 ENCOUNTER — Ambulatory Visit: Payer: Medicare Other | Admitting: Sports Medicine

## 2023-03-02 DIAGNOSIS — Z96642 Presence of left artificial hip joint: Secondary | ICD-10-CM

## 2023-03-02 DIAGNOSIS — S76819A Strain of other specified muscles, fascia and tendons at thigh level, unspecified thigh, initial encounter: Secondary | ICD-10-CM

## 2023-03-02 DIAGNOSIS — K080 Exfoliation of teeth due to systemic causes: Secondary | ICD-10-CM | POA: Diagnosis not present

## 2023-03-02 NOTE — Progress Notes (Signed)
[name] - 72 y.o. male MRN [id number]  Date of birth: 1951-07-22  Office Visit Note: Visit Date: 03/02/2023 PCP: Alysia Penna, MD Referred by: Alysia Penna, MD  Subjective: Chief Complaint  Patient presents with   Left Hip - Follow-up   HPI: Keith Hernandez is a pleasant 72 y.o. male who presents today for follow-up of left anterior hip pain.  Honest has had several treatments of extracorporeal shockwave treatment therapy, overall continues to find improvement but not 100%.  Feels he is at least 60% or more.  Is using the nitroglycerin patch, changing once daily.  Pain with flexion-based exercises.  He continues with his home rehab on a consistent basis.  Pertinent ROS were reviewed with the patient and found to be negative unless otherwise specified above in HPI.   Assessment & Plan: Visit Diagnoses:  1. Strain of rectus femoris muscle   2. History of left hip replacement    Plan: Khalif is certainly making progress with the hip with the extracorporeal shockwave therapy and his home hip exercises, we did repeat ESWT today.  He will follow-up next week for an additional treatment.  We discussed after that visit if he feels like his progress has plateaued we would likely consider an ultrasound-guided rectus femoris tendon sheath injection +/- around scar tissue.  In the meantime, he will continue his nitroglycerin patch 0.2 mg/h, changing once daily and his home hip stabilization exercises.  Follow-up in 1 week.  Follow-up: Return in about 1 week (around 03/09/2023) for for L-hip (reg visit).   Meds & Orders: No orders of the defined types were placed in this encounter.  No orders of the defined types were placed in this encounter.    Procedures: Procedure: ECSWT Indications:  Rectus femoris tendinopathy   Procedure Details Consent: Risks of procedure as well as the alternatives and risks of each were explained to the patient.  Verbal consent for procedure obtained. Time  Out: Verified patient identification, verified procedure, site was marked, verified correct patient position. The area was cleaned with alcohol swab.     The Left rectus femoris tendon and anterolateral hip was targeted for Extracorporeal shockwave therapy.    Preset: Status post muscular injury Power Level: 120 mJ  Frequency: 14-15 Hz Impulse/cycles: 3500   Head size: Regular   Patient tolerated procedure well without immediate complications.      Clinical History: MRI Left Hip IMPRESSION: 1. Status post total left hip arthroplasty. Within the limitation of metallic susceptibility artifact, no large left hip joint effusion, osteolysis or pseudotumor is identified. 2. Moderate right femoroacetabular osteoarthritis, greatest at the anterior superior aspect.     Electronically Signed   By: Neita Garnet M.D.   On: 12/23/2021 12:39  He reports that he quit smoking about 23 years ago. His smoking use included cigarettes. He has a 30.00 pack-year smoking history. He has never used smokeless tobacco. No results for input(s): "HGBA1C", "LABURIC" in the last 8760 hours.  Objective:   Vital Signs: There were no vitals taken for this visit.  Physical Exam  Gen: Well-appearing, in no acute distress; non-toxic CV:  Well-perfused. Warm.  Resp: Breathing unlabored on room air; no wheezing. Psych: Fluid speech in conversation; appropriate affect; normal thought process Neuro: Sensation intact throughout. No gross coordination deficits.   Ortho Exam - Left hip: Well-healed incision from prior THA, there is very mild TTP palpating deep to the rectus femoris tendon today just medial to his hip scar.  Good  range of motion, mild pain with Stinchfield and resisted hip flexion.  Imaging: No results found.  Past Medical/Family/Surgical/Social History: Medications & Allergies reviewed per EMR, new medications updated. 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   Irregular heart beat 04/03/2022   Atrial fibrillation (HCC) 04/03/2022   Atherosclerosis of coronary artery 04/03/2022   Foreign body in respiratory tract 02/05/2020   Status post total replacement of left hip 09/30/2018   Unilateral primary osteoarthritis, left hip 07/27/2018   Lipoma 03/23/2017   Dysfunction of right eustachian tube 11/26/2015   Seasonal allergic rhinitis 11/26/2015   Changing skin lesion 09/20/2015   Migraine, unspecified, not intractable, without status migrainosus 04/29/2015   Hyperlipidemia 04/29/2015   Gastro-esophageal reflux disease without esophagitis 04/29/2015   Essential hypertension 04/29/2015   Contracture of palmar fascia 04/29/2015   Abdominal aortic aneurysm without rupture (HCC) 04/29/2015   Past Medical History:  Diagnosis Date   AAA (abdominal aortic aneurysm) (HCC)    Anxiety    Arthritis    Bulging of cervical intervertebral disc    C6-7   Dupuytren's disease    GERD (gastroesophageal reflux disease)    OTC meds pnr   History of colon polyps    Hyperlipidemia    Hypertension    Migraines    Pneumonia    Sleep apnea    Family History  Problem Relation Age of Onset   Arthritis Mother    Heart disease Mother    Heart attack Mother    Asthma Father    Asthma Paternal Grandfather    Colon cancer Neg Hx    Colon polyps Neg Hx    Esophageal cancer Neg Hx    Rectal cancer Neg Hx    Stomach cancer Neg Hx    Past Surgical History:  Procedure Laterality Date   achilles reattachment     FASCIOTOMY Left 10/11/2015   Procedure: LEFT HAND SUBTOTAL PALMAR FASCIOTOMY WITH REPAIR RECONSTRUCTION ;  Surgeon: Dominica Severin, MD;  Location: [facility];  Service: Orthopedics;  Laterality: Left;   Left Atrial Mass Excision  Left 05/07/2022   [facility] Dr. Zebedee Iba, with cardiac ablation    LESION DESTRUCTION Bilateral 07/29/2017   Procedure: EXCISION LIP LESION;  Surgeon: Peggye Form, DO;  Location: [facility];  Service: Plastics;  Laterality: Bilateral;   LIPOMA EXCISION Right 07/29/2017   Procedure: EXCISION BACK LIPOMA;  Surgeon: Peggye Form, DO;  [facility];  Service: Plastics;  Laterality: Right;   MAZE Left 05/07/2022   [facility] Dr.Gaca   TOTAL HIP ARTHROPLASTY Left 09/30/2018   Procedure: LEFT TOTAL HIP ARTHROPLASTY ANTERIOR APPROACH;  Surgeon: Kathryne Hitch, MD;  Location: [facility];  Service: Orthopedics;  Laterality: Left;   Social History   Occupational History   Occupation: retired  Tobacco Use   Smoking status: Former    Packs/day: 1.00    Years: 30.00    Additional pack years: 0.00    Total pack years: 30.00    Types: Cigarettes    Quit date: 07/15/1999    Years since quitting: 23.6   Smokeless tobacco: Never  Vaping Use   Vaping Use: Never used  Substance and Sexual Activity   Alcohol use: Yes    Alcohol/week: 7.0 standard drinks of alcohol  Types: 5 Glasses of wine, 2 Shots of liquor per week    Comment: social, .5 per day   Drug use: No   Sexual activity: Not on file

## 2023-03-03 DIAGNOSIS — K080 Exfoliation of teeth due to systemic causes: Secondary | ICD-10-CM | POA: Diagnosis not present

## 2023-03-04 ENCOUNTER — Encounter: Payer: Self-pay | Admitting: Internal Medicine

## 2023-03-04 ENCOUNTER — Ambulatory Visit (AMBULATORY_SURGERY_CENTER): Payer: Medicare Other | Admitting: Internal Medicine

## 2023-03-04 VITALS — BP 129/74 | HR 67 | Temp 97.1°F | Resp 18 | Ht 72.0 in | Wt 196.0 lb

## 2023-03-04 DIAGNOSIS — Z1211 Encounter for screening for malignant neoplasm of colon: Secondary | ICD-10-CM | POA: Diagnosis not present

## 2023-03-04 DIAGNOSIS — D123 Benign neoplasm of transverse colon: Secondary | ICD-10-CM | POA: Diagnosis not present

## 2023-03-04 MED ORDER — SODIUM CHLORIDE 0.9 % IV SOLN
500.0000 mL | Freq: Once | INTRAVENOUS | Status: DC
Start: 2023-03-04 — End: 2023-03-04

## 2023-03-04 NOTE — Op Note (Signed)
[facility] Patient Name: Keith Hernandez Procedure Date: 03/04/2023 8:32 AM MRN: [id number] Endoscopist: Iva Boop , MD, [id number] Age: 72 Referring MD:  Date of Birth: September 23, 1950 Gender: Male Account #: [id number] Procedure:                Colonoscopy Indications:              Screening for colorectal malignant neoplasm Medicines:                Monitored Anesthesia Care Procedure:                Pre-Anesthesia Assessment:                           - Prior to the procedure, a History and Physical                            was performed, and patient medications and                            allergies were reviewed. The patient's tolerance of                            previous anesthesia was also reviewed. The risks                            and benefits of the procedure and the sedation                            options and risks were discussed with the patient.                            All questions were answered, and informed consent                            was obtained. Prior Anticoagulants: The patient                            last took Eliquis (apixaban) 2 days prior to the                            procedure. ASA Grade Assessment: III - A patient                            with severe systemic disease. After reviewing the                            risks and benefits, the patient was deemed in                            satisfactory condition to undergo the procedure.                           After obtaining informed consent, the colonoscope  was passed under direct vision. Throughout the                            procedure, the patient's blood pressure, pulse, and                            oxygen saturations were monitored continuously. The                            Olympus CF-HQ190L SN [id number] was introduced through                            the anus and advanced to the the cecum, identified                            by  appendiceal orifice and ileocecal valve. The                            colonoscopy was performed without difficulty. The                            patient tolerated the procedure well. The quality                            of the bowel preparation was good. The bowel                            preparation used was Miralax via split dose                            instruction. The ileocecal valve, appendiceal                            orifice, and rectum were photographed but there was                            a malfunction and no images obtained. Scope In: 8:46:08 AM Scope Out: 8:57:36 AM Scope Withdrawal Time: 0 hours 8 minutes 50 seconds  Total Procedure Duration: 0 hours 11 minutes 28 seconds  Findings:                 The perianal and digital rectal examinations were                            normal. Pertinent negatives include normal prostate                            (size, shape, and consistency).                           A diminutive polyp was found in the transverse                            colon. The polyp was sessile. The polyp was  removed                            with a cold snare. Resection and retrieval were                            complete. Verification of patient identification                            for the specimen was done. Estimated blood loss was                            minimal.                           Multiple diverticula were found in the sigmoid                            colon.                           The exam was otherwise without abnormality on                            direct and retroflexion views. Complications:            No immediate complications. Estimated Blood Loss:     Estimated blood loss was minimal. Impression:               - One diminutive polyp in the transverse colon,                            removed with a cold snare. Resected and retrieved.                           - Diverticulosis in the sigmoid colon.                            - The examination was otherwise normal on direct                            and retroflexion views. Recommendation:           - Patient has a contact number available for                            emergencies. The signs and symptoms of potential                            delayed complications were discussed with the                            patient. Return to normal activities tomorrow.                            Written discharge instructions were provided to the  patient.                           - Resume previous diet.                           - Continue present medications.                           - Resume Eliquis (apixaban) at prior dose tomorrow.                           - Await pathology results.                           - No recommendation at this time regarding repeat                            colonoscopy due to age. Iva Boop, MD 03/04/2023 9:07:24 AM This report has been signed electronically.

## 2023-03-04 NOTE — Progress Notes (Signed)
[facility] History and Physical   Primary Care Physician:  Alysia Penna, MD   Reason for Procedure:   CRCA screening  Plan:    colonoscopy     HPI: Keith Hernandez is a 72 y.o. male for screening exam   Past Medical History:  Diagnosis Date   AAA (abdominal aortic aneurysm) (HCC)    Anxiety    Arthritis    Bulging of cervical intervertebral disc    C6-7   Dupuytren's disease    GERD (gastroesophageal reflux disease)    OTC meds pnr   History of colon polyps    Hyperlipidemia    Hypertension    Migraines    Pneumonia    Sleep apnea     Past Surgical History:  Procedure Laterality Date   achilles reattachment     FASCIOTOMY Left 10/11/2015   Procedure: LEFT HAND SUBTOTAL PALMAR FASCIOTOMY WITH REPAIR RECONSTRUCTION ;  Surgeon: Dominica Severin, MD;  Location: [facility];  Service: Orthopedics;  Laterality: Left;   Left Atrial Mass Excision  Left 05/07/2022   [facility] Dr. Zebedee Iba, with cardiac ablation   LESION DESTRUCTION Bilateral 07/29/2017   Procedure: EXCISION LIP LESION;  Surgeon: Peggye Form, DO;  Location: [facility];  Service: Plastics;  Laterality: Bilateral;   LIPOMA EXCISION Right 07/29/2017   Procedure: EXCISION BACK LIPOMA;  Surgeon: Peggye Form, DO;  Location: [facility];  Service: Plastics;  Laterality: Right;   MAZE Left 05/07/2022   [facility] Dr.Gaca   TOTAL HIP ARTHROPLASTY Left 09/30/2018   Procedure: LEFT TOTAL HIP ARTHROPLASTY ANTERIOR APPROACH;  Surgeon: Kathryne Hitch, MD;  Location: [facility];  Service: Orthopedics;  Laterality: Left;    Prior to Admission medications   Medication Sig Start Date End Date Taking? Authorizing Provider  Cholecalciferol (VITAMIN D3 PO) Take 1 capsule by mouth daily.   Yes [provider]  flecainide (TAMBOCOR) 50 MG tablet TAKE 1 TABLET BY MOUTH TWICE DAILY 02/23/23  Yes O'Neal, Ronnald Ramp, MD  LORazepam (ATIVAN) 0.5 MG tablet Take  0.5 mg by mouth 2 (two) times daily as needed. 04/27/22  Yes [provider]  metoprolol succinate (TOPROL-XL) 50 MG 24 hr tablet Take 1 tablet (50 mg total) by mouth daily. Take with or immediately following a meal. 01/13/23  Yes O'Neal, Ronnald Ramp, MD  Multiple Vitamin (MULTIVITAMIN) tablet Take 1 tablet by mouth daily.   Yes [provider]  nitroGLYCERIN (NITRODUR - DOSED IN MG/24 HR) 0.2 mg/hr patch Cut patch into fourths. Place 1/4 patch over affected area, change every 24 hours. 12/22/22  Yes Madelyn Brunner, DO  rosuvastatin (CRESTOR) 5 MG tablet TAKE 1 TABLET (5 MG TOTAL) BY MOUTH DAILY. 09/02/22  Yes O'Neal, Ronnald Ramp, MD  triamcinolone (NASACORT) 55 MCG/ACT AERO nasal inhaler Place 1 spray into the nose daily as needed (for congestion).   Yes [provider]  amiodarone (PACERONE) 200 MG tablet Take 400 mg by mouth 2 (two) times daily. Patient not taking: Reported on 02/25/2023 01/26/23   [provider]  apixaban (ELIQUIS) 5 MG TABS tablet Take 1 tablet (5 mg total) by mouth 2 (two) times daily. 06/12/22   Jodelle Gross, NP  furosemide (LASIX) 40 MG tablet Take 40 mg by mouth daily. Patient not taking: Reported on 02/25/2023 01/26/23   [provider]    Current Outpatient Medications  Medication Sig Dispense Refill   Cholecalciferol (VITAMIN D3 PO) Take 1 capsule by mouth daily.  flecainide (TAMBOCOR) 50 MG tablet TAKE 1 TABLET BY MOUTH TWICE DAILY 60 tablet 11   LORazepam (ATIVAN) 0.5 MG tablet Take 0.5 mg by mouth 2 (two) times daily as needed.     metoprolol succinate (TOPROL-XL) 50 MG 24 hr tablet Take 1 tablet (50 mg total) by mouth daily. Take with or immediately following a meal. 90 tablet 3   Multiple Vitamin (MULTIVITAMIN) tablet Take 1 tablet by mouth daily.     nitroGLYCERIN (NITRODUR - DOSED IN MG/24 HR) 0.2 mg/hr patch Cut patch into fourths. Place 1/4 patch over affected area, change every 24 hours. 30 patch 1    rosuvastatin (CRESTOR) 5 MG tablet TAKE 1 TABLET (5 MG TOTAL) BY MOUTH DAILY. 90 tablet 3   triamcinolone (NASACORT) 55 MCG/ACT AERO nasal inhaler Place 1 spray into the nose daily as needed (for congestion).     amiodarone (PACERONE) 200 MG tablet Take 400 mg by mouth 2 (two) times daily. (Patient not taking: Reported on 02/25/2023)     apixaban (ELIQUIS) 5 MG TABS tablet Take 1 tablet (5 mg total) by mouth 2 (two) times daily. 180 tablet 3   furosemide (LASIX) 40 MG tablet Take 40 mg by mouth daily. (Patient not taking: Reported on 02/25/2023)     Current Facility-Administered Medications  Medication Dose Route Frequency Provider Last Rate Last Admin   0.9 %  sodium chloride infusion  500 mL Intravenous Once Iva Boop, MD        Allergies as of 03/04/2023 - Review Complete 03/04/2023  Allergen Reaction Noted   Pollen extract  04/13/2022   Atorvastatin  04/03/2022    Family History  Problem Relation Age of Onset   Arthritis Mother    Heart disease Mother    Heart attack Mother    Asthma Father    Asthma Paternal Grandfather    Colon cancer Neg Hx    Colon polyps Neg Hx    Esophageal cancer Neg Hx    Rectal cancer Neg Hx    Stomach cancer Neg Hx     Social History   Socioeconomic History   Marital status: Married    Spouse name: Not on file   Number of children: 0   Years of education: Not on file   Highest education level: Not on file  Occupational History   Occupation: retired  Tobacco Use   Smoking status: Former    Packs/day: 1.00    Years: 30.00    Additional pack years: 0.00    Total pack years: 30.00    Types: Cigarettes    Quit date: 07/15/1999    Years since quitting: 23.6   Smokeless tobacco: Never  Vaping Use   Vaping Use: Never used  Substance and Sexual Activity   Alcohol use: Yes    Alcohol/week: 7.0 standard drinks of alcohol    Types: 5 Glasses of wine, 2 Shots of liquor per week    Comment: social, .5 per day   Drug use: No   Sexual  activity: Not on file  Other Topics Concern   Not on file  Social History Narrative   Married and retired no children.  Former smoker one half an alcoholic beverage daily 1 caffeinated beverage daily no drug use no tobacco currently.   Social Determinants of Health   Financial Resource Strain: Not on file  Food Insecurity: Not on file  Transportation Needs: Not on file  Physical Activity: Not on file  Stress: Not on file  Social  Connections: Not on file  Intimate Partner Violence: Not on file    Review of Systems:  All other review of systems negative except as mentioned in the HPI.  Physical Exam: Vital signs BP 139/88   Pulse 68   Temp (!) 97.1 F (36.2 C)   Ht 6' (1.829 m)   Wt 196 lb (88.9 kg)   SpO2 97%   BMI 26.58 kg/m   General:   Alert,  Well-developed, well-nourished, pleasant and cooperative in NAD Lungs:  Clear throughout to auscultation.   Heart:  Regular rate and rhythm; no murmurs, clicks, rubs,  or gallops. Abdomen:  Soft, nontender and nondistended. Normal bowel sounds.   Neuro/Psych:  Alert and cooperative. Normal mood and affect. A and O x 3   @Sorina Derrig  Sena Slate, MD, [facility] [phone] (pager) 03/04/2023 8:32 AM@

## 2023-03-04 NOTE — Patient Instructions (Addendum)
I found and removed one tiny polyp that looks benign. It will be analyzed. You probably do not need to repeat a routine colonoscopy.   You also have a condition called diverticulosis - common and not usually a problem. Please read the handout provided.  Resume Eliquis tomorrow.  I appreciate the opportunity to care for you. Iva Boop, MD, [facility]   You may resume your Eliquis tomorrow.  Resume all of your other medications today.  YOU HAD AN ENDOSCOPIC PROCEDURE TODAY AT THE [facility]:   Refer to the procedure report that was given to you for any specific questions about what was found during the examination.  If the procedure report does not answer your questions, please call your gastroenterologist to clarify.  If you requested that your care partner not be given the details of your procedure findings, then the procedure report has been included in a sealed envelope for you to review at your convenience later.  YOU SHOULD EXPECT: Some feelings of bloating in the abdomen. Passage of more gas than usual.  Walking can help get rid of the air that was put into your GI tract during the procedure and reduce the bloating. If you had a lower endoscopy (such as a colonoscopy or flexible sigmoidoscopy) you may notice spotting of blood in your stool or on the toilet paper. If you underwent a bowel prep for your procedure, you may not have a normal bowel movement for a few days.  Please Note:  You might notice some irritation and congestion in your nose or some drainage.  This is from the oxygen used during your procedure.  There is no need for concern and it should clear up in a day or so.  SYMPTOMS TO REPORT IMMEDIATELY:  Following lower endoscopy (colonoscopy or flexible sigmoidoscopy):  Excessive amounts of blood in the stool  Significant tenderness or worsening of abdominal pains  Swelling of the abdomen that is new, acute  Fever of 100F or higher   For urgent or emergent  issues, a gastroenterologist can be reached at any hour by calling [phone]. Do not use MyChart messaging for urgent concerns.    DIET:  We do recommend a small meal at first, but then you may proceed to your regular diet.  Drink plenty of fluids but you should avoid alcoholic beverages for 24 hours.  ACTIVITY:  You should plan to take it easy for the rest of today and you should NOT DRIVE or use heavy machinery until tomorrow (because of the sedation medicines used during the test).    FOLLOW UP: Our staff will call the number listed on your records the next business day following your procedure.  We will call around 7:15- 8:00 am to check on you and address any questions or concerns that you may have regarding the information given to you following your procedure. If we do not reach you, we will leave a message.     If any biopsies were taken you will be contacted by phone or by letter within the next 1-3 weeks.  Please call us at [phone] if you have not heard about the biopsies in 3 weeks.    SIGNATURES/CONFIDENTIALITY: You and/or your care partner have signed paperwork which will be entered into your electronic medical record.  These signatures attest to the fact that that the information above on your After Visit Summary has been reviewed and is understood.  Full responsibility of the confidentiality of this discharge information  lies with you and/or your care-partner.

## 2023-03-04 NOTE — Progress Notes (Signed)
To pacu, VSS. Report to [email or web address].tb 

## 2023-03-04 NOTE — Progress Notes (Signed)
Called to room to assist during endoscopic procedure.  Patient ID and intended procedure confirmed with present staff. Received instructions for my participation in the procedure from the performing physician.  

## 2023-03-05 ENCOUNTER — Telehealth: Payer: Self-pay

## 2023-03-05 NOTE — Telephone Encounter (Signed)
Post procedure follow up call, no answer 

## 2023-03-10 ENCOUNTER — Encounter: Payer: Self-pay | Admitting: Sports Medicine

## 2023-03-10 ENCOUNTER — Ambulatory Visit: Payer: Medicare Other | Admitting: Sports Medicine

## 2023-03-10 DIAGNOSIS — Z96642 Presence of left artificial hip joint: Secondary | ICD-10-CM | POA: Diagnosis not present

## 2023-03-10 DIAGNOSIS — S76812A Strain of other specified muscles, fascia and tendons at thigh level, left thigh, initial encounter: Secondary | ICD-10-CM

## 2023-03-10 DIAGNOSIS — S76819A Strain of other specified muscles, fascia and tendons at thigh level, unspecified thigh, initial encounter: Secondary | ICD-10-CM

## 2023-03-10 NOTE — Progress Notes (Signed)
States he is doing better  

## 2023-03-10 NOTE — Progress Notes (Signed)
JAYANTH SZCZESNIAK - 72 y.o. male MRN [id number]  Date of birth: [birth year]  Office Visit Note: Visit Date: 03/10/2023 PCP: Alysia Penna, MD Referred by: Alysia Penna, MD  Subjective: Chief Complaint  Patient presents with   Left Hip - Follow-up   HPI: KEVANTE LUNT is a pleasant 72 y.o. male who presents today for follow-up of left anterior hip pain.  He is status post left total hip arthroplasty by Dr. Magnus Ivan.  He has been experiencing pain with flexion muscle of the rectus femoris muscle/tendon.  He has made progress with a few sessions of extracorporeal shockwave therapy.  Also performing his home hip exercises.  At this point, feels like he is continuing to make improvements.  He last performed treatment on 03/02/2023 and did note some further improvement of his pain.  He has been active packing for a trip to the mountains and has not noticed the pain over the anterior hip.  Continues with nitroglycerin patch and his home hip stabilization exercises.  Pertinent ROS were reviewed with the patient and found to be negative unless otherwise specified above in HPI.   Assessment & Plan: Visit Diagnoses:  1. Strain of rectus femoris muscle   2. History of left hip replacement    Plan: Normon continues to make progress with extracorporeal shockwave therapy and continuing his home hip exercise treatment plan.  We did repeat ESWT today.  He is going on a trip to the mountains, we will take a break for a few weeks and then have him follow-up in about 3 weeks for reevaluation.  Discussed at that time we could always consider ultrasound-guided rectus femoris tendon sheath injection +/- around his scar tissue if his pain improvement and function does not further improve but he did get good results from last treatment.  In the meantime he will continue his nitroglycerin patch 0.2 mg/h, changing this once daily and continue his HEP.  If he continues with good improvement, after 1 or 2 more  treatments we may consider a longer  treatment holiday to see if he continues to improve after completion of ESWT.  Follow-up: F/u in 3 weeks   Meds & Orders: No orders of the defined types were placed in this encounter.  No orders of the defined types were placed in this encounter.    Procedures: Procedure: ECSWT Indications:  Rectus femoris tendinopathy   Procedure Details Consent: Risks of procedure as well as the alternatives and risks of each were explained to the patient.  Verbal consent for procedure obtained. Time Out: Verified patient identification, verified procedure, site was marked, verified correct patient position. The area was cleaned with alcohol swab.     The Left rectus femoris tendon and anterolateral hip was targeted for Extracorporeal shockwave therapy.    Preset: Status post muscular injury Power Level: 120 mJ  Frequency: 15 Hz Impulse/cycles: 3600   Head size: Regular   Patient tolerated procedure well without immediate complications      Clinical History:   He reports that he quit smoking about 23 years ago. His smoking use included cigarettes. He has a 30.00 pack-year smoking history. He has never used smokeless tobacco. No results for input(s): "HGBA1C", "LABURIC" in the last 8760 hours.  Objective:   Vital Signs: There were no vitals taken for this visit.  Physical Exam  Gen: Well-appearing, in no acute distress; non-toxic CV: Regular Rate. Well-perfused. Warm.  Resp: Breathing unlabored on room air; no wheezing. Psych: Fluid speech in conversation;  appropriate affect; normal thought process Neuro: Sensation intact throughout. No gross coordination deficits.   Ortho Exam - Left hip: Well-healed incision from prior THA, there is very mild TTP palpating deep to the rectus femoris tendon today just medial to his hip scar.  Good range of motion.  Imaging: No results found.  Past Medical/Family/Surgical/Social History: Medications & Allergies  reviewed per EMR, new medications updated. 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   Irregular heart beat 04/03/2022   Atrial fibrillation (HCC) 04/03/2022   Atherosclerosis of coronary artery 04/03/2022   Foreign body in respiratory tract 02/05/2020   Status post total replacement of left hip 09/30/2018   Unilateral primary osteoarthritis, left hip 07/27/2018   Lipoma 03/23/2017   Dysfunction of right eustachian tube 11/26/2015   Seasonal allergic rhinitis 11/26/2015   Changing skin lesion 09/20/2015   Migraine, unspecified, not intractable, without status migrainosus 04/29/2015   Hyperlipidemia 04/29/2015   Gastro-esophageal reflux disease without esophagitis 04/29/2015   Essential hypertension 04/29/2015   Contracture of palmar fascia 04/29/2015   Abdominal aortic aneurysm without rupture (HCC) 04/29/2015   Past Medical History:  Diagnosis Date   AAA (abdominal aortic aneurysm) (HCC)    Anxiety    Arthritis    Bulging of cervical intervertebral disc    C6-7   Dupuytren's disease    GERD (gastroesophageal reflux disease)    OTC meds pnr   History of colon polyps    Hyperlipidemia    Hypertension    Migraines    Pneumonia    Sleep apnea    Family History  Problem Relation Age of Onset   Arthritis Mother    Heart disease Mother    Heart attack Mother    Asthma Father    Asthma Paternal Grandfather    Colon cancer Neg Hx    Colon polyps Neg Hx    Esophageal cancer Neg Hx    Rectal cancer Neg Hx    Stomach cancer Neg Hx    Past Surgical History:  Procedure Laterality Date   achilles reattachment     FASCIOTOMY Left 10/11/2015   Procedure: LEFT HAND SUBTOTAL PALMAR FASCIOTOMY WITH REPAIR RECONSTRUCTION ;  Surgeon: Dominica Severin, MD;  Location: [facility];  Service:  Orthopedics;  Laterality: Left;   Left Atrial Mass Excision  Left 05/07/2022   [facility] Dr. Zebedee Iba, with cardiac ablation   LESION DESTRUCTION Bilateral 07/29/2017   Procedure: EXCISION LIP LESION;  Surgeon: Peggye Form, DO;  Location: [facility];  Service: Plastics;  Laterality: Bilateral;   LIPOMA EXCISION Right 07/29/2017   Procedure: EXCISION BACK LIPOMA;  Surgeon: Peggye Form, DO;  Location: [facility];  Service: Plastics;  Laterality: Right;   MAZE Left 05/07/2022   [facility] Dr.Gaca   TOTAL HIP ARTHROPLASTY Left 09/30/2018   Procedure: LEFT TOTAL HIP ARTHROPLASTY ANTERIOR APPROACH;  Surgeon: Kathryne Hitch, MD;  Location: [facility];  Service: Orthopedics;  Laterality: Left;   Social History   Occupational History   Occupation: retired  Tobacco Use   Smoking status: Former    Packs/day: 1.00    Years: 30.00    Additional pack years: 0.00    Total pack years: 30.00    Types: Cigarettes    Quit date: 07/15/1999    Years  since quitting: 23.6   Smokeless tobacco: Never  Vaping Use   Vaping Use: Never used  Substance and Sexual Activity   Alcohol use: Yes    Alcohol/week: 7.0 standard drinks of alcohol    Types: 5 Glasses of wine, 2 Shots of liquor per week    Comment: social, .5 per day   Drug use: No   Sexual activity: Not on file

## 2023-03-12 ENCOUNTER — Encounter: Payer: Self-pay | Admitting: Internal Medicine

## 2023-03-26 ENCOUNTER — Ambulatory Visit: Payer: Medicare Other | Admitting: Sports Medicine

## 2023-03-26 ENCOUNTER — Encounter: Payer: Self-pay | Admitting: Sports Medicine

## 2023-03-26 ENCOUNTER — Other Ambulatory Visit: Payer: Self-pay

## 2023-03-26 DIAGNOSIS — S76819A Strain of other specified muscles, fascia and tendons at thigh level, unspecified thigh, initial encounter: Secondary | ICD-10-CM | POA: Diagnosis not present

## 2023-03-26 DIAGNOSIS — Z96642 Presence of left artificial hip joint: Secondary | ICD-10-CM

## 2023-03-26 DIAGNOSIS — L905 Scar conditions and fibrosis of skin: Secondary | ICD-10-CM | POA: Diagnosis not present

## 2023-03-26 DIAGNOSIS — M25552 Pain in left hip: Secondary | ICD-10-CM

## 2023-03-26 NOTE — Progress Notes (Signed)
Keith Hernandez - 72 y.o. male MRN [id number]  Date of birth: Jan 01, 1951  Office Visit Note: Visit Date: 03/26/2023 PCP: Alysia Penna, MD Referred by: Alysia Penna, MD  Subjective: Chief Complaint  Patient presents with   Left Hip - Follow-up   HPI: Keith Hernandez is a pleasant 72 y.o. male who presents today for follow-up of left chronic hip pain s/p prior THA.  Friend continues to do well.  He has had multiple sessions of extracorporeal shockwave therapy as well as performing his home hip exercises.  Continues to make improvements although has had some mild plateau of improvement from last few treatments.  Overall feels he is about 70% improved.  He has [place] the [place] with his [place] the past few weeks.  Continues with nitroglycerin patch and his home hip stabilization exercises.  Inquiring about possible tendon sheath injection to see if this would further improve his pain.  Pertinent ROS were reviewed with the patient and found to be negative unless otherwise specified above in HPI.   Assessment & Plan: Visit Diagnoses:  1. Strain of rectus femoris muscle   2. Pain in left hip   3. History of left hip replacement   4. Scar tissue    Plan: Keith Hernandez has been making good improvements with his pain and dysfunction about the hip with extracorporeal shockwave therapy, his home exercises and nitroglycerin patch protocol.  We discussed treatment options such as continuing this versus doing a trial of rectus femoris tendon sheath injection and injecting around the scar tissue to help break this up and improve his pain and function about hip.  Through shared decision-making, proceed with ultrasound guided tendon sheath injection.  Patient tolerated well.  We did use ultrasound for diagnostic purposes to evaluate the surrounding structures and the find the exact location for the injectate.  He will allow for 48 hours of modified rest, but then will return to his home hip exercise plan. He  will continue the nitroglycerin patch, in about 2 weeks he will have reached his 12 weeks of use and we will likely discontinue at that time.  We will bring him back in about 10 days to evaluate how he responded to the injection and can consider an additional shockwave treatment for his prolonged trip to the mountains.   Follow-up: Return in about 10 days (around 04/05/2023).   Meds & Orders: No orders of the defined types were placed in this encounter.   Orders Placed This Encounter  Procedures   US Guided Needle Placement - No Linked Charges   [place] Extrem Low Left Ltd     Procedures: US-guided Rectus Femoris Sheath Injection, Left Hip: After discussion on risk/benefits/indications, an informed verbal consent was obtained. A timeout was then performed. The patient was lying supine on examination table with the affected leg relaxed in neutral position. The area overlying the left anterior hip and rectus femoris tendon was prepped with ChloraPrep and multiple alcohol swabs. The ultrasound probe was placed in an oblique plane parallel to tendon musculature and femur. The overlying soft tissue was anesthesized with 3cc of lidocaine 1%. Using ultrasound guidance via an in-plane approach, a 22-gauge, 3.5" needle was inserted from a distal to proximal direction into the rectus femoris tendon sheath and around the area of visualized scar tissue. The tendon sheath and surrounding scar tissue was then injected with a mixture of 2:2:1cc of lidocaine:bupivicaine:betamethasone. Appropriate spread of the injectate within the tendon sheath and around concomitant scar  tissue was visualized with ultrasound guidance. Patient tolerated the procedure well without immediate complications.  A Band-Aid was then applied.       Clinical History: MRI Left Hip IMPRESSION: 1. Status post total left hip arthroplasty. Within the limitation of metallic susceptibility artifact, no large left hip joint effusion, osteolysis or  pseudotumor is identified. 2. Moderate right femoroacetabular osteoarthritis, greatest at the anterior superior aspect.     Electronically Signed   By: Neita Garnet M.D.   On: 12/23/2021 12:39  He reports that he quit smoking about 23 years ago. His smoking use included cigarettes. He started smoking about 53 years ago. He has a 30 pack-year smoking history. He has never used smokeless tobacco. No results for input(s): "HGBA1C", "LABURIC" in the last 8760 hours.  Objective:   Vital Signs: There were no vitals taken for this visit.  Physical Exam  Gen: Well-appearing, in no acute distress; non-toxic CV:  Well-perfused. Warm.  Resp: Breathing unlabored on room air; no wheezing. Psych: Fluid speech in conversation; appropriate affect; normal thought process Neuro: Sensation intact throughout. No gross coordination deficits.   Ortho Exam - Left hip: Well-healed incision from prior THA.  Very mild TTP palpating deep to the rectus femoris tendon just medial to the distal aspect of the THA scar. This is reproducible with active hip flexion.  No pain with internal or external passive logroll of the hip.   Imaging: [place] Extrem Low Left Ltd  Result Date: 03/26/2023 Limited musculoskeletal ultrasound of the left leg, left Anterior musculature was performed today.  Evaluation of the ASIS was seen without cortical irregularity, proper insertion of the sartorius tendon was seen without tearing.  The a IIS was visualized with proper insertion of the rectus femoris, the in this location and just distal to it there is some calcification, possibly indicative of chronic partial tearing.  Scanning in this region overlying the rectus femoris muscle and tendon there is some scar tissue in this area, likely from prior TKA.  Evaluation of the left hip joint shows appropriate artifact from THA, there is no joint effusion noted. *Technically successful ultrasound-guided rectus femoris tendon sheath injection and  associated scar tissue injection.   Past Medical/Family/Surgical/Social History: Medications & Allergies reviewed per EMR, new medications updated. 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   Irregular heart beat 04/03/2022   Atrial fibrillation (HCC) 04/03/2022   Atherosclerosis of coronary artery 04/03/2022   Foreign body in respiratory tract 02/05/2020   Status post total replacement of left hip 09/30/2018   Unilateral primary osteoarthritis, left hip 07/27/2018   Lipoma 03/23/2017   Dysfunction of right eustachian tube 11/26/2015   Seasonal allergic rhinitis 11/26/2015   Changing skin lesion 09/20/2015   Migraine, unspecified, not intractable, without status migrainosus 04/29/2015   Hyperlipidemia 04/29/2015   Gastro-esophageal reflux disease without esophagitis 04/29/2015   Essential hypertension 04/29/2015   Contracture of palmar fascia 04/29/2015   Abdominal aortic aneurysm without rupture (HCC) 04/29/2015   Past Medical History:  Diagnosis Date   AAA (abdominal aortic aneurysm) (HCC)    Anxiety    Arthritis    Bulging of cervical intervertebral disc    C6-7   Dupuytren's disease    GERD (gastroesophageal reflux disease)    OTC meds pnr   History of colon polyps  Hyperlipidemia    Hypertension    Migraines    Pneumonia    Sleep apnea    Family History  Problem Relation Age of Onset   Arthritis Mother    Heart disease Mother    Heart attack Mother    Asthma Father    Asthma Paternal Grandfather    Colon cancer Neg Hx    Colon polyps Neg Hx    Esophageal cancer Neg Hx    Rectal cancer Neg Hx    Stomach cancer Neg Hx    Past Surgical History:  Procedure Laterality Date   achilles reattachment     FASCIOTOMY Left 10/11/2015   Procedure: LEFT HAND SUBTOTAL PALMAR FASCIOTOMY  WITH REPAIR RECONSTRUCTION ;  Surgeon: Dominica Severin, MD;  Location: [facility];  Service: Orthopedics;  Laterality: Left;   Left Atrial Mass Excision  Left 05/07/2022   [facility] Dr. Zebedee Iba, with cardiac ablation   LESION DESTRUCTION Bilateral 07/29/2017   Procedure: EXCISION LIP LESION;  Surgeon: Peggye Form, DO;  Location: [facility];  Service: Plastics;  Laterality: Bilateral;   LIPOMA EXCISION Right 07/29/2017   Procedure: EXCISION BACK LIPOMA;  Surgeon: Peggye Form, DO;  Location: [facility];  Service: Plastics;  Laterality: Right;   MAZE Left 05/07/2022   [facility] Dr.Gaca   TOTAL HIP ARTHROPLASTY Left 09/30/2018   Procedure: LEFT TOTAL HIP ARTHROPLASTY ANTERIOR APPROACH;  Surgeon: Kathryne Hitch, MD;  Location: [facility];  Service: Orthopedics;  Laterality: Left;   Social History   Occupational History   Occupation: retired  Tobacco Use   Smoking status: Former    Current packs/day: 0.00    Average packs/day: 1 pack/day for 30.0 years (30.0 ttl pk-yrs)    Types: Cigarettes    Start date: 07/14/1969    Quit date: 07/15/1999    Years since quitting: 23.7   Smokeless tobacco: Never  Vaping Use   Vaping status: Never Used  Substance and Sexual Activity   Alcohol use: Yes    Alcohol/week: 7.0 standard drinks of alcohol    Types: 5 Glasses of wine, 2 Shots of liquor per week    Comment: social, .5 per day   Drug use: No   Sexual activity: Not on file

## 2023-03-26 NOTE — Progress Notes (Signed)
Doing good; states he noticed a "new area" of pain primarily when he brings his leg up *more inner thigh/groin

## 2023-03-30 ENCOUNTER — Encounter: Payer: Self-pay | Admitting: Sports Medicine

## 2023-04-05 ENCOUNTER — Ambulatory Visit: Payer: Medicare Other | Admitting: Sports Medicine

## 2023-04-05 ENCOUNTER — Encounter: Payer: Self-pay | Admitting: Sports Medicine

## 2023-04-05 DIAGNOSIS — L905 Scar conditions and fibrosis of skin: Secondary | ICD-10-CM

## 2023-04-05 DIAGNOSIS — M25552 Pain in left hip: Secondary | ICD-10-CM

## 2023-04-05 DIAGNOSIS — Z96642 Presence of left artificial hip joint: Secondary | ICD-10-CM | POA: Diagnosis not present

## 2023-04-05 DIAGNOSIS — S76812A Strain of other specified muscles, fascia and tendons at thigh level, left thigh, initial encounter: Secondary | ICD-10-CM | POA: Diagnosis not present

## 2023-04-05 DIAGNOSIS — S76819A Strain of other specified muscles, fascia and tendons at thigh level, unspecified thigh, initial encounter: Secondary | ICD-10-CM

## 2023-04-05 DIAGNOSIS — G8929 Other chronic pain: Secondary | ICD-10-CM

## 2023-04-05 NOTE — Progress Notes (Signed)
States injection has helped a lot; still feels a little twinge but 80% better

## 2023-04-05 NOTE — Progress Notes (Addendum)
Keith Hernandez - 72 y.o. male MRN [id number]  Date of birth: [birth year]  Office Visit Note: Visit Date: 04/05/2023 PCP: Alysia Penna, MD Referred by: Alysia Penna, MD  Subjective: Chief Complaint  Patient presents with   Left Hip - Follow-up   HPI: Keith Hernandez is a very pleasant 72 y.o. male who presents today for chronic left hip pain status post prior THA.  He is doing well.  Last visit on 03/26/2023 we did proceed with an injection in the sheath of the rectus femoris as well as around the scar tissue from his incision.  He states this helped quite a bit.  Working out some of the soreness from the injection but in general his pain is much improved.  Able to get in and out of his car and lift the leg with less pain.  Pertinent ROS were reviewed with the patient and found to be negative unless otherwise specified above in HPI.   Assessment & Plan: Visit Diagnoses:  1. Chronic left hip pain   2. Strain of rectus femoris muscle   3. History of left hip replacement   4. Scar tissue    Plan: Both Dakwon and I are pleased with the improvement of his hip and leg.  I believe his pain is twofold with his prior strain of the rectus femoris, as well as scar tissue from his previous hip replacement that was inhibiting his motion/strength and causing him pain.  Given previous shockwave treatments and a recent injection, this is certainly more doable and he is able to get in and out of car, perform other activities with less pain.  We did repeat 1 additional extracorporeal shockwave treatment today.  We will take a holiday from this and let him continue his home hip exercises for strengthening and stability about the hip and leg.  It has been about 12 weeks from start of nitroglycerin patch protocol, he may complete this for the week and then discontinue this medication going forward.  He will be up in the mountains with his wife, but he may call or return as needed for the hip and  leg.  Follow-up: Return if symptoms worsen or fail to improve.   Meds & Orders: No orders of the defined types were placed in this encounter.   - discontinue Nitroglycerin patch 0.2 mg/hr   Procedures: Procedure: ECSWT Indications:  Rectus femoris tendinopathy   Procedure Details Consent: Risks of procedure as well as the alternatives and risks of each were explained to the patient.  Verbal consent for procedure obtained. Time Out: Verified patient identification, verified procedure, site was marked, verified correct patient position. The area was cleaned with alcohol swab.     The Left rectus femoris tendon and anterolateral hip was targeted for Extracorporeal shockwave therapy.    Preset: Status post muscular injury Power Level: 120 mJ  Frequency: 14 Hz Impulse/cycles: 3500   Head size: Regular   Patient tolerated procedure well without immediate complications.       Clinical History: MRI Left Hip IMPRESSION: 1. Status post total left hip arthroplasty. Within the limitation of metallic susceptibility artifact, no large left hip joint effusion, osteolysis or pseudotumor is identified. 2. Moderate right femoroacetabular osteoarthritis, greatest at the anterior superior aspect.     Electronically Signed   By: Neita Garnet M.D.   On: 12/23/2021 12:39  He reports that he quit smoking about 23 years ago. His smoking use included cigarettes. He started smoking about 53 years  ago. He has a 30 pack-year smoking history. He has never used smokeless tobacco. No results for input(s): "HGBA1C", "LABURIC" in the last 8760 hours.  Objective:   Vital Signs: There were no vitals taken for this visit.  Physical Exam  Gen: Well-appearing, in no acute distress; non-toxic CV: Well-perfused. Warm.  Resp: Breathing unlabored on room air; no wheezing. Psych: Fluid speech in conversation; appropriate affect; normal thought process Neuro: Sensation intact throughout. No gross coordination  deficits.   Ortho Exam - Left hip/leg: Small ecchymosis from prior injection over the anterior thigh.  Well-healed incision from prior THA.  There is good active and passive range of motion with flexion and circumduction of the hip.  No mechanical blocks to logroll testing.  Improved strength and less pain with hip flexion.  Imaging: No results found.  Past Medical/Family/Surgical/Social History: Medications & Allergies reviewed per EMR, new medications updated. 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   Irregular heart beat 04/03/2022   Atrial fibrillation (HCC) 04/03/2022   Atherosclerosis of coronary artery 04/03/2022   Foreign body in respiratory tract 02/05/2020   Status post total replacement of left hip 09/30/2018   Unilateral primary osteoarthritis, left hip 07/27/2018   Lipoma 03/23/2017   Dysfunction of right eustachian tube 11/26/2015   Seasonal allergic rhinitis 11/26/2015   Changing skin lesion 09/20/2015   Migraine, unspecified, not intractable, without status migrainosus 04/29/2015   Hyperlipidemia 04/29/2015   Gastro-esophageal reflux disease without esophagitis 04/29/2015   Essential hypertension 04/29/2015   Contracture of palmar fascia 04/29/2015   Abdominal aortic aneurysm without rupture (HCC) 04/29/2015   Past Medical History:  Diagnosis Date   AAA (abdominal aortic aneurysm) (HCC)    Anxiety    Arthritis    Bulging of cervical intervertebral disc    C6-7   Dupuytren's disease    GERD (gastroesophageal reflux disease)    OTC meds pnr   History of colon polyps    Hyperlipidemia    Hypertension    Migraines    Pneumonia    Sleep apnea    Family History  Problem Relation Age of Onset   Arthritis Mother    Heart disease Mother    Heart attack Mother    Asthma Father     Asthma Paternal Grandfather    Colon cancer Neg Hx    Colon polyps Neg Hx    Esophageal cancer Neg Hx    Rectal cancer Neg Hx    Stomach cancer Neg Hx    Past Surgical History:  Procedure Laterality Date   achilles reattachment     FASCIOTOMY Left 10/11/2015   Procedure: LEFT HAND SUBTOTAL PALMAR FASCIOTOMY WITH REPAIR RECONSTRUCTION ;  Surgeon: Dominica Severin, MD;  Location: [facility];  Service: Orthopedics;  Laterality: Left;   Left Atrial Mass Excision  Left 05/07/2022   [facility] Dr. Zebedee Iba, with cardiac ablation   LESION DESTRUCTION Bilateral 07/29/2017   Procedure: EXCISION LIP LESION;  Surgeon: Peggye Form, DO;  Location: [facility];  Service: Plastics;  Laterality: Bilateral;   LIPOMA EXCISION Right 07/29/2017   Procedure: EXCISION BACK LIPOMA;  Surgeon: Peggye Form, DO;  Location: [facility];  Service: Plastics;  Laterality: Right;   MAZE Left 05/07/2022   [facility] Dr.Gaca   TOTAL HIP ARTHROPLASTY  Left 09/30/2018   Procedure: LEFT TOTAL HIP ARTHROPLASTY ANTERIOR APPROACH;  Surgeon: Kathryne Hitch, MD;  Location: [facility];  Service: Orthopedics;  Laterality: Left;   Social History   Occupational History   Occupation: retired  Tobacco Use   Smoking status: Former    Current packs/day: 0.00    Average packs/day: 1 pack/day for 30.0 years (30.0 ttl pk-yrs)    Types: Cigarettes    Start date: 07/14/1969    Quit date: 07/15/1999    Years since quitting: 23.7   Smokeless tobacco: Never  Vaping Use   Vaping status: Never Used  Substance and Sexual Activity   Alcohol use: Yes    Alcohol/week: 7.0 standard drinks of alcohol    Types: 5 Glasses of wine, 2 Shots of liquor per week    Comment: social, .5 per day   Drug use: No   Sexual activity: Not on file

## 2023-04-06 ENCOUNTER — Encounter: Payer: Self-pay | Admitting: Sports Medicine

## 2023-05-03 ENCOUNTER — Encounter: Payer: Self-pay | Admitting: Sports Medicine

## 2023-06-23 ENCOUNTER — Other Ambulatory Visit: Payer: Self-pay | Admitting: Adult Health

## 2023-06-23 DIAGNOSIS — I480 Paroxysmal atrial fibrillation: Secondary | ICD-10-CM

## 2023-06-23 NOTE — Telephone Encounter (Signed)
Prescription refill request for Eliquis received. Indication: Afib  Last office visit:01/13/23 ([facility])  Scr: 1.10 (06/19/22)  Age: 72 Weight: 88.9kg  Labs overdue. Pt has a scheduled appt with provider on 07/21/23. Placed on on upcoming appt to draw updated labs. Refill sent to prevent any missed doses.

## 2023-07-13 DIAGNOSIS — K080 Exfoliation of teeth due to systemic causes: Secondary | ICD-10-CM | POA: Diagnosis not present

## 2023-07-18 NOTE — Progress Notes (Unsigned)
Cardiology Office Note:  .   Date:  07/21/2023  ID:  Keith Hernandez, DOB April 26, 1951, MRN [id number] PCP: Alysia Penna, MD  [facility] Providers Cardiologist:  Reatha Harps, MD   History of Present Illness: .   Keith Hernandez is a 72 y.o. male with below history who presents for follow-up.   History of Present Illness   The patient, a 72 year old with a history of atrial myxoma status post resection, paroxysmal atrial fibrillation status post maze procedure, hyperlipidemia, hypertension, and AAA, presents for follow-up. He reports that his atrial fibrillation has been well controlled on flecainide, with no recurrence of myxoma on recent echocardiogram. However, he has noticed occasional "blips" that he describes as different from his pre-surgery skip beats. These episodes occur once or twice a day, usually at night, and have increased slightly over time. On one occasion, he experienced a sequence of these "blips" that felt like a "little twist of the heart." He has been managing these episodes by taking one pill of flecainide as needed, which seems to stop the symptoms. He also continues to take one metoprolol daily.  The patient has also noticed that he gets out of breath during long, extended elevation walks, which he attributes to not having fully recovered from his surgery. He has been less active recently due to a hip issue and a back injury. He has been doing physical therapy for his hip and is scheduled to see an orthopedist for his back.          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 Paroxysmal Afib -surgical PVI 8/24/82023 3. Coronary calcium 11 (20th percentile) -<25% stenosis CCTA 03/11/2022 4. HLD -T chol 126, HDL 43, LDL 71, TG 62 5. HTN 6. AAA -3.7 cm    ROS: All other ROS reviewed and negative. Pertinent positives noted in the HPI.     Studies Reviewed: Marland Kitchen   EKG Interpretation Date/Time:  Wednesday July 21 2023 09:00:47  EST Ventricular Rate:  62 PR Interval:  190 QRS Duration:  90 QT Interval:  400 QTC Calculation: 406 R Axis:   64  Text Interpretation: Normal sinus rhythm Normal ECG Confirmed by Lennie Odor [phone]) on 07/21/2023 9:01:35 AM    TTE 06/22/2022  1. Left ventricular ejection fraction, by estimation, is 55 to 60%. The  left ventricle has normal function. The left ventricle has no regional  wall motion abnormalities. Left ventricular diastolic parameters were  normal.   2. Right ventricular systolic function is normal. The right ventricular  size is normal.   3. The mitral valve is grossly normal. Mild mitral valve regurgitation.   4. The aortic valve is tricuspid. Aortic valve regurgitation is trivial.  Aortic valve sclerosis is present, with no evidence of aortic valve  stenosis.   5. Aortic dilatation noted. There is borderline dilatation of the aortic  root, measuring 39 mm. There is borderline dilatation of the ascending  aorta, measuring 39 mm.   6. The inferior vena cava is normal in size with <50% respiratory  variability, suggesting right atrial pressure of 8 mmHg.  Physical Exam:   VS:  BP (!) 152/90 (BP Location: Right Arm, Patient Position: Sitting, Cuff Size: Normal)   Pulse 62   Ht 5\' 11"  (1.803 m)   Wt 204 lb 12.8 oz (92.9 kg)   SpO2 98%   BMI 28.56 kg/m    Wt Readings from Last 3 Encounters:  07/21/23 204 lb 12.8  oz (92.9 kg)  03/04/23 196 lb (88.9 kg)  02/25/23 196 lb (88.9 kg)    GEN: Well nourished, well developed in no acute distress NECK: No JVD; No carotid bruits CARDIAC: RRR, no murmurs, rubs, gallops RESPIRATORY:  Clear to auscultation without rales, wheezing or rhonchi  ABDOMEN: Soft, non-tender, non-distended EXTREMITIES:  No edema; No deformity  ASSESSMENT AND PLAN: .   Assessment and Plan    Atrial Myxoma, status post resection No recurrence on recent echocardiogram. Requires surveillance. -Schedule echocardiogram for  surveillance.  Paroxysmal Atrial Fibrillation Well controlled on flecainide. Patient reports occasional PACs. EKG shows normal rhythm. -Continue flecainide 50mg  daily and metoprolol succinate 50mg  daily. -Encourage regular exercise. -on eliquis.   Nonobstructive CAD No chest pain. Patient is on Eliquis. Lipids were controlled last year. -Continue Eliquis. No need for aspirin. -Follow up with primary care for lipid panel.  Hypertension Blood pressure controlled at home, slightly elevated today. -Continue current management. Monitor blood pressure.  Abdominal Aortic Aneurysm (AAA) Stable at 37mm. Followed by vascular surgery. -Continue follow up with vascular surgery.  Follow up in 1 year.              Follow-up: Return in about 1 year (around 07/20/2024).  Time Spent with Patient: I have spent a total of 25 minutes caring for this patient today face to face, ordering and reviewing labs/tests, reviewing prior records/medical history, examining the patient, establishing an assessment and plan, communicating results/findings to the patient/family, and documenting in the medical record.   Signed, Lenna Gilford. Flora Lipps, MD, [facility]  [facility]  [address] [place], [place] [address] [phone]  9:25 AM

## 2023-07-21 ENCOUNTER — Encounter: Payer: Self-pay | Admitting: Cardiovascular Disease

## 2023-07-21 ENCOUNTER — Ambulatory Visit: Payer: Medicare Other | Attending: Cardiovascular Disease | Admitting: Cardiovascular Disease

## 2023-07-21 VITALS — BP 152/90 | HR 62 | Ht 71.0 in | Wt 204.8 lb

## 2023-07-21 DIAGNOSIS — E7800 Pure hypercholesterolemia, unspecified: Secondary | ICD-10-CM

## 2023-07-21 DIAGNOSIS — I480 Paroxysmal atrial fibrillation: Secondary | ICD-10-CM | POA: Diagnosis not present

## 2023-07-21 DIAGNOSIS — D151 Benign neoplasm of heart: Secondary | ICD-10-CM | POA: Diagnosis not present

## 2023-07-21 DIAGNOSIS — I10 Essential (primary) hypertension: Secondary | ICD-10-CM

## 2023-07-21 DIAGNOSIS — M545 Low back pain, unspecified: Secondary | ICD-10-CM | POA: Diagnosis not present

## 2023-07-21 DIAGNOSIS — Z9889 Other specified postprocedural states: Secondary | ICD-10-CM

## 2023-07-21 NOTE — Patient Instructions (Addendum)
Medication Instructions:  NO Changes    *If you need a refill on your cardiac medications before your next appointment, please call your pharmacy*   Lab Work: None    If you have labs (blood work) drawn today and your tests are completely normal, you will receive your results only by: MyChart Message (if you have MyChart) OR A paper copy in the mail If you have any lab test that is abnormal or we need to change your treatment, we will call you to review the results.   Testing/Procedures:   Echo will be scheduled at [address].  Your physician has requested that you have an echocardiogram. Echocardiography is a painless test that uses sound waves to create images of your heart. It provides your doctor with information about the size and shape of your heart and how well your heart's chambers and valves are working. This procedure takes approximately one hour. There are no restrictions for this procedure. Please do NOT wear cologne, perfume, aftershave, or lotions (deodorant is allowed). Please arrive 15 minutes prior to your appointment time.    Follow-Up: At [facility], you and your health needs are our priority.  As part of our continuing mission to provide you with exceptional heart care, we have created designated Provider Care Teams.  These Care Teams include your primary Cardiologist (physician) and Advanced Practice Providers (APPs -  Physician Assistants and Nurse Practitioners) who all work together to provide you with the care you need, when you need it.  We recommend signing up for the patient portal called "MyChart".  Sign up information is provided on this After Visit Summary.  MyChart is used to connect with patients for Virtual Visits (Telemedicine).  Patients are able to view lab/test results, encounter notes, upcoming appointments, etc.  Non-urgent messages can be sent to your provider as well.   To learn more about what you can do with MyChart, go to  [email or web address].    Your next appointment:   1 year(s)  The format for your next appointment:   In Person  Provider:   Reatha Harps, MD    Other Instructions

## 2023-07-23 ENCOUNTER — Encounter: Payer: Self-pay | Admitting: Cardiovascular Disease

## 2023-07-26 ENCOUNTER — Ambulatory Visit: Payer: Medicare Other | Admitting: Sports Medicine

## 2023-08-10 DIAGNOSIS — E785 Hyperlipidemia, unspecified: Secondary | ICD-10-CM | POA: Diagnosis not present

## 2023-08-10 DIAGNOSIS — E559 Vitamin D deficiency, unspecified: Secondary | ICD-10-CM | POA: Diagnosis not present

## 2023-08-10 DIAGNOSIS — Z125 Encounter for screening for malignant neoplasm of prostate: Secondary | ICD-10-CM | POA: Diagnosis not present

## 2023-08-10 DIAGNOSIS — I10 Essential (primary) hypertension: Secondary | ICD-10-CM | POA: Diagnosis not present

## 2023-08-10 LAB — LAB REPORT - SCANNED: EGFR (Non-African Amer.): 65.6

## 2023-08-17 DIAGNOSIS — I10 Essential (primary) hypertension: Secondary | ICD-10-CM | POA: Diagnosis not present

## 2023-08-17 DIAGNOSIS — Z1339 Encounter for screening examination for other mental health and behavioral disorders: Secondary | ICD-10-CM | POA: Diagnosis not present

## 2023-08-17 DIAGNOSIS — Z1331 Encounter for screening for depression: Secondary | ICD-10-CM | POA: Diagnosis not present

## 2023-08-17 DIAGNOSIS — Z0000 Encounter for general adult medical examination without abnormal findings: Secondary | ICD-10-CM | POA: Diagnosis not present

## 2023-08-17 DIAGNOSIS — I714 Abdominal aortic aneurysm, without rupture, unspecified: Secondary | ICD-10-CM | POA: Diagnosis not present

## 2023-08-20 ENCOUNTER — Encounter (HOSPITAL_COMMUNITY): Payer: Self-pay

## 2023-08-20 ENCOUNTER — Ambulatory Visit (HOSPITAL_COMMUNITY): Payer: Medicare Other | Attending: Cardiovascular Disease

## 2023-08-20 DIAGNOSIS — M47816 Spondylosis without myelopathy or radiculopathy, lumbar region: Secondary | ICD-10-CM | POA: Diagnosis not present

## 2023-08-24 ENCOUNTER — Ambulatory Visit (HOSPITAL_COMMUNITY): Payer: Medicare Other | Attending: Cardiovascular Disease

## 2023-08-24 DIAGNOSIS — I10 Essential (primary) hypertension: Secondary | ICD-10-CM

## 2023-08-24 DIAGNOSIS — D151 Benign neoplasm of heart: Secondary | ICD-10-CM

## 2023-08-24 LAB — ECHOCARDIOGRAM COMPLETE: S' Lateral: 2.87 cm

## 2023-09-07 DIAGNOSIS — M47816 Spondylosis without myelopathy or radiculopathy, lumbar region: Secondary | ICD-10-CM | POA: Diagnosis not present

## 2023-09-23 ENCOUNTER — Encounter: Payer: Self-pay | Admitting: Sports Medicine

## 2023-09-23 ENCOUNTER — Ambulatory Visit: Payer: Medicare Other | Admitting: Sports Medicine

## 2023-09-23 ENCOUNTER — Other Ambulatory Visit (INDEPENDENT_AMBULATORY_CARE_PROVIDER_SITE_OTHER): Payer: Self-pay

## 2023-09-23 DIAGNOSIS — S76812A Strain of other specified muscles, fascia and tendons at thigh level, left thigh, initial encounter: Secondary | ICD-10-CM

## 2023-09-23 DIAGNOSIS — M25552 Pain in left hip: Secondary | ICD-10-CM

## 2023-09-23 DIAGNOSIS — S76819A Strain of other specified muscles, fascia and tendons at thigh level, unspecified thigh, initial encounter: Secondary | ICD-10-CM

## 2023-09-23 DIAGNOSIS — Z96642 Presence of left artificial hip joint: Secondary | ICD-10-CM

## 2023-09-23 DIAGNOSIS — G8929 Other chronic pain: Secondary | ICD-10-CM

## 2023-09-23 IMAGING — MR MR HIP*L* W/O CM
4 of 6 series · 22 of 40 positions shown · non-contrast
Comparison: Left hip radiographs 09/23/2021

CLINICAL DATA: Chronic hip pain. Labral tear suspected. Left hip
pain during flexing for 1-3 years. Prior hip replacement in 8686.
Assess for fluid collection, hip flexor tendons, implant,
trochanteric area.

EXAM:
MR OF THE LEFT HIP WITHOUT CONTRAST
TECHNIQUE: Multiplanar, multisequence MR imaging was performed. No intravenous
contrast was administered.

[Series 3: T1 · coronal · 4.0mm · 0.78mm/px · 4 of 22 slices shown]
[im 1/22]
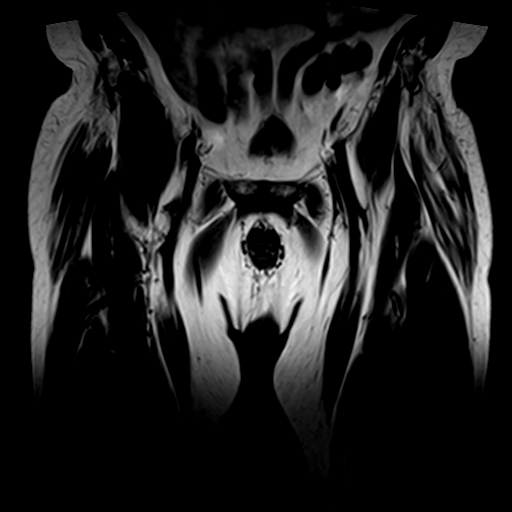
[im 5/22]
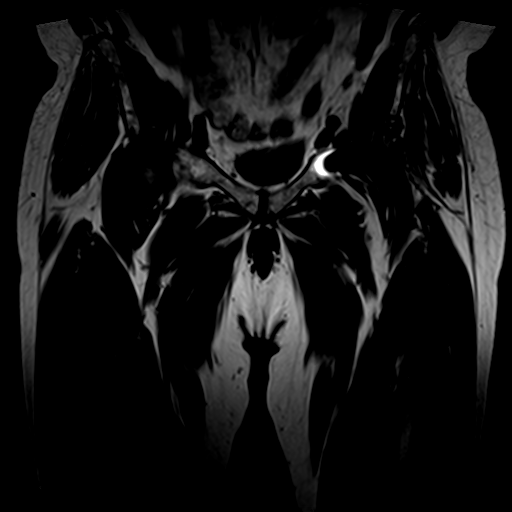
[im 13/22]
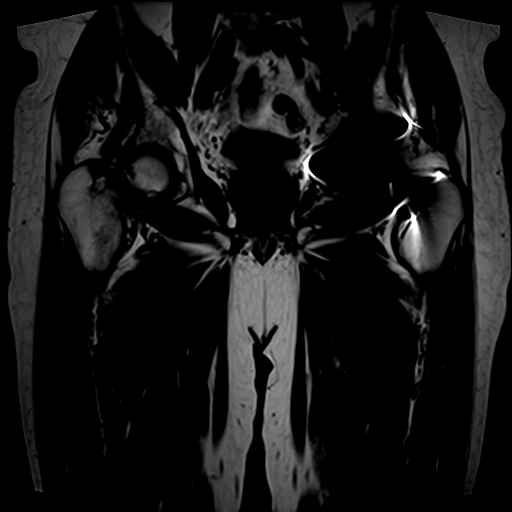
[im 22/22]
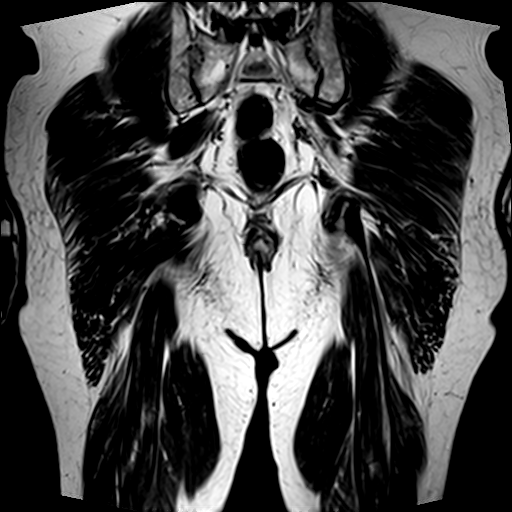

[Series 6: PD · axial · 4.0mm · 0.70mm/px · z∈[-3,+111]mm · 6 of 27 slices shown (1 of 3)]
[im 1/27]
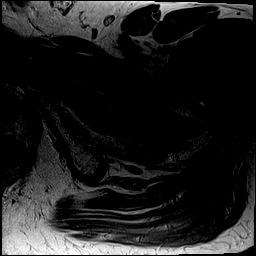
[im 6/27]
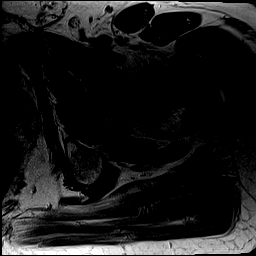
[im 11/27]
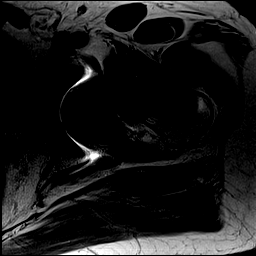
[im 16/27]
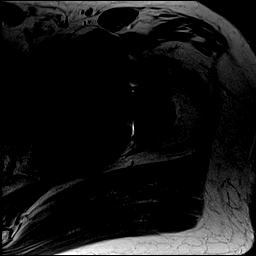
[im 21/27]
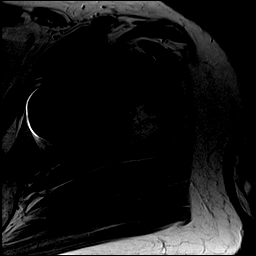
[im 27/27]
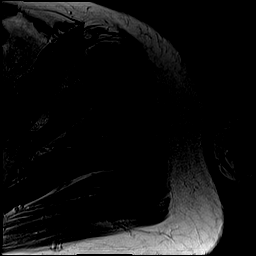

[Series 7: PD · coronal · 4.0mm · 0.45mm/px · 5 of 22 slices shown (2 of 3)]
[im 1/22]
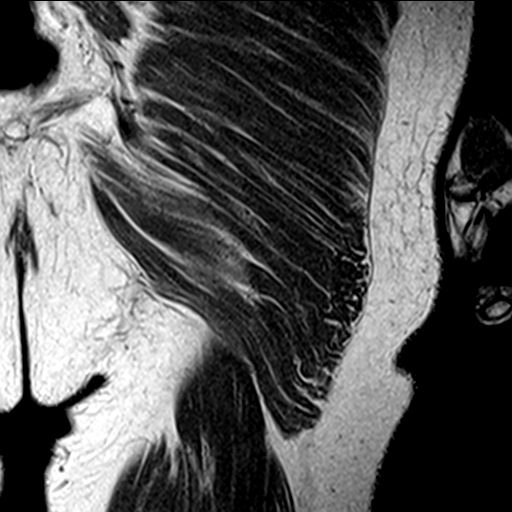
[im 6/22]
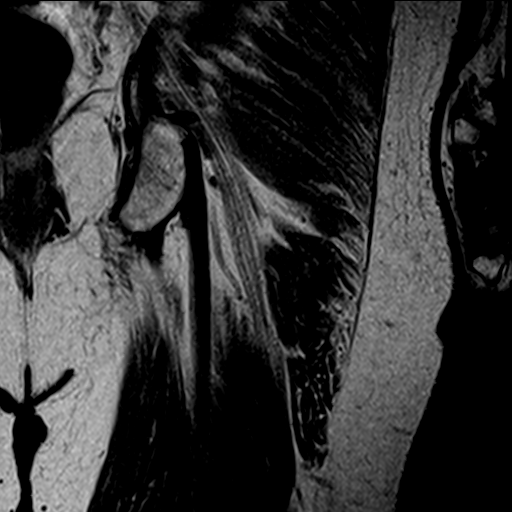
[im 11/22]
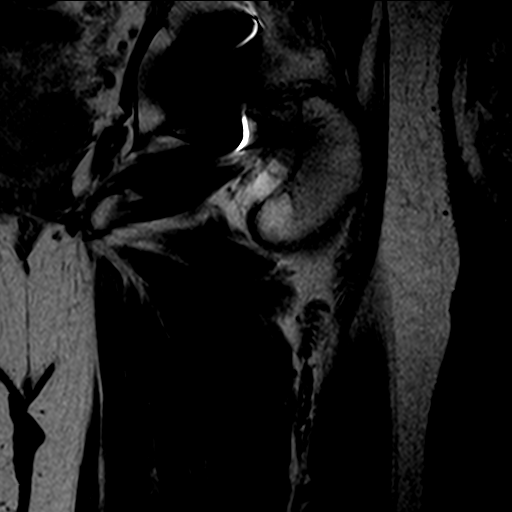
[im 16/22]
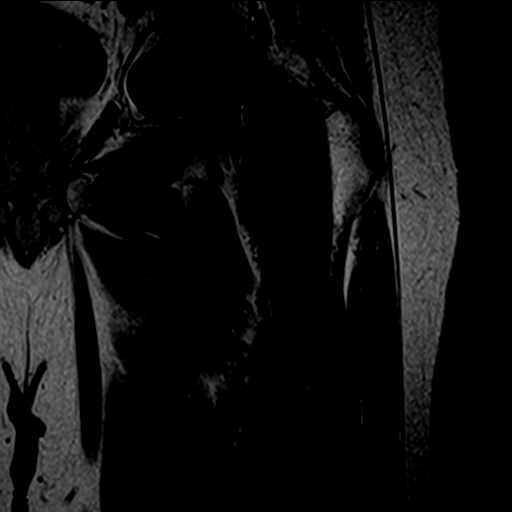
[im 22/22]
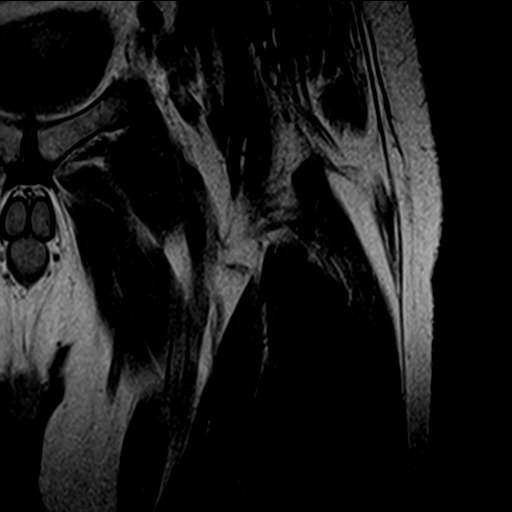

[Series 9: PD · sagittal · 4.0mm · 0.49mm/px · 7 of 30 slices shown (3 of 3)]
[im 1/30]
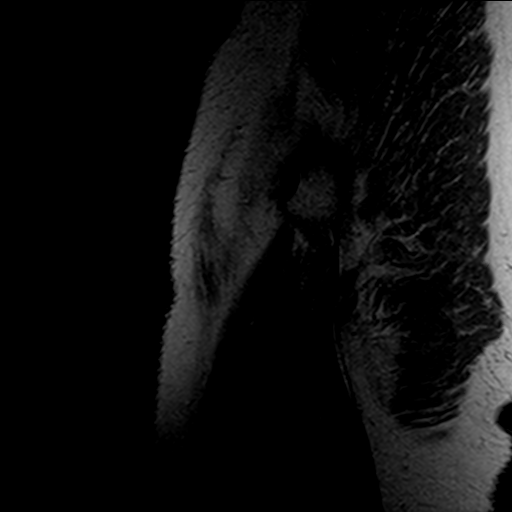
[im 5/30]
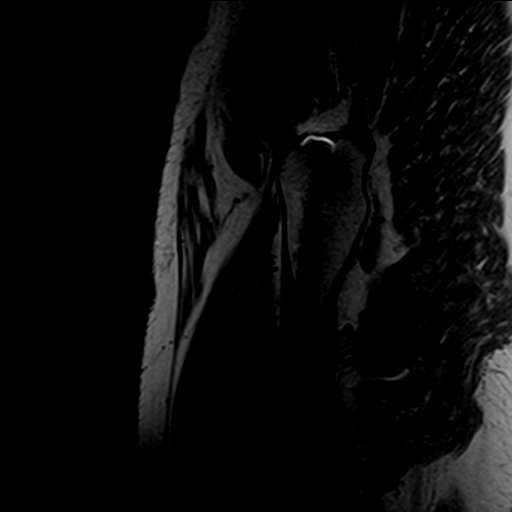
[im 10/30]
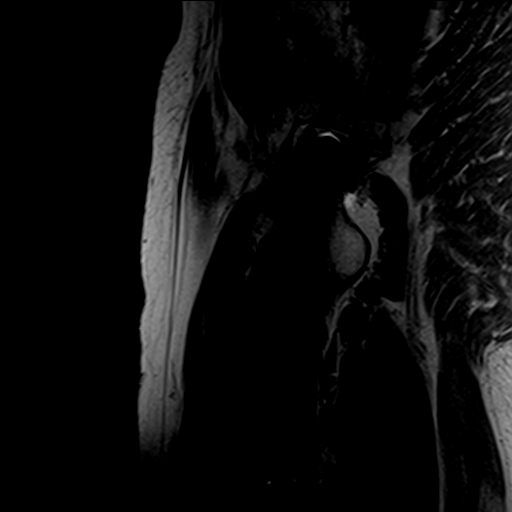
[im 15/30]
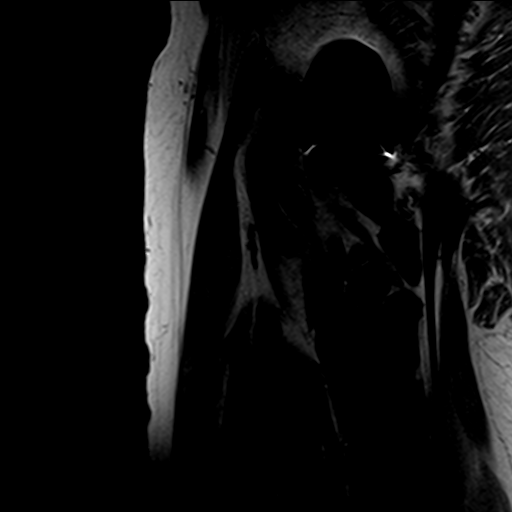
[im 20/30]
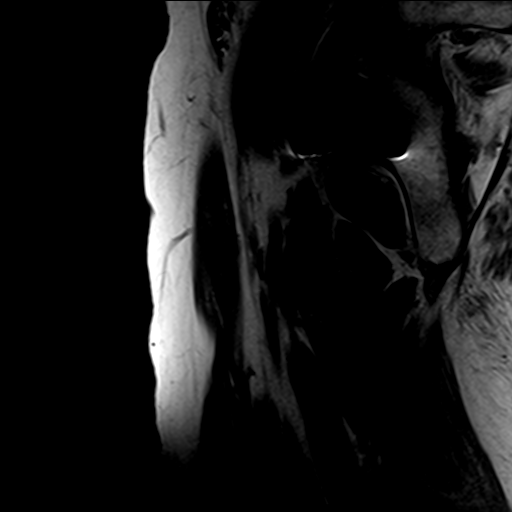
[im 25/30]
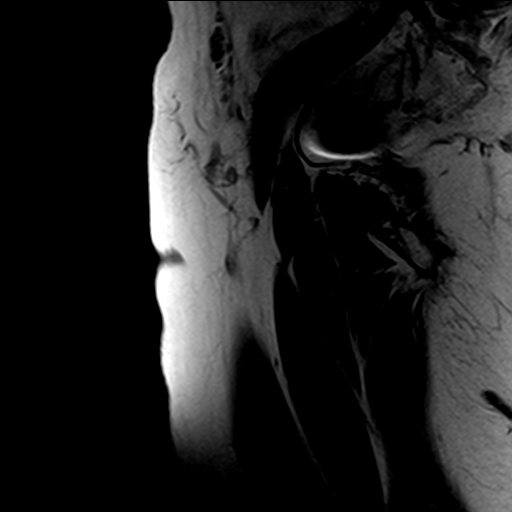
[im 30/30]
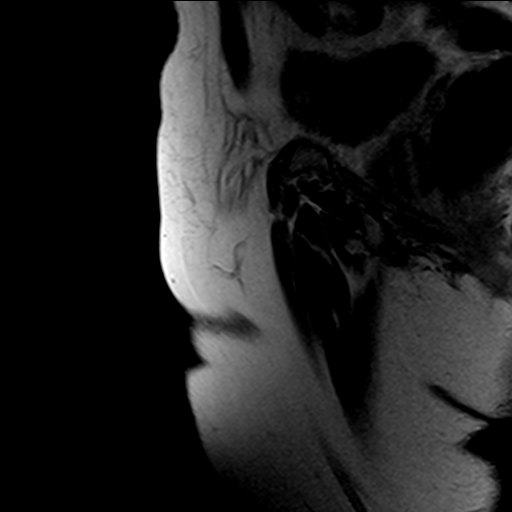

[22 of 40 positions shown; findings below may reference images not displayed]

FINDINGS: Bones: There is extensive metallic susceptibility artifact related
to total left hip arthroplasty hardware obscuring much of the
proximal left femur and left hemipelvis. Within this limitation, no
large left hip joint effusion is seen beyond the artifact. No
definite osteolysis or pseudotumor.

No acute fracture or avascular necrosis.

Articular cartilage and labrum

Left hip:

Articular cartilage:  Status post total left hip arthroplasty.

Labrum:  Status post total left hip arthroplasty.

Right hip:

On large field-of-view images, there is moderate anterior superior
right acetabular subchondral degenerative cystic change. Mild
high-grade anterior superior right acetabular and femoral head
cartilage thinning. Mild anterior superior right femoral head-neck
junction and anteromedial femoral head subchondral degenerative
cystic change. Mild right femoral head peripheral circumferential
degenerative osteophytes.

Joint or bursal effusion

Joint effusion:  No right hip joint effusion.

Bursae: No trochanteric bursitis on either hip.

Muscles and tendons

Muscles and tendons: The origins of the bilateral rectus femoris and
the bilateral common hamstring tendon origins are intact. The right
gluteus minimus, gluteus medius, and iliopsoas tendon insertions are
intact. The corresponding left-sided tendons are grossly intact,
however obscured by artifact.

Other findings

Miscellaneous: The prostate is mildly to moderately enlarged,
measuring up to 5.7 cm in transverse dimension.
IMPRESSION: :
IMPRESSION: 1. Status post total left hip arthroplasty. Within the limitation of
metallic susceptibility artifact, no large left hip joint effusion,
osteolysis or pseudotumor is identified.
2. Moderate right femoroacetabular osteoarthritis, greatest at the
anterior superior aspect.
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
## 2023-09-27 DIAGNOSIS — M47816 Spondylosis without myelopathy or radiculopathy, lumbar region: Secondary | ICD-10-CM | POA: Diagnosis not present

## 2023-09-30 ENCOUNTER — Encounter: Payer: Self-pay | Admitting: Sports Medicine

## 2023-09-30 ENCOUNTER — Other Ambulatory Visit: Payer: Self-pay | Admitting: Cardiovascular Disease

## 2023-09-30 ENCOUNTER — Ambulatory Visit: Payer: Medicare Other | Admitting: Sports Medicine

## 2023-09-30 DIAGNOSIS — S76812A Strain of other specified muscles, fascia and tendons at thigh level, left thigh, initial encounter: Secondary | ICD-10-CM | POA: Diagnosis not present

## 2023-09-30 DIAGNOSIS — I480 Paroxysmal atrial fibrillation: Secondary | ICD-10-CM

## 2023-09-30 DIAGNOSIS — G8929 Other chronic pain: Secondary | ICD-10-CM | POA: Diagnosis not present

## 2023-09-30 DIAGNOSIS — S76819A Strain of other specified muscles, fascia and tendons at thigh level, unspecified thigh, initial encounter: Secondary | ICD-10-CM

## 2023-09-30 DIAGNOSIS — Z96642 Presence of left artificial hip joint: Secondary | ICD-10-CM | POA: Diagnosis not present

## 2023-09-30 DIAGNOSIS — M25552 Pain in left hip: Secondary | ICD-10-CM

## 2023-09-30 NOTE — Progress Notes (Signed)
Keith Hernandez - 73 y.o. male MRN [id number]  Date of birth: 12-31-1950  Office Visit Note: Visit Date: 09/30/2023 PCP: Alysia Penna, MD Referred by: Alysia Penna, MD  Subjective: Chief Complaint  Patient presents with   Left Hip - Follow-up   HPI: Keith Hernandez is a pleasant 73 y.o. male who presents today for  acute on chronic left hip pain.   Our last visit we did repeat our first treatment of extracorporeal shockwave therapy and Keith Hernandez was very pleased with the degree of relief he receives with pain improvement and function improvement even the next day following treatment.  He has returned to consistent home exercise therapy and did bring these today as some of his external rotation does aggravate his symptoms.  Pertinent ROS were reviewed with the patient and found to be negative unless otherwise specified above in HPI.   Assessment & Plan: Visit Diagnoses:  1. Strain of rectus femoris muscle   2. Chronic left hip pain   3. History of left hip replacement    Plan: Keith Hernandez had quite notable improvement of his pain and mild improvement in function following our first repeat treatment of extracorporeal shockwave therapy.  We did repeat ESWT treatment today.  Starting tomorrow he may get back into his home exercises, we did modify these today to help provide improvement in function but not exacerbate his symptoms, this was updated on his sheet today.  He will perform his HEP once daily.  We will plan on a few additional treatments of ESWT until he feels like he is largely improved.  Follow-up: Return for f/u in next 1-2 weeks for R-ant hip.   Meds & Orders: No orders of the defined types were placed in this encounter.  No orders of the defined types were placed in this encounter.    Procedures: Procedure: ECSWT Indications:  Rectus femoris tendinopathy   Procedure Details Consent: Risks of procedure as well as the alternatives and risks of each were explained to the  patient.  Verbal consent for procedure obtained. Time Out: Verified patient identification, verified procedure, site was marked, verified correct patient position. The area was cleaned with alcohol swab.     The Left rectus femoris tendon and anterolateral hip was targeted for Extracorporeal shockwave therapy.    Preset: Status post muscular injury Power Level: 120 mJ   Frequency: 15 Hz Impulse/cycles: 3500   Head size: Regular   Patient tolerated procedure well without immediate complications       Clinical History:   He reports that he quit smoking about 24 years ago. His smoking use included cigarettes. He started smoking about 54 years ago. He has a 30 pack-year smoking history. He has never used smokeless tobacco. No results for input(s): "HGBA1C", "LABURIC" in the last 8760 hours.  Objective:    Physical Exam  Gen: Well-appearing, in no acute distress; non-toxic CV: Well-perfused. Warm.  Resp: Breathing unlabored on room air; no wheezing. Psych: Fluid speech in conversation; appropriate affect; normal thought process  Ortho Exam - Left hip: Unchanged from prior exam other than: Straight leg raise today does not reproduce pain, mild pain with straight leg raise and external rotated position.  Imaging: No results found.  Past Medical/Family/Surgical/Social History: Medications & Allergies reviewed per EMR, new medications updated. 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   Irregular heart beat 04/03/2022   Atrial fibrillation (HCC) 04/03/2022   Atherosclerosis of coronary artery 04/03/2022   Foreign body in respiratory tract 02/05/2020   Status post total replacement of left hip 09/30/2018   Unilateral primary osteoarthritis, left hip 07/27/2018   Lipoma 03/23/2017   Dysfunction of right  eustachian tube 11/26/2015   Seasonal allergic rhinitis 11/26/2015   Changing skin lesion 09/20/2015   Migraine, unspecified, not intractable, without status migrainosus 04/29/2015   Hyperlipidemia 04/29/2015   Gastro-esophageal reflux disease without esophagitis 04/29/2015   Essential hypertension 04/29/2015   Contracture of palmar fascia 04/29/2015   Abdominal aortic aneurysm without rupture (HCC) 04/29/2015   Past Medical History:  Diagnosis Date   AAA (abdominal aortic aneurysm) (HCC)    Anxiety    Arthritis    Bulging of cervical intervertebral disc    C6-7   Dupuytren's disease    GERD (gastroesophageal reflux disease)    OTC meds pnr   History of colon polyps    Hyperlipidemia    Hypertension    Migraines    Pneumonia    Sleep apnea    Family History  Problem Relation Age of Onset   Arthritis Mother    Heart disease Mother    Heart attack Mother    Asthma Father    Asthma Paternal Grandfather    Colon cancer Neg Hx    Colon polyps Neg Hx    Esophageal cancer Neg Hx    Rectal cancer Neg Hx    Stomach cancer Neg Hx    Past Surgical History:  Procedure Laterality Date   achilles reattachment     FASCIOTOMY Left 10/11/2015   Procedure: LEFT HAND SUBTOTAL PALMAR FASCIOTOMY WITH REPAIR RECONSTRUCTION ;  Surgeon: Dominica Severin, MD;  Location: [facility];  Service: Orthopedics;  Laterality: Left;   Left Atrial Mass Excision  Left 05/07/2022   [facility] Dr. Zebedee Iba, with cardiac ablation   LESION DESTRUCTION Bilateral 07/29/2017   Procedure: EXCISION LIP LESION;  Surgeon: Peggye Form, DO;  Location: [facility];  Service: Plastics;  Laterality: Bilateral;   LIPOMA EXCISION Right 07/29/2017   Procedure: EXCISION BACK LIPOMA;  Surgeon: Peggye Form, DO;  Location: [facility];  Service: Plastics;  Laterality: Right;   MAZE Left 05/07/2022   [facility] Dr.Gaca   TOTAL HIP ARTHROPLASTY Left 09/30/2018   Procedure: LEFT  TOTAL HIP ARTHROPLASTY ANTERIOR APPROACH;  Surgeon: Kathryne Hitch, MD;  Location: [facility];  Service: Orthopedics;  Laterality: Left;   Social History   Occupational History   Occupation: retired  Tobacco Use   Smoking status: Former    Current packs/day: 0.00    Average packs/day: 1 pack/day for 30.0 years (30.0 ttl pk-yrs)    Types: Cigarettes    Start date: 07/14/1969    Quit date: 07/15/1999    Years since quitting: 24.2   Smokeless tobacco: Never  Vaping Use   Vaping status: Never Used  Substance and Sexual Activity   Alcohol use: Yes    Alcohol/week: 7.0 standard drinks of alcohol    Types: 5 Glasses of wine, 2 Shots of liquor per week    Comment: social, .5 per day   Drug use: No   Sexual activity: Not on file

## 2023-09-30 NOTE — Progress Notes (Signed)
Patient says that he feels noticeably better since his last visit. He says that he had some soreness after the shockwave that had gone away by the next morning. He says that he is not taking pain medication.

## 2023-09-30 NOTE — Telephone Encounter (Signed)
Eliquis 5mg  refill request received. Patient is 73 years old, weight-92.9kg, Crea-1.1 on 08/10/23 via scanned labs from PCP, Diagnosis-Afib, and last seen by Dr. Flora Lipps on 07/21/23. Dose is appropriate based on dosing criteria. Will send in refill to requested pharmacy.

## 2023-10-11 ENCOUNTER — Ambulatory Visit: Payer: Medicare Other | Admitting: Sports Medicine

## 2023-10-11 ENCOUNTER — Encounter: Payer: Self-pay | Admitting: Sports Medicine

## 2023-10-11 DIAGNOSIS — G8929 Other chronic pain: Secondary | ICD-10-CM | POA: Diagnosis not present

## 2023-10-11 DIAGNOSIS — S76819A Strain of other specified muscles, fascia and tendons at thigh level, unspecified thigh, initial encounter: Secondary | ICD-10-CM | POA: Diagnosis not present

## 2023-10-11 DIAGNOSIS — M25552 Pain in left hip: Secondary | ICD-10-CM | POA: Diagnosis not present

## 2023-10-11 DIAGNOSIS — M791 Myalgia, unspecified site: Secondary | ICD-10-CM

## 2023-10-11 MED ORDER — METHYLPREDNISOLONE ACETATE 40 MG/ML IJ SUSP
40.0000 mg | INTRAMUSCULAR | Status: AC | PRN
  Administered 2023-10-11: 40 mg via INTRAMUSCULAR

## 2023-10-11 MED ORDER — LIDOCAINE HCL 1 % IJ SOLN
1.0000 mL | INTRAMUSCULAR | Status: AC | PRN
  Administered 2023-10-11: 1 mL

## 2023-10-11 MED ORDER — BUPIVACAINE HCL 0.25 % IJ SOLN
1.0000 mL | INTRAMUSCULAR | Status: AC | PRN
Start: 2023-10-11 — End: 2023-10-11
  Administered 2023-10-11: 1 mL

## 2023-10-11 NOTE — Progress Notes (Signed)
Patient says that his pain and soreness in the original location has remained about the same, and he now also has some pain and soreness more lateral to the initial location of pain. He has still been doing his exercises at home and completing 30 repetitions of each, although he says that the leg raises are more challenging and more painful. He says that he noticed it the day before his last appointment, and it has not gone away since then. He primarily feels it in the upper half of hip flexion, as well as hip flexion with hip rotation.

## 2023-10-11 NOTE — Progress Notes (Signed)
Keith Hernandez - 73 y.o. male MRN [id number]  Date of birth: [birth year]  Office Visit Note: Visit Date: 10/11/2023 PCP: Alysia Penna, MD Referred by: Alysia Penna, MD  Subjective: Chief Complaint  Patient presents with   Left Hip - Follow-up   HPI: Keith Hernandez is a pleasant 73 y.o. male who presents today for follow-up of left anterior hip/thigh pain.  He is status post THA.  We have been treating him currently and in the past for resulting anterior hip flexor related pain.  Our initial shockwave treatment on 09/23/2023 provided good relief but he did not receive much from on 09/30/2023.  He does like he may have exacerbated these completing some of his home exercises, we did modify these at last visit.  Pertinent ROS were reviewed with the patient and found to be negative unless otherwise specified above in HPI.   Assessment & Plan: Visit Diagnoses:  1. Chronic left hip pain   2. Strain of rectus femoris muscle   3. Trigger point    Plan: Impression is worsening anterior hip/quadricep tendon pain, which had improved in the past from extracorporeal shockwave therapy.  I feel like Olander may have exacerbated these with return of some of his home exercises.  Given the degree of his discomfort, I would like him to stop all of his home exercises for the next 10 days.  He had pain in the muscle ability just distal and medial to his scar tissue from his hip incision, he had associated trigger points here in the muscle which we did perform 2 subsequent trigger point injections today.  Advised on modified rest for today and tomorrow and then may return to activity without restriction as his pain allows.  He may use ice or heat as well as very infrequent NSAID and Tylenol use.  He will notify me after he returns from his trip in about 2 weeks to see where he is at and we will consider additional workup if needed.  Could consider a few shockwave treatments in the future depending on his  improvement.  Follow-up: Return if symptoms worsen or fail to improve, for Notify of improvement in 2 weeks.   Meds & Orders: No orders of the defined types were placed in this encounter.   Orders Placed This Encounter  Procedures   Trigger Point Inj     Procedures: Trigger Point Inj  Date/Time: 10/11/2023 5:44 PM  Performed by: Madelyn Brunner, DO Authorized by: Madelyn Brunner, DO   Consent Given by:  Patient Site marked: the procedure site was marked   Timeout: prior to procedure the correct patient, procedure, and site was verified   Indications:  Pain and therapeutic Total # of Trigger Points:  2 Location: lower extremity   Needle Size:  25 G Approach:  Dorsal Medications #1:  1 mL lidocaine 1 %; 1 mL bupivacaine 0.25 % Medications #2:  40 mg methylPREDNISolone acetate 40 MG/ML; 1 mL lidocaine 1 %; 1 mL bupivacaine 0.25 % Patient tolerance:  Patient tolerated the procedure well with no immediate complications Comments: Procedure: Trigger point injections (2), left thigh/quadriceps After discussion on R/B/I and informed verbal consent was obtained, a timeout was conducted. The patient was placed in a prone position on the examination table and the area of maximal tenderness was identified over the anterior thigh (vastus lateralis region).  This area was cleansed with chloraprep and multiple alcohol swabs. Ethyl chloride was used for local anesthesia. Using a 25-gauge 1.0 inch needle  the trigger point(s) was subsequently injected with a mixture of 1 cc of methylprednisolone 40 mg/mL and 2 cc of 1% lidocaine without epinephrine and 2 cc of bupivicaine 0.25%, with a total of 2.5 cc of injectate into each trigger point (2). A band-aid was applied following. Patient tolerated procedure well, there were no post-injection complications. Post-procedure instructions were given.         Clinical History:  He reports that he quit smoking about 24 years ago. His smoking use included cigarettes.  He started smoking about 54 years ago. He has a 30 pack-year smoking history. He has never used smokeless tobacco. No results for input(s): "HGBA1C", "LABURIC" in the last 8760 hours.  Objective:    Physical Exam  Gen: Well-appearing, in no acute distress; non-toxic CV: Well-perfused. Warm.  Resp: Breathing unlabored on room air; no wheezing. Psych: Fluid speech in conversation; appropriate affect; normal thought process  Ortho Exam - LLE: + Mild TTP over the mid aspect of the quadricep tendon/vastus lateralis just medial to his distal THA incision.  No redness or swelling overlying.  There is reproduction of pain with resisted hip flexion and SLR.  Imaging: No results found.  Past Medical/Family/Surgical/Social History: Medications & Allergies reviewed per EMR, new medications updated. 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   Irregular heart beat 04/03/2022   Atrial fibrillation (HCC) 04/03/2022   Atherosclerosis of coronary artery 04/03/2022   Foreign body in respiratory tract 02/05/2020   Status post total replacement of left hip 09/30/2018   Unilateral primary osteoarthritis, left hip 07/27/2018   Lipoma 03/23/2017   Dysfunction of right eustachian tube 11/26/2015   Seasonal allergic rhinitis 11/26/2015   Changing skin lesion 09/20/2015   Migraine, unspecified, not intractable, without status migrainosus 04/29/2015   Hyperlipidemia 04/29/2015   Gastro-esophageal reflux disease without esophagitis 04/29/2015   Essential hypertension 04/29/2015   Contracture of palmar fascia 04/29/2015   Abdominal aortic aneurysm without rupture (HCC) 04/29/2015   Past Medical History:  Diagnosis Date   AAA (abdominal aortic aneurysm) (HCC)    Anxiety    Arthritis    Bulging of cervical intervertebral disc     C6-7   Dupuytren's disease    GERD (gastroesophageal reflux disease)    OTC meds pnr   History of colon polyps    Hyperlipidemia    Hypertension    Migraines    Pneumonia    Sleep apnea    Family History  Problem Relation Age of Onset   Arthritis Mother    Heart disease Mother    Heart attack Mother    Asthma Father    Asthma Paternal Grandfather    Colon cancer Neg Hx    Colon polyps Neg Hx    Esophageal cancer Neg Hx    Rectal cancer Neg Hx    Stomach cancer Neg Hx    Past Surgical History:  Procedure Laterality Date   achilles reattachment     FASCIOTOMY Left 10/11/2015   Procedure: LEFT HAND SUBTOTAL PALMAR FASCIOTOMY WITH REPAIR RECONSTRUCTION ;  Surgeon: Dominica Severin, MD;  Location: [facility];  Service: Orthopedics;  Laterality: Left;   Left Atrial Mass Excision  Left 05/07/2022   [facility] Dr. Zebedee Iba, with cardiac ablation   LESION DESTRUCTION Bilateral 07/29/2017   Procedure: EXCISION LIP LESION;  Surgeon: Peggye Form, DO;  Location: [facility];  Service: Plastics;  Laterality: Bilateral;   LIPOMA EXCISION Right 07/29/2017   Procedure: EXCISION BACK LIPOMA;  Surgeon: Peggye Form, DO;  Location: [facility];  Service: Plastics;  Laterality: Right;   MAZE Left 05/07/2022   [facility] Dr.Gaca   TOTAL HIP ARTHROPLASTY Left 09/30/2018   Procedure: LEFT TOTAL HIP ARTHROPLASTY ANTERIOR APPROACH;  Surgeon: Kathryne Hitch, MD;  Location: [facility];  Service: Orthopedics;  Laterality: Left;   Social History   Occupational History   Occupation: retired  Tobacco Use   Smoking status: Former    Current packs/day: 0.00    Average packs/day: 1 pack/day for 30.0 years (30.0 ttl pk-yrs)    Types: Cigarettes    Start date: 07/14/1969    Quit date: 07/15/1999    Years since quitting: 24.2   Smokeless tobacco: Never  Vaping Use   Vaping status: Never Used  Substance and Sexual Activity   Alcohol use: Yes     Alcohol/week: 7.0 standard drinks of alcohol    Types: 5 Glasses of wine, 2 Shots of liquor per week    Comment: social, .5 per day   Drug use: No   Sexual activity: Not on file

## 2023-11-15 DIAGNOSIS — K080 Exfoliation of teeth due to systemic causes: Secondary | ICD-10-CM | POA: Diagnosis not present

## 2023-11-16 ENCOUNTER — Encounter: Payer: Self-pay | Admitting: Sports Medicine

## 2023-11-16 ENCOUNTER — Ambulatory Visit: Payer: Medicare Other | Admitting: Sports Medicine

## 2023-11-16 DIAGNOSIS — G8929 Other chronic pain: Secondary | ICD-10-CM | POA: Diagnosis not present

## 2023-11-16 DIAGNOSIS — R29898 Other symptoms and signs involving the musculoskeletal system: Secondary | ICD-10-CM

## 2023-11-16 DIAGNOSIS — M25552 Pain in left hip: Secondary | ICD-10-CM

## 2023-11-16 DIAGNOSIS — S76819A Strain of other specified muscles, fascia and tendons at thigh level, unspecified thigh, initial encounter: Secondary | ICD-10-CM

## 2023-11-16 DIAGNOSIS — S76812A Strain of other specified muscles, fascia and tendons at thigh level, left thigh, initial encounter: Secondary | ICD-10-CM | POA: Diagnosis not present

## 2023-11-16 NOTE — Progress Notes (Signed)
 Patient says that he is feeling about the same today as he was at his last visit. He says that he did get good relief from the injections for about 2 weeks, but is now having the same pain that he was having last time. He says that he has not done any of his exercises again after taking a couple of weeks of rest. He has pain in the front of the hip with hip flexion; he also gets pain when walking once he reaches about 1/4 of a mile if he is walking with longer strides. He has tried CBD oil which seems to be somewhat helpful.

## 2023-11-16 NOTE — Progress Notes (Signed)
 Keith Hernandez - 73 y.o. male MRN [id number]  Date of birth: 1950-10-15  Office Visit Note: Visit Date: 11/16/2023 PCP: Alysia Penna, MD Referred by: Alysia Penna, MD  Subjective: Chief Complaint  Patient presents with   Left Hip - Follow-up   HPI: Keith Hernandez is a pleasant 73 y.o. male who presents today for follow-up of left hip pain and flexor weakness.  Olanda unfortunately has had issues with the left anterior hip and hip flexion ever since his THA.  He has improved from prior treatment where we did extracorporeal shockwave therapy, nitroglycerin patch protocol, but he still has issues with weakness and insufficiency with hip flexion as well as pain associated with this.  He did get about 2 weeks of relief from the rectus femoris tendon injection we performed under ultrasound guidance at the end of January.  This has been the second time we have done this and each time he had good pain relief but still has some weakness, which points to the hip flexor musculature being the origin of his pain and limitation.  He has pulled back on the physical home exercise regimen at my discretion from last visit.  Has done some biking which felt good.  Pertinent ROS were reviewed with the patient and found to be negative unless otherwise specified above in HPI.   Assessment & Plan: Visit Diagnoses:  1. Weakness of left hip   2. Strain of rectus femoris muscle   3. Chronic left hip pain    Plan: I had a good discussion with Onalee Hua regarding his chronic left anterior hip pain and weakness.  We have performed extracorporeal shockwave therapy, nitroglycerin patch protocol and even injections into the rectus femoris and vastus lateralis hip flexor musculature with temporary relief of pain and some improvement in function.  This points to his soft tissue being the origin of his pain and not his actual hip replacement.  He has done physical therapy in the past as well as home exercise regimen, I would  like him to see Ellamae Sia for a second opinion on physical therapy to see if he has any additional thoughts or soft tissue modalities that can help him activate his hip flexors and improve his pain and function.  I would like Karron to begin stationary bicycle training 3 times weekly and then twice weekly his hip flexion exercises I have provided with him in the past.  I am hoping the cycling motion can help improve his hip flexion mobility and physical training.  After 2 or 3 sessions of PT with Ellamae Sia, I will see him in follow-up and we can discuss additional treatment modalities such as possibly further shockwave therapy. He is agreeable to this plan.  Follow-up: Return for f/u after 2-3 sessions of PT with O'Halloran.   Meds & Orders: No orders of the defined types were placed in this encounter.   Orders Placed This Encounter  Procedures   Ambulatory referral to Physical Therapy     Procedures: No procedures performed      Clinical History: MRI Left Hip IMPRESSION: 1. Status post total left hip arthroplasty. Within the limitation of metallic susceptibility artifact, no large left hip joint effusion, osteolysis or pseudotumor is identified. 2. Moderate right femoroacetabular osteoarthritis, greatest at the anterior superior aspect.     Electronically Signed   By: Neita Garnet M.D.   On: 12/23/2021 12:39  He reports that he quit smoking about 24 years ago. His smoking use included  cigarettes. He started smoking about 54 years ago. He has a 30 pack-year smoking history. He has never used smokeless tobacco. No results for input(s): "HGBA1C", "LABURIC" in the last 8760 hours.  Objective:    Physical Exam  Gen: Well-appearing, in no acute distress; non-toxic CV: Well-perfused. Warm.  Resp: Breathing unlabored on room air; no wheezing. Psych: Fluid speech in conversation; appropriate affect; normal thought process  Ortho Exam - Left hip: + TTP over the mid aspect of  the rectus femoris and vastus lateralis muscle belly.  He has 4/5 weakness with hip flexion compared to 5/5 of the contralateral hip.  The hip moves fluidly with internal and external rotation.  He has limited endrange flexion of the left hip with active range of motion.  Imaging:  09/23/23: XR HIP UNILAT W OR W/O PELVIS 2-3 VIEWS LEFT 3 views of the left hip including AP and lateral femoral ordered and  reviewed by myself.  There is a well-seated prosthesis without signs of  hardware failure.  Just lateral to the hip arthroplasty there is  calcification present over the anterior hip, which seems to reflect HO/MO  ossificans.  This is similar, it may be very slightly progressed from his  previous x-ray from 09/2021.    Past Medical/Family/Surgical/Social History: Medications & Allergies reviewed per EMR, new medications updated. 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   Irregular heart beat 04/03/2022   Atrial fibrillation (HCC) 04/03/2022   Atherosclerosis of coronary artery 04/03/2022   Foreign body in respiratory tract 02/05/2020   Status post total replacement of left hip 09/30/2018   Unilateral primary osteoarthritis, left hip 07/27/2018   Lipoma 03/23/2017   Dysfunction of right eustachian tube 11/26/2015   Seasonal allergic rhinitis 11/26/2015   Changing skin lesion 09/20/2015   Migraine, unspecified, not intractable, without status migrainosus 04/29/2015   Hyperlipidemia 04/29/2015   Gastro-esophageal reflux disease without esophagitis 04/29/2015   Essential hypertension 04/29/2015   Contracture of palmar fascia 04/29/2015   Abdominal aortic aneurysm without rupture (HCC) 04/29/2015   Past Medical History:  Diagnosis Date   AAA (abdominal aortic aneurysm) (HCC)    Anxiety    Arthritis     Bulging of cervical intervertebral disc    C6-7   Dupuytren's disease    GERD (gastroesophageal reflux disease)    OTC meds pnr   History of colon polyps    Hyperlipidemia    Hypertension    Migraines    Pneumonia    Sleep apnea    Family History  Problem Relation Age of Onset   Arthritis Mother    Heart disease Mother    Heart attack Mother    Asthma Father    Asthma Paternal Grandfather    Colon cancer Neg Hx    Colon polyps Neg Hx    Esophageal cancer Neg Hx    Rectal cancer Neg Hx    Stomach cancer Neg Hx    Past Surgical History:  Procedure Laterality Date   achilles reattachment     FASCIOTOMY Left 10/11/2015   Procedure: LEFT HAND SUBTOTAL PALMAR FASCIOTOMY WITH REPAIR RECONSTRUCTION ;  Surgeon: Dominica Severin, MD;  Location: [facility];  Service: Orthopedics;  Laterality: Left;   Left Atrial Mass Excision  Left 05/07/2022   [facility] Dr. Zebedee Iba, with cardiac  ablation   LESION DESTRUCTION Bilateral 07/29/2017   Procedure: EXCISION LIP LESION;  Surgeon: Peggye Form, DO;  Location: [facility];  Service: Plastics;  Laterality: Bilateral;   LIPOMA EXCISION Right 07/29/2017   Procedure: EXCISION BACK LIPOMA;  Surgeon: Peggye Form, DO;  Location: [facility];  Service: Plastics;  Laterality: Right;   MAZE Left 05/07/2022   [facility] Dr.Gaca   TOTAL HIP ARTHROPLASTY Left 09/30/2018   Procedure: LEFT TOTAL HIP ARTHROPLASTY ANTERIOR APPROACH;  Surgeon: Kathryne Hitch, MD;  Location: [facility];  Service: Orthopedics;  Laterality: Left;   Social History   Occupational History   Occupation: retired  Tobacco Use   Smoking status: Former    Current packs/day: 0.00    Average packs/day: 1 pack/day for 30.0 years (30.0 ttl pk-yrs)    Types: Cigarettes    Start date: 07/14/1969    Quit date: 07/15/1999    Years since quitting: 24.3   Smokeless tobacco: Never  Vaping Use   Vaping status: Never Used  Substance and  Sexual Activity   Alcohol use: Yes    Alcohol/week: 7.0 standard drinks of alcohol    Types: 5 Glasses of wine, 2 Shots of liquor per week    Comment: social, .5 per day   Drug use: No   Sexual activity: Not on file   I spent 35 minutes in the care of the patient today including face-to-face time, preparation to see the patient, as well as review of previous x-ray imaging, discussion on other advanced imaging (Metal-sparring MRI), need for PT, soft tissue treatment, and other alternative treatment for the above diagnoses.   Madelyn Brunner, DO Primary Care Sports Medicine Physician  [facility] - Orthopedics  This note was dictated using Dragon naturally speaking software and may contain errors in syntax, spelling, or content which have not been identified prior to signing this note.

## 2023-12-02 DIAGNOSIS — Z85828 Personal history of other malignant neoplasm of skin: Secondary | ICD-10-CM | POA: Diagnosis not present

## 2023-12-02 DIAGNOSIS — L218 Other seborrheic dermatitis: Secondary | ICD-10-CM | POA: Diagnosis not present

## 2023-12-08 DIAGNOSIS — M25552 Pain in left hip: Secondary | ICD-10-CM | POA: Diagnosis not present

## 2023-12-08 DIAGNOSIS — Z0100 Encounter for examination of eyes and vision without abnormal findings: Secondary | ICD-10-CM | POA: Diagnosis not present

## 2023-12-08 DIAGNOSIS — R269 Unspecified abnormalities of gait and mobility: Secondary | ICD-10-CM | POA: Diagnosis not present

## 2023-12-09 ENCOUNTER — Other Ambulatory Visit: Payer: Self-pay

## 2023-12-09 DIAGNOSIS — E782 Mixed hyperlipidemia: Secondary | ICD-10-CM

## 2023-12-09 MED ORDER — ROSUVASTATIN CALCIUM 5 MG PO TABS: 5.0000 mg | ORAL_TABLET | Freq: Every day | ORAL | 2 refills | Status: DC

## 2023-12-16 ENCOUNTER — Other Ambulatory Visit: Payer: Self-pay

## 2023-12-16 DIAGNOSIS — I7143 Infrarenal abdominal aortic aneurysm, without rupture: Secondary | ICD-10-CM

## 2023-12-20 DIAGNOSIS — M25552 Pain in left hip: Secondary | ICD-10-CM | POA: Diagnosis not present

## 2023-12-20 DIAGNOSIS — R269 Unspecified abnormalities of gait and mobility: Secondary | ICD-10-CM | POA: Diagnosis not present

## 2023-12-22 DIAGNOSIS — M5030 Other cervical disc degeneration, unspecified cervical region: Secondary | ICD-10-CM | POA: Diagnosis not present

## 2023-12-24 ENCOUNTER — Other Ambulatory Visit: Payer: Self-pay | Admitting: Adult Health

## 2023-12-24 DIAGNOSIS — M5030 Other cervical disc degeneration, unspecified cervical region: Secondary | ICD-10-CM

## 2023-12-24 DIAGNOSIS — M542 Cervicalgia: Secondary | ICD-10-CM

## 2023-12-24 DIAGNOSIS — R202 Paresthesia of skin: Secondary | ICD-10-CM

## 2023-12-27 DIAGNOSIS — M25552 Pain in left hip: Secondary | ICD-10-CM | POA: Diagnosis not present

## 2023-12-27 DIAGNOSIS — M542 Cervicalgia: Secondary | ICD-10-CM | POA: Diagnosis not present

## 2023-12-27 DIAGNOSIS — R269 Unspecified abnormalities of gait and mobility: Secondary | ICD-10-CM | POA: Diagnosis not present

## 2023-12-29 DIAGNOSIS — R269 Unspecified abnormalities of gait and mobility: Secondary | ICD-10-CM | POA: Diagnosis not present

## 2023-12-29 DIAGNOSIS — M542 Cervicalgia: Secondary | ICD-10-CM | POA: Diagnosis not present

## 2023-12-29 DIAGNOSIS — M25552 Pain in left hip: Secondary | ICD-10-CM | POA: Diagnosis not present

## 2023-12-31 ENCOUNTER — Other Ambulatory Visit

## 2024-01-03 ENCOUNTER — Other Ambulatory Visit

## 2024-01-03 ENCOUNTER — Ambulatory Visit
Admission: RE | Admit: 2024-01-03 | Discharge: 2024-01-03 | Disposition: A | Discharge status: Home / Self Care | Source: Ambulatory Visit | Attending: Adult Health | Admitting: Adult Health

## 2024-01-03 DIAGNOSIS — M542 Cervicalgia: Secondary | ICD-10-CM

## 2024-01-03 DIAGNOSIS — R202 Paresthesia of skin: Secondary | ICD-10-CM

## 2024-01-03 DIAGNOSIS — M5030 Other cervical disc degeneration, unspecified cervical region: Secondary | ICD-10-CM

## 2024-01-05 DIAGNOSIS — M25552 Pain in left hip: Secondary | ICD-10-CM | POA: Diagnosis not present

## 2024-01-05 DIAGNOSIS — R269 Unspecified abnormalities of gait and mobility: Secondary | ICD-10-CM | POA: Diagnosis not present

## 2024-01-05 DIAGNOSIS — M542 Cervicalgia: Secondary | ICD-10-CM | POA: Diagnosis not present

## 2024-01-07 DIAGNOSIS — M542 Cervicalgia: Secondary | ICD-10-CM | POA: Diagnosis not present

## 2024-01-07 DIAGNOSIS — R269 Unspecified abnormalities of gait and mobility: Secondary | ICD-10-CM | POA: Diagnosis not present

## 2024-01-07 DIAGNOSIS — M25552 Pain in left hip: Secondary | ICD-10-CM | POA: Diagnosis not present

## 2024-01-10 DIAGNOSIS — M25552 Pain in left hip: Secondary | ICD-10-CM | POA: Diagnosis not present

## 2024-01-10 DIAGNOSIS — R269 Unspecified abnormalities of gait and mobility: Secondary | ICD-10-CM | POA: Diagnosis not present

## 2024-01-10 DIAGNOSIS — M542 Cervicalgia: Secondary | ICD-10-CM | POA: Diagnosis not present

## 2024-01-11 ENCOUNTER — Other Ambulatory Visit

## 2024-01-11 DIAGNOSIS — M5412 Radiculopathy, cervical region: Secondary | ICD-10-CM | POA: Diagnosis not present

## 2024-01-11 DIAGNOSIS — Z6828 Body mass index (BMI) 28.0-28.9, adult: Secondary | ICD-10-CM | POA: Diagnosis not present

## 2024-01-12 ENCOUNTER — Other Ambulatory Visit

## 2024-01-12 ENCOUNTER — Ambulatory Visit
Admission: RE | Admit: 2024-01-12 | Discharge: 2024-01-12 | Discharge status: Home / Self Care | Source: Ambulatory Visit | Attending: Surgery | Admitting: Surgery

## 2024-01-12 DIAGNOSIS — I7143 Infrarenal abdominal aortic aneurysm, without rupture: Secondary | ICD-10-CM

## 2024-01-12 MED ORDER — IOPAMIDOL (ISOVUE-370) INJECTION 76%
500.0000 mL | Freq: Once | INTRAVENOUS | Status: AC | PRN
  Administered 2024-01-12: 75 mL via INTRAVENOUS

## 2024-01-17 ENCOUNTER — Encounter: Payer: Self-pay | Admitting: Surgery

## 2024-01-17 ENCOUNTER — Ambulatory Visit: Attending: Surgery | Admitting: Surgery

## 2024-01-17 VITALS — BP 160/96 | HR 68 | Temp 98.4°F | Ht 71.0 in | Wt 207.0 lb

## 2024-01-17 DIAGNOSIS — I7143 Infrarenal abdominal aortic aneurysm, without rupture: Secondary | ICD-10-CM | POA: Diagnosis not present

## 2024-01-17 DIAGNOSIS — M25552 Pain in left hip: Secondary | ICD-10-CM | POA: Diagnosis not present

## 2024-01-17 DIAGNOSIS — R269 Unspecified abnormalities of gait and mobility: Secondary | ICD-10-CM | POA: Diagnosis not present

## 2024-01-17 DIAGNOSIS — M542 Cervicalgia: Secondary | ICD-10-CM | POA: Diagnosis not present

## 2024-01-17 NOTE — Progress Notes (Signed)
 [facility] [place]  Patient name: Keith Hernandez MRN: [id number] DOB: 1951/07/02 Sex: male   REASON FOR VISIT:    Follow up  HISOTRY OF PRESENT ILLNESS:    Keith Hernandez is a 73 y.o. male who is back for follow-up of his abdominal aortic aneurysm.  It was last evaluated with CT scan in December 2023 where it measured 3.7 cm.  Ultrasound has not provided reliable measurements  He is status post resection of atrial myxoma at [facility] and has a persistently elevated right hemidiaphragm.  He is medically managed for hypertension with an ARB.  He is on a statin for hypercholesterolemia 3 times a week.  He is a former smoker.   PAST MEDICAL HISTORY:   Past Medical History:  Diagnosis Date   AAA (abdominal aortic aneurysm) (HCC)    Anxiety    Arthritis    Bulging of cervical intervertebral disc    C6-7   Dupuytren's disease    GERD (gastroesophageal reflux disease)    OTC meds pnr   History of colon polyps    Hyperlipidemia    Hypertension    Migraines    Pneumonia    Sleep apnea      FAMILY HISTORY:   Family History  Problem Relation Age of Onset   Arthritis Mother    Heart disease Mother    Heart attack Mother    Asthma Father    Asthma Paternal Grandfather    Colon cancer Neg Hx    Colon polyps Neg Hx    Esophageal cancer Neg Hx    Rectal cancer Neg Hx    Stomach cancer Neg Hx     SOCIAL HISTORY:   Social History   Tobacco Use   Smoking status: Former    Current packs/day: 0.00    Average packs/day: 1 pack/day for 30.0 years (30.0 ttl pk-yrs)    Types: Cigarettes    Start date: 07/14/1969    Quit date: 07/15/1999    Years since quitting: 24.5   Smokeless tobacco: Never  Substance Use Topics   Alcohol  use: Yes    Alcohol /week: 7.0 standard drinks of alcohol     Types: 5 Glasses of wine, 2 Shots of liquor per week    Comment: social, .5 per day     ALLERGIES:   Allergies  Allergen Reactions    Pollen Extract     Other reaction(s): Other (See Comments) Nasal drainage, itchy eyes   Atorvastatin     Other reaction(s): myalgia     CURRENT MEDICATIONS:   Current Outpatient Medications  Medication Sig Dispense Refill   carvedilol (COREG) 3.125 MG tablet Take 3.125 mg by mouth 2 (two) times daily.     Cholecalciferol (VITAMIN D3 PO) Take 1 capsule by mouth daily.     ELIQUIS  5 MG TABS tablet TAKE 1 TABLET BY MOUTH TWICE DAILY 180 tablet 1   flecainide  (TAMBOCOR ) 50 MG tablet TAKE 1 TABLET BY MOUTH TWICE DAILY 60 tablet 11   LORazepam (ATIVAN) 0.5 MG tablet Take 0.5 mg by mouth 2 (two) times daily as needed.     metoprolol  succinate (TOPROL -XL) 50 MG 24 hr tablet Take 1 tablet (50 mg total) by mouth daily. Take with or immediately following a meal. 90 tablet 3   Multiple Vitamin (MULTIVITAMIN) tablet Take 1 tablet by mouth daily.     rosuvastatin  (CRESTOR ) 5 MG tablet Take 1 tablet (5 mg total) by mouth daily. 90 tablet 2   triamcinolone  (  NASACORT) 55 MCG/ACT AERO nasal inhaler Place 1 spray into the nose daily as needed (for congestion).     No current facility-administered medications for this visit.    REVIEW OF SYSTEMS:   [X]  denotes positive finding, [ ]  denotes negative finding Cardiac  Comments:  Chest pain or chest pressure:    Shortness of breath upon exertion:    Short of breath when lying flat:    Irregular heart rhythm:        Vascular    Pain in calf, thigh, or hip brought on by ambulation:    Pain in feet at night that wakes you up from your sleep:     Blood clot in your veins:    Leg swelling:         Pulmonary    Oxygen  at home:    Productive cough:     Wheezing:         Neurologic    Sudden weakness in arms or legs:     Sudden numbness in arms or legs:     Sudden onset of difficulty speaking or slurred speech:    Temporary loss of vision in one eye:     Problems with dizziness:         Gastrointestinal    Blood in stool:     Vomited blood:          Genitourinary    Burning when urinating:     Blood in urine:        Psychiatric    Major depression:         Hematologic    Bleeding problems:    Problems with blood clotting too easily:        Skin    Rashes or ulcers:        Constitutional    Fever or chills:      PHYSICAL EXAM:   Vitals:   01/17/24 0935  BP: (!) 160/96  Pulse: 68  Temp: 98.4 F (36.9 C)  SpO2: 95%  Weight: 207 lb (93.9 kg)  Height: 5\' 11"  (1.803 m)    GENERAL: The patient is a well-nourished male, in no acute distress. The vital signs are documented above. CARDIAC: There is a regular rate and rhythm.  PULMONARY: Non-labored respirations ABDOMEN: Soft and non-tender   MUSCULOSKELETAL: There are no major deformities or cyanosis. NEUROLOGIC: No focal weakness or paresthesias are detected. SKIN: There are no ulcers or rashes noted. PSYCHIATRIC: The patient has a normal affect.  STUDIES:   I have reviewed his CT scan with the following findings:  Stable 3.2 cm infrarenal abdominal aortic aneurysm. Recommend follow-up in 3 years.  MEDICAL ISSUES:   AAA: No significant change and his infrarenal aneurysm with maximum diameter 3.2 cm.  I will follow-up with ultrasound in 2 years.  If there is any concern about the measurements on ultrasound, he will need another CT scan.    Marti Slates, MD, FACS [facility] [phone] Pager [phone]

## 2024-02-10 DIAGNOSIS — M5412 Radiculopathy, cervical region: Secondary | ICD-10-CM | POA: Diagnosis not present

## 2024-02-16 DIAGNOSIS — Z85828 Personal history of other malignant neoplasm of skin: Secondary | ICD-10-CM | POA: Diagnosis not present

## 2024-02-16 DIAGNOSIS — L821 Other seborrheic keratosis: Secondary | ICD-10-CM | POA: Diagnosis not present

## 2024-02-16 DIAGNOSIS — L814 Other melanin hyperpigmentation: Secondary | ICD-10-CM | POA: Diagnosis not present

## 2024-02-16 DIAGNOSIS — I788 Other diseases of capillaries: Secondary | ICD-10-CM | POA: Diagnosis not present

## 2024-02-16 DIAGNOSIS — R269 Unspecified abnormalities of gait and mobility: Secondary | ICD-10-CM | POA: Diagnosis not present

## 2024-02-16 DIAGNOSIS — M542 Cervicalgia: Secondary | ICD-10-CM | POA: Diagnosis not present

## 2024-02-16 DIAGNOSIS — L570 Actinic keratosis: Secondary | ICD-10-CM | POA: Diagnosis not present

## 2024-02-16 DIAGNOSIS — M25552 Pain in left hip: Secondary | ICD-10-CM | POA: Diagnosis not present

## 2024-02-17 DIAGNOSIS — M542 Cervicalgia: Secondary | ICD-10-CM | POA: Diagnosis not present

## 2024-02-17 DIAGNOSIS — M25552 Pain in left hip: Secondary | ICD-10-CM | POA: Diagnosis not present

## 2024-02-17 DIAGNOSIS — R269 Unspecified abnormalities of gait and mobility: Secondary | ICD-10-CM | POA: Diagnosis not present

## 2024-02-18 ENCOUNTER — Other Ambulatory Visit: Payer: Self-pay | Admitting: Cardiovascular Disease

## 2024-02-18 DIAGNOSIS — I499 Cardiac arrhythmia, unspecified: Secondary | ICD-10-CM

## 2024-02-22 DIAGNOSIS — M25552 Pain in left hip: Secondary | ICD-10-CM | POA: Diagnosis not present

## 2024-02-22 DIAGNOSIS — R269 Unspecified abnormalities of gait and mobility: Secondary | ICD-10-CM | POA: Diagnosis not present

## 2024-02-22 DIAGNOSIS — M542 Cervicalgia: Secondary | ICD-10-CM | POA: Diagnosis not present

## 2024-02-24 DIAGNOSIS — M542 Cervicalgia: Secondary | ICD-10-CM | POA: Diagnosis not present

## 2024-02-24 DIAGNOSIS — M25552 Pain in left hip: Secondary | ICD-10-CM | POA: Diagnosis not present

## 2024-02-24 DIAGNOSIS — R269 Unspecified abnormalities of gait and mobility: Secondary | ICD-10-CM | POA: Diagnosis not present

## 2024-03-03 DIAGNOSIS — M542 Cervicalgia: Secondary | ICD-10-CM | POA: Diagnosis not present

## 2024-03-03 DIAGNOSIS — R269 Unspecified abnormalities of gait and mobility: Secondary | ICD-10-CM | POA: Diagnosis not present

## 2024-03-03 DIAGNOSIS — M25552 Pain in left hip: Secondary | ICD-10-CM | POA: Diagnosis not present

## 2024-03-09 DIAGNOSIS — Z6827 Body mass index (BMI) 27.0-27.9, adult: Secondary | ICD-10-CM | POA: Diagnosis not present

## 2024-03-09 DIAGNOSIS — M5412 Radiculopathy, cervical region: Secondary | ICD-10-CM | POA: Diagnosis not present

## 2024-03-22 DIAGNOSIS — M542 Cervicalgia: Secondary | ICD-10-CM | POA: Diagnosis not present

## 2024-03-23 DIAGNOSIS — K080 Exfoliation of teeth due to systemic causes: Secondary | ICD-10-CM | POA: Diagnosis not present

## 2024-04-03 DIAGNOSIS — M542 Cervicalgia: Secondary | ICD-10-CM | POA: Diagnosis not present

## 2024-04-19 DIAGNOSIS — M542 Cervicalgia: Secondary | ICD-10-CM | POA: Diagnosis not present

## 2024-05-17 ENCOUNTER — Other Ambulatory Visit: Payer: Self-pay | Admitting: Cardiovascular Disease

## 2024-05-17 DIAGNOSIS — I499 Cardiac arrhythmia, unspecified: Secondary | ICD-10-CM

## 2024-05-24 DIAGNOSIS — M542 Cervicalgia: Secondary | ICD-10-CM | POA: Diagnosis not present

## 2024-05-24 DIAGNOSIS — M25552 Pain in left hip: Secondary | ICD-10-CM | POA: Diagnosis not present

## 2024-05-25 ENCOUNTER — Other Ambulatory Visit: Payer: Self-pay | Admitting: Cardiovascular Disease

## 2024-05-25 DIAGNOSIS — I499 Cardiac arrhythmia, unspecified: Secondary | ICD-10-CM

## 2024-06-20 DIAGNOSIS — M25552 Pain in left hip: Secondary | ICD-10-CM | POA: Diagnosis not present

## 2024-06-20 DIAGNOSIS — M5440 Lumbago with sciatica, unspecified side: Secondary | ICD-10-CM | POA: Diagnosis not present

## 2024-06-20 DIAGNOSIS — M542 Cervicalgia: Secondary | ICD-10-CM | POA: Diagnosis not present

## 2024-06-29 DIAGNOSIS — M545 Low back pain, unspecified: Secondary | ICD-10-CM | POA: Diagnosis not present

## 2024-06-29 DIAGNOSIS — M25552 Pain in left hip: Secondary | ICD-10-CM | POA: Diagnosis not present

## 2024-06-29 DIAGNOSIS — M542 Cervicalgia: Secondary | ICD-10-CM | POA: Diagnosis not present

## 2024-06-29 NOTE — Progress Notes (Signed)
 Keith Hernandez                                          MRN: [id number]   06/29/2024   The [facility] Quality Team Specialist reviewed this patient medical record for the purposes of chart review for care gap closure. The following were reviewed: chart review for care gap closure-controlling blood pressure.    [facility] Quality Team

## 2024-07-12 DIAGNOSIS — L03012 Cellulitis of left finger: Secondary | ICD-10-CM | POA: Diagnosis not present

## 2024-07-17 ENCOUNTER — Encounter: Payer: Self-pay | Admitting: Radiology

## 2024-07-26 ENCOUNTER — Other Ambulatory Visit: Payer: Self-pay | Admitting: Cardiovascular Disease

## 2024-07-26 DIAGNOSIS — I480 Paroxysmal atrial fibrillation: Secondary | ICD-10-CM

## 2024-07-26 MED ORDER — APIXABAN 5 MG PO TABS: 5.0000 mg | ORAL_TABLET | Freq: Two times a day (BID) | ORAL | 0 refills | Status: AC

## 2024-07-26 NOTE — Telephone Encounter (Signed)
 Prescription refill request for Eliquis  received. Indication: A-fib Last office visit: 07/21/2023  Scr: 1.1 (PCP EPIC 08/10/2023) Age: 73 Weight: 93kg  Scheduled patient for 11yr follow up 09/28/2024, refill sent in through appointment date

## 2024-07-27 DIAGNOSIS — M542 Cervicalgia: Secondary | ICD-10-CM | POA: Diagnosis not present

## 2024-07-27 DIAGNOSIS — M545 Low back pain, unspecified: Secondary | ICD-10-CM | POA: Diagnosis not present

## 2024-07-27 DIAGNOSIS — M25552 Pain in left hip: Secondary | ICD-10-CM | POA: Diagnosis not present

## 2024-08-03 DIAGNOSIS — M5440 Lumbago with sciatica, unspecified side: Secondary | ICD-10-CM | POA: Diagnosis not present

## 2024-08-03 DIAGNOSIS — Z6828 Body mass index (BMI) 28.0-28.9, adult: Secondary | ICD-10-CM | POA: Diagnosis not present

## 2024-08-29 ENCOUNTER — Other Ambulatory Visit: Payer: Self-pay | Admitting: Cardiovascular Disease

## 2024-08-29 ENCOUNTER — Other Ambulatory Visit: Payer: Self-pay

## 2024-08-29 DIAGNOSIS — E782 Mixed hyperlipidemia: Secondary | ICD-10-CM

## 2024-08-29 MED ORDER — ROSUVASTATIN CALCIUM 5 MG PO TABS: 5.0000 mg | ORAL_TABLET | Freq: Every day | ORAL | 0 refills | Status: DC

## 2024-09-01 ENCOUNTER — Other Ambulatory Visit: Payer: Self-pay | Admitting: Cardiovascular Disease

## 2024-09-01 DIAGNOSIS — E782 Mixed hyperlipidemia: Secondary | ICD-10-CM

## 2024-09-21 ENCOUNTER — Other Ambulatory Visit: Payer: Self-pay | Admitting: Cardiovascular Disease

## 2024-09-21 DIAGNOSIS — I499 Cardiac arrhythmia, unspecified: Secondary | ICD-10-CM

## 2024-09-22 LAB — BUN+CREAT
BUN/Creatinine Ratio: 13 (ref 10–24)
BUN: 16 mg/dL (ref 8–27)
Creatinine, Ser: 1.19 mg/dL (ref 0.76–1.27)
eGFR: 64 mL/min/1.73

## 2024-09-22 NOTE — Progress Notes (Unsigned)
" °  Cardiology Office Note:  .   Date:  09/22/2024  ID:  Keith Hernandez, DOB Nov 24, 1950, MRN [id number] PCP: Larnell Hamilton, MD  [facility] Providers Cardiologist:  Darryle ONEIDA Decent, MD   History of Present Illness: .   No chief complaint on file.   Keith Hernandez is a 74 y.o. male with below history who presents for follow-up.   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 Paroxysmal Afib -surgical PVI 8/24/82023 3. Coronary calcium  11 (20th percentile) -<25% stenosis CCTA 03/11/2022 4. HLD -T chol 126, HDL 43, LDL 71, TG 62 5. HTN 6. AAA -3.7 cm    ROS: All other ROS reviewed and negative. Pertinent positives noted in the HPI.     Studies Reviewed: SABRA       TTE 08/24/2023  1. Left ventricular ejection fraction, by estimation, is 55 to 60%. The  left ventricle has normal function. The left ventricle has no regional  wall motion abnormalities. Left ventricular diastolic parameters were  normal. The average left ventricular  global longitudinal strain is -16.5 %. The global longitudinal strain is  normal.   2. Right ventricular systolic function is normal. The right ventricular  size is normal. Tricuspid regurgitation signal is inadequate for assessing  PA pressure.   3. The mitral valve is grossly normal. Mild mitral valve regurgitation.  No evidence of mitral stenosis.   4. The aortic valve is tricuspid. Aortic valve regurgitation is not  visualized. No aortic stenosis is present.   5. The inferior vena cava is normal in size with greater than 50%  respiratory variability, suggesting right atrial pressure of 3 mmHg.    Physical Exam:   VS:  There were no vitals taken for this visit.   Wt Readings from Last 3 Encounters:  01/17/24 207 lb (93.9 kg)  07/21/23 204 lb 12.8 oz (92.9 kg)  03/04/23 196 lb (88.9 kg)    GEN: Well nourished, well developed in no acute distress NECK: No JVD; No  carotid bruits CARDIAC: ***RRR, no murmurs, rubs, gallops RESPIRATORY:  Clear to auscultation without rales, wheezing or rhonchi  ABDOMEN: Soft, non-tender, non-distended EXTREMITIES:  No edema; No deformity  ASSESSMENT AND PLAN: .   Assessment and Plan                 {Are you ordering a CV Procedure (e.g. stress test, cath, DCCV, TEE, etc)?   Press F2        :[id number]}   Follow-up: No follow-ups on file.  Signed, Darryle ONEIDA. Decent, MD, [facility]  [facility]  [address] [place], [place] [address] [phone]  12:21 PM   "

## 2024-09-28 ENCOUNTER — Ambulatory Visit: Attending: Cardiovascular Disease | Admitting: Cardiovascular Disease

## 2024-09-28 ENCOUNTER — Encounter: Payer: Self-pay | Admitting: Cardiovascular Disease

## 2024-09-28 VITALS — BP 150/88 | HR 64 | Ht 71.0 in | Wt 211.0 lb

## 2024-09-28 DIAGNOSIS — D151 Benign neoplasm of heart: Secondary | ICD-10-CM

## 2024-09-28 DIAGNOSIS — E782 Mixed hyperlipidemia: Secondary | ICD-10-CM | POA: Diagnosis not present

## 2024-09-28 DIAGNOSIS — I10 Essential (primary) hypertension: Secondary | ICD-10-CM

## 2024-09-28 DIAGNOSIS — E0849 Diabetes mellitus due to underlying condition with other diabetic neurological complication: Secondary | ICD-10-CM | POA: Insufficient documentation

## 2024-09-28 DIAGNOSIS — I480 Paroxysmal atrial fibrillation: Secondary | ICD-10-CM | POA: Diagnosis not present

## 2024-09-28 DIAGNOSIS — G4733 Obstructive sleep apnea (adult) (pediatric): Secondary | ICD-10-CM | POA: Diagnosis not present

## 2024-09-28 MED ORDER — LOSARTAN POTASSIUM 25 MG PO TABS: 25.0000 mg | ORAL_TABLET | Freq: Every day | ORAL | 3 refills | Status: AC

## 2024-09-28 NOTE — Patient Instructions (Signed)
 Medication Instructions:  Your physician has recommended you make the following change in your medication:   -Start taking losartan  (Cozaar ) 25mg  once daily.  *If you need a refill on your cardiac medications before your next appointment, please call your pharmacy*   Testing/Procedures: Your physician has requested that you have an echocardiogram. Echocardiography is a painless test that uses sound waves to create images of your heart. It provides your doctor with information about the size and shape of your heart and how well your hearts chambers and valves are working. This procedure takes approximately one hour. There are no restrictions for this procedure. Please do NOT wear cologne, perfume, aftershave, or lotions (deodorant is allowed). Please arrive 15 minutes prior to your appointment time.  Please note: We ask at that you not bring children with you during ultrasound (echo/ vascular) testing. Due to room size and safety concerns, children are not allowed in the ultrasound rooms during exams. Our front office staff cannot provide observation of children in our lobby area while testing is being conducted. An adult accompanying a patient to their appointment will only be allowed in the ultrasound room at the discretion of the ultrasound technician under special circumstances. We apologize for any inconvenience.   Follow-Up: At [facility], you and your health needs are our priority.  As part of our continuing mission to provide you with exceptional heart care, our providers are all part of one team.  This team includes your primary Cardiologist (physician) and Advanced Practice Providers or APPs (Physician Assistants and Nurse Practitioners) who all work together to provide you with the care you need, when you need it.  Your next appointment:   12 month(s)  Provider:   Darryle ONEIDA Decent, MD    We recommend signing up for the patient portal called MyChart.  Sign up  information is provided on this After Visit Summary.  MyChart is used to connect with patients for Virtual Visits (Telemedicine).  Patients are able to view lab/test results, encounter notes, upcoming appointments, etc.  Non-urgent messages can be sent to your provider as well.   To learn more about what you can do with MyChart, go to [email or web address].   Other Instructions

## 2024-09-29 ENCOUNTER — Other Ambulatory Visit: Payer: Self-pay | Admitting: Cardiovascular Disease

## 2024-09-29 ENCOUNTER — Encounter: Payer: Self-pay | Admitting: Cardiovascular Disease

## 2024-09-29 DIAGNOSIS — I499 Cardiac arrhythmia, unspecified: Secondary | ICD-10-CM

## 2024-10-02 ENCOUNTER — Other Ambulatory Visit: Payer: Self-pay | Admitting: Cardiovascular Disease

## 2024-10-02 DIAGNOSIS — I499 Cardiac arrhythmia, unspecified: Secondary | ICD-10-CM

## 2024-10-02 MED ORDER — METOPROLOL SUCCINATE ER 50 MG PO TB24: 50.0000 mg | ORAL_TABLET | Freq: Every day | ORAL | 0 refills | Status: AC

## 2024-10-02 MED ORDER — METOPROLOL SUCCINATE ER 50 MG PO TB24: 50.0000 mg | ORAL_TABLET | Freq: Every day | ORAL | 11 refills | Status: AC

## 2024-10-02 NOTE — Telephone Encounter (Signed)
 In accordance with refill protocols, please review and address the following requirements before this medication refill can be authorized:  Labs   Labs needed for K to be within 180 days within a normal range

## 2024-10-03 NOTE — Telephone Encounter (Signed)
 Patient is calling up to follow up with concerns    Call back Number: [phone]

## 2024-10-04 ENCOUNTER — Ambulatory Visit (HOSPITAL_BASED_OUTPATIENT_CLINIC_OR_DEPARTMENT_OTHER)

## 2024-11-01 ENCOUNTER — Ambulatory Visit (HOSPITAL_COMMUNITY)
